# Patient Record
Sex: Female | Born: 1975 | Race: White | Hispanic: No | Marital: Married | Smoking: Former smoker
Health system: Southern US, Community
[De-identification: ages and names within clinical notes are randomized; demographics above are authoritative.]

## PROBLEM LIST (undated history)

## (undated) DIAGNOSIS — K589 Irritable bowel syndrome without diarrhea: Secondary | ICD-10-CM

## (undated) DIAGNOSIS — E039 Hypothyroidism, unspecified: Secondary | ICD-10-CM

## (undated) DIAGNOSIS — K219 Gastro-esophageal reflux disease without esophagitis: Secondary | ICD-10-CM

## (undated) DIAGNOSIS — F329 Major depressive disorder, single episode, unspecified: Secondary | ICD-10-CM

## (undated) DIAGNOSIS — G43909 Migraine, unspecified, not intractable, without status migrainosus: Secondary | ICD-10-CM

## (undated) DIAGNOSIS — F32A Depression, unspecified: Secondary | ICD-10-CM

## (undated) DIAGNOSIS — F3289 Other specified depressive episodes: Secondary | ICD-10-CM

## (undated) DIAGNOSIS — F410 Panic disorder [episodic paroxysmal anxiety] without agoraphobia: Secondary | ICD-10-CM

## (undated) DIAGNOSIS — F419 Anxiety disorder, unspecified: Secondary | ICD-10-CM

## (undated) DIAGNOSIS — O24419 Gestational diabetes mellitus in pregnancy, unspecified control: Secondary | ICD-10-CM

## (undated) DIAGNOSIS — R1110 Vomiting, unspecified: Secondary | ICD-10-CM

## (undated) DIAGNOSIS — R197 Diarrhea, unspecified: Secondary | ICD-10-CM

## (undated) HISTORY — PX: APPENDECTOMY: SHX54

## (undated) HISTORY — DX: Vomiting, unspecified: R11.10

## (undated) HISTORY — DX: Gastro-esophageal reflux disease without esophagitis: K21.9

## (undated) HISTORY — DX: Irritable bowel syndrome, unspecified: K58.9

## (undated) HISTORY — DX: Major depressive disorder, single episode, unspecified: F32.9

## (undated) HISTORY — DX: Migraine, unspecified, not intractable, without status migrainosus: G43.909

## (undated) HISTORY — PX: WISDOM TOOTH EXTRACTION: SHX21

## (undated) HISTORY — DX: Panic disorder (episodic paroxysmal anxiety): F41.0

## (undated) HISTORY — DX: Other specified depressive episodes: F32.89

## (undated) HISTORY — PX: COLON SURGERY: SHX602

## (undated) HISTORY — DX: Anxiety disorder, unspecified: F41.9

## (undated) HISTORY — DX: Diarrhea, unspecified: R19.7

---

## 2000-06-30 ENCOUNTER — Inpatient Hospital Stay (HOSPITAL_COMMUNITY): Admission: AD | Admit: 2000-06-30 | Discharge: 2000-06-30 | Payer: Self-pay | Admitting: *Deleted

## 2000-06-30 ENCOUNTER — Encounter: Payer: Self-pay | Admitting: *Deleted

## 2001-04-03 ENCOUNTER — Emergency Department (HOSPITAL_COMMUNITY): Admission: EM | Admit: 2001-04-03 | Discharge: 2001-04-03 | Payer: Self-pay | Admitting: Family Medicine

## 2001-04-03 ENCOUNTER — Encounter: Payer: Self-pay | Admitting: Emergency Medicine

## 2001-08-16 ENCOUNTER — Other Ambulatory Visit: Admission: RE | Admit: 2001-08-16 | Discharge: 2001-08-16 | Payer: Self-pay | Admitting: *Deleted

## 2002-10-13 ENCOUNTER — Other Ambulatory Visit: Admission: RE | Admit: 2002-10-13 | Discharge: 2002-10-13 | Payer: Self-pay | Admitting: *Deleted

## 2003-12-18 ENCOUNTER — Ambulatory Visit (HOSPITAL_COMMUNITY): Admission: RE | Admit: 2003-12-18 | Discharge: 2003-12-18 | Payer: Self-pay | Admitting: Internal Medicine

## 2004-02-22 ENCOUNTER — Encounter (INDEPENDENT_AMBULATORY_CARE_PROVIDER_SITE_OTHER): Payer: Self-pay | Admitting: *Deleted

## 2004-02-23 ENCOUNTER — Encounter (INDEPENDENT_AMBULATORY_CARE_PROVIDER_SITE_OTHER): Payer: Self-pay | Admitting: Specialist

## 2004-02-23 ENCOUNTER — Encounter (INDEPENDENT_AMBULATORY_CARE_PROVIDER_SITE_OTHER): Payer: Self-pay | Admitting: *Deleted

## 2004-02-23 ENCOUNTER — Observation Stay (HOSPITAL_COMMUNITY): Admission: EM | Admit: 2004-02-23 | Discharge: 2004-02-23 | Payer: Self-pay | Admitting: Emergency Medicine

## 2004-11-20 ENCOUNTER — Other Ambulatory Visit: Admission: RE | Admit: 2004-11-20 | Discharge: 2004-11-20 | Payer: Self-pay | Admitting: Obstetrics and Gynecology

## 2009-09-01 HISTORY — PX: COLONOSCOPY: SHX174

## 2010-04-03 ENCOUNTER — Encounter (INDEPENDENT_AMBULATORY_CARE_PROVIDER_SITE_OTHER): Payer: Self-pay | Admitting: *Deleted

## 2010-04-26 ENCOUNTER — Encounter (INDEPENDENT_AMBULATORY_CARE_PROVIDER_SITE_OTHER): Payer: Self-pay | Admitting: *Deleted

## 2010-04-29 ENCOUNTER — Encounter (INDEPENDENT_AMBULATORY_CARE_PROVIDER_SITE_OTHER): Payer: Self-pay | Admitting: *Deleted

## 2010-04-30 ENCOUNTER — Encounter (INDEPENDENT_AMBULATORY_CARE_PROVIDER_SITE_OTHER): Payer: Self-pay | Admitting: *Deleted

## 2010-05-08 ENCOUNTER — Encounter (INDEPENDENT_AMBULATORY_CARE_PROVIDER_SITE_OTHER): Payer: Self-pay | Admitting: *Deleted

## 2010-05-17 DIAGNOSIS — R1110 Vomiting, unspecified: Secondary | ICD-10-CM | POA: Insufficient documentation

## 2010-05-17 DIAGNOSIS — F329 Major depressive disorder, single episode, unspecified: Secondary | ICD-10-CM | POA: Insufficient documentation

## 2010-05-17 DIAGNOSIS — F410 Panic disorder [episodic paroxysmal anxiety] without agoraphobia: Secondary | ICD-10-CM | POA: Insufficient documentation

## 2010-05-17 DIAGNOSIS — K589 Irritable bowel syndrome without diarrhea: Secondary | ICD-10-CM | POA: Insufficient documentation

## 2010-05-17 DIAGNOSIS — R197 Diarrhea, unspecified: Secondary | ICD-10-CM | POA: Insufficient documentation

## 2010-05-17 DIAGNOSIS — K219 Gastro-esophageal reflux disease without esophagitis: Secondary | ICD-10-CM | POA: Insufficient documentation

## 2010-05-17 DIAGNOSIS — F3289 Other specified depressive episodes: Secondary | ICD-10-CM | POA: Insufficient documentation

## 2010-05-20 ENCOUNTER — Ambulatory Visit: Payer: Self-pay | Admitting: Internal Medicine

## 2010-05-21 ENCOUNTER — Telehealth: Payer: Self-pay | Admitting: Internal Medicine

## 2010-05-21 LAB — CONVERTED CEMR LAB
Sed Rate: 9 mm/hr (ref 0–22)
TSH: 1.71 microintl units/mL (ref 0.35–5.50)
Tissue Transglutaminase Ab, IgA: 10.3 units (ref <20)

## 2010-05-23 ENCOUNTER — Ambulatory Visit: Payer: Self-pay | Admitting: Internal Medicine

## 2010-05-28 ENCOUNTER — Encounter: Payer: Self-pay | Admitting: Internal Medicine

## 2010-06-03 ENCOUNTER — Telehealth: Payer: Self-pay | Admitting: Internal Medicine

## 2010-06-11 ENCOUNTER — Telehealth: Payer: Self-pay | Admitting: Internal Medicine

## 2010-08-07 ENCOUNTER — Ambulatory Visit: Payer: Self-pay | Admitting: Internal Medicine

## 2010-08-07 DIAGNOSIS — G43909 Migraine, unspecified, not intractable, without status migrainosus: Secondary | ICD-10-CM | POA: Insufficient documentation

## 2010-08-07 DIAGNOSIS — R740 Nonspecific elevation of levels of transaminase and lactic acid dehydrogenase [LDH]: Secondary | ICD-10-CM

## 2010-08-07 DIAGNOSIS — R7402 Elevation of levels of lactic acid dehydrogenase (LDH): Secondary | ICD-10-CM | POA: Insufficient documentation

## 2010-08-08 ENCOUNTER — Telehealth: Payer: Self-pay | Admitting: Internal Medicine

## 2010-08-08 ENCOUNTER — Telehealth (INDEPENDENT_AMBULATORY_CARE_PROVIDER_SITE_OTHER): Payer: Self-pay | Admitting: *Deleted

## 2010-08-08 LAB — CONVERTED CEMR LAB
ALT: 53 units/L — ABNORMAL HIGH (ref 0–35)
AST: 44 units/L — ABNORMAL HIGH (ref 0–37)
Albumin: 4.2 g/dL (ref 3.5–5.2)
Alkaline Phosphatase: 96 units/L (ref 39–117)
BUN: 7 mg/dL (ref 6–23)
Basophils Absolute: 0 10*3/uL (ref 0.0–0.1)
Basophils Relative: 0.5 % (ref 0.0–3.0)
CO2: 30 meq/L (ref 19–32)
Calcium: 9.8 mg/dL (ref 8.4–10.5)
Chloride: 99 meq/L (ref 96–112)
Creatinine, Ser: 0.6 mg/dL (ref 0.4–1.2)
Eosinophils Absolute: 0.2 10*3/uL (ref 0.0–0.7)
Eosinophils Relative: 2.1 % (ref 0.0–5.0)
GFR calc non Af Amer: 126.76 mL/min (ref >60.00)
Glucose, Bld: 87 mg/dL (ref 70–99)
HCT: 39.3 % (ref 36.0–46.0)
Hemoglobin: 13.4 g/dL (ref 12.0–15.0)
Lymphocytes Relative: 34.2 % (ref 12.0–46.0)
Lymphs Abs: 2.7 10*3/uL (ref 0.7–4.0)
MCHC: 34.2 g/dL (ref 30.0–36.0)
MCV: 97 fL (ref 78.0–100.0)
Monocytes Absolute: 0.6 10*3/uL (ref 0.1–1.0)
Monocytes Relative: 7.7 % (ref 3.0–12.0)
Neutro Abs: 4.4 10*3/uL (ref 1.4–7.7)
Neutrophils Relative %: 55.5 % (ref 43.0–77.0)
Platelets: 321 10*3/uL (ref 150.0–400.0)
Potassium: 4.6 meq/L (ref 3.5–5.1)
RBC: 4.05 M/uL (ref 3.87–5.11)
RDW: 12.8 % (ref 11.5–14.6)
Sed Rate: 10 mm/hr (ref 0–22)
Sodium: 137 meq/L (ref 135–145)
TSH: 6.12 microintl units/mL — ABNORMAL HIGH (ref 0.35–5.50)
Total Bilirubin: 0.3 mg/dL (ref 0.3–1.2)
Total Protein: 7.1 g/dL (ref 6.0–8.3)
WBC: 7.9 10*3/uL (ref 4.5–10.5)

## 2010-08-27 ENCOUNTER — Encounter: Payer: Self-pay | Admitting: Internal Medicine

## 2010-08-27 LAB — CONVERTED CEMR LAB
Anti Nuclear Antibody(ANA): NEGATIVE
Ceruloplasmin: 24 mg/dL (ref 21–63)
HCV Ab: NEGATIVE
Hep B Core Total Ab: NEGATIVE
Hep B S Ab: NEGATIVE
Hepatitis B Surface Ag: NEGATIVE

## 2010-08-28 ENCOUNTER — Ambulatory Visit: Payer: Self-pay | Admitting: Internal Medicine

## 2010-08-29 LAB — CONVERTED CEMR LAB
ALT: 37 units/L — ABNORMAL HIGH (ref 0–35)
AST: 28 units/L (ref 0–37)
Albumin: 3.8 g/dL (ref 3.5–5.2)
Alkaline Phosphatase: 64 units/L (ref 39–117)
Bilirubin, Direct: 0.1 mg/dL (ref 0.0–0.3)
Ferritin: 164.1 ng/mL (ref 10.0–291.0)
TSH: 1.82 microintl units/mL (ref 0.35–5.50)
Total Bilirubin: 0.4 mg/dL (ref 0.3–1.2)
Total Protein: 6.5 g/dL (ref 6.0–8.3)

## 2010-10-03 NOTE — Letter (Signed)
Summary: [facility]   Imported By: Lamona Curl CMA (AAMA) 05/17/2010 09:50:38  _____________________________________________________________________  External Attachment:    Type:   Image     Comment:   External Document

## 2010-10-03 NOTE — Letter (Signed)
Summary: [facility] Instructions  [facility]  [address] [place], [place] [address]   Phone: [phone]  Fax: [phone]       Lori Marshall    [birth year]    MRN: [id number]       Procedure Day /Date: 05/23/10 Thursday     Arrival Time: 2:30 pm     Procedure Time: 3:30 pm     Location of Procedure:                    _ x_  [facility] (4th Floor)  PREPARATION FOR COLONOSCOPY WITH MIRALAX  Starting 5 days prior to your procedure (05/19/10) do not eat nuts, seeds, popcorn, corn, beans, peas,  salads, or any raw vegetables.  Do not take any fiber supplements (e.g. Metamucil, Citrucel, and Benefiber). ____________________________________________________________________________________________________   THE DAY BEFORE YOUR PROCEDURE         DATE: 05/22/10 DAY: Wednesday  1   Drink clear liquids the entire day-NO SOLID FOOD  2   Do not drink anything colored red or purple.  Avoid juices with pulp.  No orange juice.  3   Drink at least 64 oz. (8 glasses) of fluid/clear liquids during the day to prevent dehydration and help the prep work efficiently.  CLEAR LIQUIDS INCLUDE: Water Jello Ice Popsicles Tea (sugar ok, no milk/cream) Powdered fruit flavored drinks Coffee (sugar ok, no milk/cream) Gatorade Juice: apple, white grape, white cranberry  Lemonade Clear bullion, consomm, broth Carbonated beverages (any kind) Strained chicken noodle soup Hard Candy  4   Mix the entire bottle of Miralax with 64 oz. of Gatorade/Powerade in the morning and put in the refrigerator to chill.  5   At 3:00 pm take 2 Dulcolax/Bisacodyl tablets.  6   At 4:30 pm take one Reglan/Metoclopramide tablet.  7  Starting at 5:00 pm drink one 8 oz glass of the Miralax mixture every 15-20 minutes until you have finished drinking the entire 64 oz.  You should finish drinking prep around 7:30 or 8:00 pm.  8   If you are nauseated, you may take the 2nd Reglan/Metoclopramide  tablet at 6:30 pm.        9    At 8:00 pm take 2 more DULCOLAX/Bisacodyl tablets.        THE DAY OF YOUR PROCEDURE      DATE:  05/23/10 DAY: Thursday  You may drink clear liquids until 1:30 pm  (2 HOURS BEFORE PROCEDURE).   MEDICATION INSTRUCTIONS  Unless otherwise instructed, you should take regular prescription medications with a small sip of water as early as possible the morning of your procedure.       OTHER INSTRUCTIONS  You will need a responsible adult at least 34 years of age to accompany you and drive you home.   This person must remain in the waiting room during your procedure.  Wear loose fitting clothing that is easily removed.  Leave jewelry and other valuables at home.  However, you may wish to bring a book to read or an iPod/MP3 player to listen to music as you wait for your procedure to start.  Remove all body piercing jewelry and leave at home.  Total time from sign-in until discharge is approximately 2-3 hours.  You should go home directly after your procedure and rest.  You can resume normal activities the day after your procedure.  The day of your procedure you should not:   Drive   Make legal  decisions   Operate machinery   Drink alcohol   Return to work  You will receive specific instructions about eating, activities and medications before you leave.   The above instructions have been reviewed and explained to me by   _______________________    I fully understand and can verbalize these instructions _____________________________ Date 05/20/10

## 2010-10-03 NOTE — Miscellaneous (Signed)
Summary: LIBRAX ORDER  Clinical Lists Changes  Medications: Added new medication of LIBRAX 2.5-5 MG  CAPS (CLIDINIUM-CHLORDIAZEPOXIDE) ONE P.O.TID AC IN PLACE OF LEVBID - Signed Rx of LIBRAX 2.5-5 MG  CAPS (CLIDINIUM-CHLORDIAZEPOXIDE) ONE P.O.TID AC IN PLACE OF LEVBID;  #90 x 2;  Signed;  Entered by: Alease Frame RN;  Authorized by: Hart Carwin MD;  Method used: Electronically to [facility]. #[id number]*, [address] [place], [place], [place]  [address], Ph: [phone], Fax: [phone] Observations: Added new observation of ALLERGY REV: Done (05/23/2010 14:50) Added new observation of NKA: T (05/23/2010 14:50)    Prescriptions: LIBRAX 2.5-5 MG  CAPS (CLIDINIUM-CHLORDIAZEPOXIDE) ONE P.O.TID AC IN PLACE OF LEVBID  #90 x 2   Entered by:   Alease Frame RN   Authorized by:   Hart Carwin MD   Signed by:   Alease Frame RN on 05/23/2010   Method used:   Electronically to        [facility]. [phone]* (retail)       [address]       [place], [place]  [address]       Ph: [phone]       Fax: [phone]   RxID:   [id number]

## 2010-10-03 NOTE — Progress Notes (Signed)
Summary: Symptom update.  Phone Note Outgoing Call Call back at [facility] Phone [phone]   Call placed by: Darcey Nora RN, CGRN,  June 11, 2010 8:36 AM Call placed to: Patient Summary of Call: Patient  called Dr Arlyce Dice at 5:00 am for abdominal pain.  He advised her to take Librax .  He asked me to call her back and see how she is doing.  Patient states that she has also had vomiting this am.  Patient  states her great grandmothers funeral ws last night and another family Laurence Slate has been diagnosed with a terminal disease and she was very upset.  I have asked the patient to continue on a clear liquid diet slowly advance as tolerated.  She is also advised to continue librax as ordered and recommended by Dr Arlyce Dice this am.  She is asked to kep her appointment with Dr Juanda Chance on 07/02/10. Initial call taken by: Darcey Nora RN, CGRN,  June 11, 2010 9:16 AM  Follow-up for Phone Call        reviewed and agree. Follow-up by: Hart Carwin MD,  June 11, 2010 10:11 PM

## 2010-10-03 NOTE — Letter (Signed)
Summary: Patient Notice- Colon Biospy Results  [facility]  [address] [place], [place] [address]   Phone: [phone]  Fax: [phone]        May 28, 2010 MRN: [id number]    [facility] [address] [place], [place]  [address]    Dear Ms. PARKER,  I am pleased to inform you that the biopsies taken during your recent colonoscopy did not show any evidence of cancer upon pathologic examination.The biopsies did not show any evidence of colitis. Your diarrhea is likely  a result of an Irritable bowl syndrome.  Additional information/recommendations:  __No further action is needed at this time.  Please follow-up with      your primary care physician for your other healthcare needs.  _x_Please call [phone] to schedule a return visit to review      your condition.  _x_Continue with the treatment plan as outlined on the day of your      exam.  _   Please call us if you are having persistent problems or have questions about your condition that have not been fully answered at this time.  Sincerely,  Hart Carwin MD   This letter has been electronically signed by your physician.  Appended Document: Patient Notice- Colon Biospy Results letter mailed

## 2010-10-03 NOTE — Assessment & Plan Note (Signed)
Summary: f/u after colonoscopy/dn   History of Present Illness Visit Type: Follow-up Visit Primary GI MD: Lina Sar MD Primary Provider: Lucky Cowboy, MD Requesting Provider: na Chief Complaint: F/u from colon. Pt states that she is having lower abd pain. Pt has nausea but due to severe headaches  History of Present Illness:   This is a 35 year old white female with severe diarrhea and suspected inflammatory bowel disease with a normal colonoscopy and random biopsies of the colon on 05/23/10, showing normal colonic mucosa without evidence of crypt abscess or microscopic colitis. She has been somewhat improved on antispasmodics; Levbid  and Librax. She does not take it regularly. She has had only 3 accidents since her exam 2 months ago. She also has been able to gain about 10 pounds. Her stool studies as well as her tissue transglutaminase levels, sedimentation rate and TSH were normal. She was empirically tried on Flagyl without improvement. She has a history of anxiety.   GI Review of Systems    Reports abdominal pain and  nausea.     Location of  Abdominal pain: lower abdomen.    Denies acid reflux, belching, bloating, chest pain, dysphagia with liquids, dysphagia with solids, heartburn, loss of appetite, vomiting, vomiting blood, weight loss, and  weight gain.        Denies anal fissure, black tarry stools, change in bowel habit, constipation, diarrhea, diverticulosis, fecal incontinence, heme positive stool, hemorrhoids, irritable bowel syndrome, jaundice, light color stool, liver problems, rectal bleeding, and  rectal pain.    Current Medications (verified): 1)  Omeprazole 40 Mg Cpdr (Omeprazole) .... Take 1 Tablet By Mouth Once A Day 2)  Klonopin 2 Mg Tabs (Clonazepam) .... Take As Directed 3)  Zoloft 100 Mg Tabs (Sertraline Hcl) .... Take 1 Tablet By Mouth Once Daily 4)  Hyomax-Dt 0.375 Mg Cr-Tabs (Hyoscyamine Sulfate) .... Take One By Mouth Two Times A Day 5)  Zovirax 800 Mg  Tabs (Acyclovir) .... Take As Needed 6)  Librax 2.5-5 Mg  Caps (Clidinium-Chlordiazepoxide) .... One P.o.tid Ac in Place of Levbid 7)  Fioricet 50-325-40 Mg Tabs (Butalbital-Apap-Caffeine) .... One Tablet By Mouth Two Times A Day 8)  Prochlorperazine Maleate 10 Mg Tabs (Prochlorperazine Maleate) .... One or Two Tablets By Mouth Once Daily  Allergies (verified): No Known Drug Allergies  Past History:  Past Medical History: MIGRAINE HEADACHE (ICD-346.90) VOMITING (ICD-787.03) IRRITABLE BOWEL SYNDROME (ICD-564.1) GERD (ICD-530.81) DIARRHEA (ICD-787.91) DEPRESSION (ICD-311) PANIC DISORDER (ICD-300.01)  Past Surgical History: Reviewed history from 05/20/2010 and no changes required. Wisdom Teeth Extraction Appendectomy  Family History: Reviewed history from 05/20/2010 and no changes required. Family History of Diabetes: Father Father has melanomia No FH of Colon Cancer: Family History of Colon Polyps: father  Social History: Reviewed history from 05/20/2010 and no changes required. Patient currently smokes. socially Alcohol Use - yes 2 glasses of wine with dinner and beers on the weekend Illicit Drug Use - no Occupation: insurance agent  Review of Systems       The patient complains of headaches-new.  The patient denies allergy/sinus, anemia, anxiety-new, arthritis/joint pain, back pain, blood in urine, breast changes/lumps, change in vision, confusion, cough, coughing up blood, depression-new, fainting, fatigue, fever, hearing problems, heart murmur, heart rhythm changes, itching, menstrual pain, muscle pains/cramps, night sweats, nosebleeds, pregnancy symptoms, shortness of breath, skin rash, sleeping problems, sore throat, swelling of feet/legs, swollen lymph glands, thirst - excessive , urination - excessive , urination changes/pain, urine leakage, vision changes, and voice change.  Pertinent positive and negative review of systems were noted in the above HPI. All  other ROS was otherwise negative.   Vital Signs:  Patient profile:   35 year old female Height:      66 inches Weight:      142 pounds BMI:     23.00 BSA:     1.73 Pulse rate:   60 / minute Pulse rhythm:   regular BP sitting:   98 / 60  (left arm) Cuff size:   regular  Vitals Entered By: Ok Anis CMA (August 07, 2010 10:42 AM)  Physical Exam  General:  Well developed, well nourished, no acute distress. Eyes:  PERRLA, no icterus. Mouth:  No deformity or lesions, dentition normal. Neck:  Supple; no masses or thyromegaly. Lungs:  Clear throughout to auscultation. Heart:  Regular rate and rhythm; no murmurs, rubs,  or bruits. Abdomen:  soft diffusely tender abdomen more so in the right lower quadrant and epigastrium. Normoactive bowel sounds. No distention. No palpable mass. Rectal:  soft Hemoccult negative stool. Extremities:  No clubbing, cyanosis, edema or deformities noted. Skin:  she had a heating pad marks on her abdomen   Impression & Recommendations:  Problem # 1:  IRRITABLE BOWEL SYNDROME (ICD-564.1) Patient has severe irritable bowel syndrome only partially responsive to antispasmodics which she does not take on a regular basis. I asked her to take her Librax 3 times a day every day. We will repeat  lab work today. Overall, she is improved but I am concerned that her underlying problem is severe anxiety and that she may need  stronger psychotropic agents to control it. Orders: TLB-CBC Platelet - w/Differential (85025-CBCD) TLB-CMP (Comprehensive Metabolic Pnl) (80053-COMP) TLB-TSH (Thyroid Stimulating Hormone) (84443-TSH) TLB-Sedimentation Rate (ESR) (85652-ESR)  Problem # 2:  VOMITING (ICD-787.03) This is not an active problem.  Patient Instructions: 1)  Your physician requests that you go to the basement floor of our office to have the following labwork completed before leaving today: CBC, Sed rate, TSH, CMET. 2)  Take Librax one capsule 3 times a day before  each meal on a regular basis. 3)  Office visit 3 months. 4)  Copy sent to : Lucky Cowboy, MD 5)  The medication list was reviewed and reconciled.  All changed / newly prescribed medications were explained.  A complete medication list was provided to the patient / caregiver.

## 2010-10-03 NOTE — Progress Notes (Signed)
----   Converted from flag ---- ---- 08/07/2010 6:00 PM, Hart Carwin MD wrote: Elberta Fortis, ( not by PCR). Hep B surface antigen and antibody,  anti Hep B core antibody,, . You may also add ceruloplasmin, ferriti n, ANA, antiSM antib., Thanx  ---- 08/07/2010 3:17 PM, Jesse Fall RN wrote: When you ask me to order Hepatitis serology are these the tests you want me to order?Alpha l Antitrypsin, AMA, ANA, anti SMA, Ceruloplasmin, Hepatitis B Surface Antibody, Hepatitis B Suraface Anitgen, Hepatitis C Anti HCV and Hepatitis A Antibody Total. Please, advise Thanks,  Shanequa Whitenight ------------------------------

## 2010-10-03 NOTE — Letter (Signed)
Summary: [facility]   Imported By: Lamona Curl CMA (AAMA) 05/17/2010 09:48:10  _____________________________________________________________________  External Attachment:    Type:   Image     Comment:   External Document

## 2010-10-03 NOTE — Progress Notes (Signed)
Summary: Medication  Phone Note Call from Patient Call back at Home Phone [phone]   Caller: Patient Call For: Dr. Juanda Chance Reason for Call: Talk to Nurse Summary of Call: Flagyl is causing her nausea and vomiting...wants to know if something else can be prescribed Initial call taken by: Karna Christmas,  June 03, 2010 2:53 PM  Follow-up for Phone Call        Dr Juanda Chance- I asked patient why she is still taking Flagyl (she was only supposed to be taking it x 10 days). She states that it made her "too sick." She still has 10 pills left. I have advised her that at this point, she may not even need to take it since her biopsies came back okay. I have told her that I would consult you for your advice on what she needs to do. Follow-up by: Lamona Curl CMA Duncan Dull),  June 03, 2010 4:27 PM  Additional Follow-up for Phone Call Additional follow up Details #1::        Advised patient that per Dr Regino Schultze verbal orders, she can now discontinue the flagyl as her biopsies were negative for any inflammation. Patient verbalizes understanding and has also scheduled a follow up appointment to discuss her condition. She states that the librax is really helping her. Additional Follow-up by: Lamona Curl CMA Duncan Dull),  June 04, 2010 8:24 AM

## 2010-10-03 NOTE — Op Note (Signed)
Summary: Appendicitis   NAME:  Lori Marshall, Lori Marshall                       ACCOUNT NO.:  [id number]   MEDICAL RECORD NO.:  [id number]                   PATIENT TYPE:  INP   LOCATION:  0450                                 FACILITY:  [facility]   PHYSICIAN:  Angelia Mould. Derrell Lolling, M.D.             DATE OF BIRTH:  [birth year]   DATE OF PROCEDURE:  02/23/2004  DATE OF DISCHARGE:                                 OPERATIVE REPORT   PREOPERATIVE DIAGNOSIS:  Acute appendicitis.   POSTOPERATIVE DIAGNOSIS:  Acute appendicitis.   OPERATION PERFORMED:  Laparoscopic appendectomy.   SURGEON:  Dr. Claud Kelp   OPERATIVE INDICATIONS:  This is a 35 year old white female, who presents  with a 24-36 hour history of abdominal pain.  This has become worse and is  more localized to the right side of the abdomen.  She was evaluated in the  emergency room.  Exam reveals somewhat diffuse abdominal tenderness but  clearly more so on the right side.  White blood cell count is 13,000.  Urine  pregnancy test is negative.  Urinalysis is normal.  CT scan suggests a  thickened appendix consistent with early appendicitis and no other  abnormalities seen.  She was brought to the operating room urgently.   OPERATIVE FINDINGS:  The appendix was inflamed and somewhat thickened,  especially in its distal part.  There was no gangrene.  There was no  perforation.  The terminal ileum looked normal.  The cecum looked normal.  There was a little bit of cloudy fluid in the pelvis, but both fallopian  tubes and both ovaries looked fine.  There was no exudate.  Gallbladder and  liver looked normal.   OPERATIVE TECHNIQUE:  Following the induction of general endotracheal  anesthesia, the patient's abdomen was prepped and draped in a sterile  fashion.  A Foley catheter had been previously inserted.  Marcaine 0.5% with  epinephrine was used as a local infiltration anesthetic.  Transverse  incision was made above the umbilicus.  Fascia  was incised transversely and  the abdominal cavity entered under direct vision.  A 10 mm Hasson trocar was  inserted and secured with a pursestring suture of 0 Vicryl.  Pneumoperitoneum was created.  Video camera was inserted with visualization  and findings as described above.  A 5 mm trocar was placed in the right  upper quadrant and a 12 mm trocar in the left suprapubic area.  The patient  was positioned in Trendelenburg position and tilted to the left.  After a  careful survey of the abdomen and pelvis, we had lifted the appendix up.  We  divided the appendiceal artery and the mesoappendix with the harmonic  scalpel.  We took this dissection all the way back to the point where we  could clearly identify the insertion of the appendix onto the cecum.  The  appendix was transected at the level of the cecum using an EndoGIA  stapling  device.  The appendix was placed in a specimen bag and removed.  We  irrigated out the pelvis and the operative field.  Inspection reveals once  again there was no bleeding.  The staple line in the cecum looked very good.  No bleeding and no problems there.  Trocars were removed under direct  vision.  There was no bleeding from the trocar sites.  The pneumoperitoneum  was released.  The fascia at the umbilicus and the fascia in the left  suprapubic trocar site were closed with 0 Vicryl sutures.  The incisions  were irrigated with saline and the  skin closed with subcuticular sutures of 4-0 Vicryl and Steri-Strips.  Clean  bandages were placed and the patient taken to the recovery room in stable  condition.  Estimated blood loss was about 10 mL.  Complications none.  Sponge, needle, and instrument counts were correct.                                               Angelia Mould. Derrell Lolling, M.D.    HMI/MEDQ  D:  02/23/2004  T:  02/23/2004  Job:  [id number]   cc:   Lucky Cowboy, M.D.  [address]  [place], [place] [address]  Fax: [phone]

## 2010-10-03 NOTE — Progress Notes (Signed)
----   Converted from flag ---- ---- 08/07/2010 6:00 PM, Hart Carwin MD wrote: Elberta Fortis, ( not by PCR). Hep B surface antigen and antibody,  anti Hep B core antibody,, . You may also add ceruloplasmin, ferriti n, ANA, antiSM antib., Thanx  ---- 08/07/2010 3:17 PM, Jesse Fall RN wrote: When you ask me to order Hepatitis serology are these the tests you want me to order?Alpha l Antitrypsin, AMA, ANA, anti SMA, Ceruloplasmin, Hepatitis B Surface Antibody, Hepatitis B Suraface Anitgen, Hepatitis C Anti HCV and Hepatitis A Antibody Total. Please, advise Thanks,  Regina ------------------------------  Labs ordered in [facility] for 08/28/10.

## 2010-10-03 NOTE — Assessment & Plan Note (Signed)
Summary: vomiting/diarrhea...as.   History of Present Illness Visit Type: Initial Consult Primary GI MD: Lina Sar MD Primary Provider: Lucky Cowboy, MD Requesting Provider: Lucky Cowboy, MD Chief Complaint: Patient complains of diarrhea  she states that she has 4-20 BMs per day. She also complains of generalized abdominal pain. Her symptoms started about a month ago and denies any antibiotics recently. The diarrhea has been since she started Klonopin and Zoloft.  History of Present Illness:   This is a 35 year old white female with 6 weeks of diarrhea and loose stools 8-10 times a day and throughout the night. There was one episode of bleeding. She has crampy lower abdominal pain and occasional nausea and vomiting. She has lost 12 pounds from 148-136 pounds. There has been no fever. Stool cultures, O&P and C. difficile being negative. All blood tests have been normal. There is no family history of inflammatory bowel disease. She  traveled to [place] four weeks ago. She had fever blisters treated with Zovirax at the end of June 2011. There have been no antibiotics used. She started on Zoloft 100 mg a day approximately 2 months ago for depression. Dr Oneta Rack asked her to  stop taking Zoloft since it may be causing diarrhea, and start Prozac 40 mg  instead.   GI Review of Systems    Reports abdominal pain, acid reflux, bloating, and  loss of appetite.     Location of  Abdominal pain: generalized.    Denies belching, chest pain, dysphagia with liquids, dysphagia with solids, heartburn, nausea, vomiting, vomiting blood, weight loss, and  weight gain.      Reports change in bowel habits and  diarrhea.     Denies anal fissure, black tarry stools, constipation, diverticulosis, fecal incontinence, heme positive stool, hemorrhoids, irritable bowel syndrome, jaundice, light color stool, liver problems, rectal bleeding, and  rectal pain.    Current Medications (verified): 1)  Omeprazole 40 Mg Cpdr  (Omeprazole) .... Take 1 Tablet By Mouth Once A Day 2)  Klonopin 2 Mg Tabs (Clonazepam) .... Take As Directed 3)  Zoloft 100 Mg Tabs (Sertraline Hcl) .... Take 1 Tablet By Mouth Once Daily 4)  Hyomax-Dt 0.375 Mg Cr-Tabs (Hyoscyamine Sulfate) .... Take One By Mouth Two Times A Day 5)  Zovirax 800 Mg Tabs (Acyclovir) .... Take As Needed  Allergies (verified): No Known Drug Allergies  Past History:  Past Medical History: Reviewed history from 05/17/2010 and no changes required. Current Problems:  VOMITING (ICD-787.03) IRRITABLE BOWEL SYNDROME (ICD-564.1) GERD (ICD-530.81) DIARRHEA (ICD-787.91) DEPRESSION (ICD-311) PANIC DISORDER (ICD-300.01)    Past Surgical History: Wisdom Teeth Extraction Appendectomy  Family History: Family History of Diabetes: Father Father has melanomia No FH of Colon Cancer: Family History of Colon Polyps: father  Social History: Patient currently smokes. socially Alcohol Use - yes 2 glasses of wine with dinner and beers on the weekend Illicit Drug Use - no Occupation: insurance agent  Review of Systems       The patient complains of allergy/sinus, anxiety-new, arthritis/joint pain, back pain, depression-new, fatigue, menstrual pain, muscle pains/cramps, night sweats, and sleeping problems.  The patient denies anemia, blood in urine, breast changes/lumps, change in vision, confusion, cough, coughing up blood, fainting, fever, headaches-new, hearing problems, heart murmur, heart rhythm changes, itching, nosebleeds, pregnancy symptoms, shortness of breath, skin rash, sore throat, swelling of feet/legs, swollen lymph glands, thirst - excessive , urination - excessive , urination changes/pain, urine leakage, vision changes, and voice change.         Pertinent  positive and negative review of systems were noted in the above HPI. All other ROS was otherwise negative.   Vital Signs:  Patient profile:   35 year old female Height:      66 inches Weight:       136.8 pounds BMI:     22.16 Pulse rate:   62 / minute Pulse rhythm:   regular BP sitting:   98 / 60  (left arm) Cuff size:   regular  Vitals Entered By: Harlow Mares CMA Duncan Dull) (May 20, 2010 1:56 PM)  Physical Exam  General:  Well developed, well nourished, no acute distress. Eyes:  PERRLA, no icterus. Mouth:  no aphthous ulcers. Tongue normal. Neck:  no adenopathy. Lungs:  Clear throughout to auscultation. Heart:  Regular rate and rhythm; no murmurs, rubs,  or bruits. Abdomen:  soft, diffusely tender abdomen more so in the left lower quadrant and periumbilical area. Decreased bowel sounds. Liver edge at costal margin. No rebound. Rectal:  normal rectal sphincter tone, soft Hemoccult negative stool. Extremities:  No clubbing, cyanosis, edema or deformities noted. Neurologic:  Alert and oriented x4; grossly normal neurologically. Skin:  Intact without significant lesions or rashes. Psych:  Alert and cooperative. Normal mood and affect.   Impression & Recommendations:  Problem # 1:  DIARRHEA (ICD-787.91) Frequent heme negative stools even nocturnally suggest organic ethiology rather than an IBS/diarrhea. Also her weight loss is significant but her blood tests have been normal. We need to r/o drug related diarrhea ( Zoloft), IBD, micrscopic colitis or infectious colitis ( despite negative stool studies)). Schedule colonoscopy. Start Flagyl empirically  Orders: Colonoscopy (Colon) T-Tissue Transglutamase Ab IgA ([id number]) TLB-Sedimentation Rate (ESR) (85652-ESR) TLB-TSH (Thyroid Stimulating Hormone) (84443-TSH)  Problem # 2:  VOMITING (ICD-787.03)  This may be secondary to abdominal pain and acute illness.  Orders: T-Tissue Transglutamase Ab IgA [phone]) TLB-Sedimentation Rate (ESR) (85652-ESR) TLB-TSH (Thyroid Stimulating Hormone) (84443-TSH)  Problem # 3:  DEPRESSION (ICD-311) Patient's Zoloft has been discontinued. She has started on Prozac 40 mg daily. I  asked her to hold Prozac until the completion of the colonoscopy.  Problem # 4:  DEPRESSION (ICD-311) Hold Zoloft. Start  Prozac after colonoscopy.  Patient Instructions: 1)  Colonoscopy with random biopsies. Will hold Prozac until we complete the colonoscopy. 2)  Flagyl 250 mg p.o. t.i.d. 3)  TSH, sedimentation rate and tissue transglutaminase. 4)  Continue antispasmodic Levbid 5)  Copy sent to : Dr Oneta Rack 6)  The medication list was reviewed and reconciled.  All changed / newly prescribed medications were explained.  A complete medication list was provided to the patient / caregiver. Prescriptions: FLAGYL 250 MG TABS (METRONIDAZOLE) Take 1 tablet by mouth three times a day x 10 days  #30 x 0   Entered by:   Lamona Curl CMA (AAMA)   Authorized by:   Hart Carwin MD   Signed by:   Lamona Curl CMA (AAMA) on 05/20/2010   Method used:   Electronically to        [facility]. [phone]* (retail)       [address]       [place], [place]  [address]       Ph: [phone]       Fax: [phone]   RxID:   [id number] DULCOLAX 5 MG  TBEC (BISACODYL) Day before procedure take 2 at 3pm and 2 at 8pm.  #4 x 0   Entered by:  Dottie Nelson-Smith CMA (AAMA)   Authorized by:   Hart Carwin MD   Signed by:   Lamona Curl CMA (AAMA) on 05/20/2010   Method used:   Electronically to        [facility]. [phone]* (retail)       [address]       [place], [place]  [address]       Ph: [phone]       Fax: [phone]   RxID:   [id number] REGLAN 10 MG  TABS (METOCLOPRAMIDE HCL) As per prep instructions.  #2 x 0   Entered by:   Lamona Curl CMA (AAMA)   Authorized by:   Hart Carwin MD   Signed by:   Lamona Curl CMA (AAMA) on 05/20/2010   Method used:   Electronically to        [facility]. [phone]* (retail)       [address]       [place], [place]  [address]       Ph: [phone]       Fax: [phone]   RxID:   [id number] MIRALAX   POWD (POLYETHYLENE GLYCOL 3350) As per prep  instructions.  #255gm x 0   Entered by:   Lamona Curl CMA (AAMA)   Authorized by:   Hart Carwin MD   Signed by:   Lamona Curl CMA (AAMA) on 05/20/2010   Method used:   Electronically to        [facility]. [phone]* (retail)       [address]       [place], [place]  [address]       Ph: [phone]       Fax: [phone]   RxID:   [id number]

## 2010-10-03 NOTE — Letter (Signed)
Summary: [facility]   Imported By: Lamona Curl CMA (AAMA) 05/17/2010 09:48:34  _____________________________________________________________________  External Attachment:    Type:   Image     Comment:   External Document

## 2010-10-03 NOTE — Progress Notes (Signed)
Summary: prep ?'s  Phone Note Call from Patient Call back at [facility] Phone [phone]   Caller: Patient Call For: Dr. Juanda Chance Reason for Call: Talk to Nurse Summary of Call: prep ?'s Initial call taken by: Vallarie Mare,  May 21, 2010 4:54 PM  Follow-up for Phone Call        All questions answered.  Pt wanted to change her time to earlier, so appt changed to 2:00 p.m. instead of 3:30 p.m.  No further questions at this time Follow-up by: Karl Bales RN,  May 21, 2010 5:07 PM

## 2010-10-03 NOTE — Letter (Signed)
Summary: [facility]   Imported By: Lamona Curl CMA (AAMA) 05/17/2010 09:49:04  _____________________________________________________________________  External Attachment:    Type:   Image     Comment:   External Document

## 2010-10-03 NOTE — Letter (Signed)
Summary: [facility]   Imported By: Lamona Curl CMA (AAMA) 05/17/2010 09:50:12  _____________________________________________________________________  External Attachment:    Type:   Image     Comment:   External Document

## 2010-10-03 NOTE — Procedures (Signed)
Summary: Colonoscopy  Patient: Lori Marshall Note: All result statuses are Final unless otherwise noted.  Tests: (1) Colonoscopy (COL)   COL Colonoscopy           DONE     [facility]     [address]     [place], [place]  [address]           COLONOSCOPY PROCEDURE REPORT           PATIENT:  Lori, Marshall  MR#:  [id number]     BIRTHDATE:  11/08/1975, 34 yrs. old  GENDER:  female     ENDOSCOPIST:  Hedwig Morton. Juanda Chance, MD     REF. BY:  Lucky Cowboy, M.D.     PROCEDURE DATE:  05/23/2010     PROCEDURE:  Colonoscopy 57[address]     ASA CLASS:  Class I     INDICATIONS:  unexplained diarrhea negative stool studies     also N     all labs normal, nocturnal stools, sed rate nl, tTG neg, TSH nl           MEDICATIONS:   Versed 12 mg, Fentanyl 100 mcg, Benadryl 50 mg           DESCRIPTION OF PROCEDURE:   After the risks benefits and     alternatives of the procedure were thoroughly explained, informed     consent was obtained.  Digital rectal exam was performed and     revealed no rectal masses.   The LB [id number] endoscope     was introduced through the anus and advanced to the cecum, which     was identified by both the appendix and ileocecal valve, without     limitations.  The quality of the prep was good, using MiraLax.     The instrument was then slowly withdrawn as the colon was fully     examined.     <<PROCEDUREIMAGES>>           FINDINGS:  No polyps or cancers were seen. unable to intubate TI,     normal appearing rectum and colon, Random biopsies were obtained     and sent to pathology (see image5, image4, image3, image2, and     image1). right and left colon biopsies  A sessile polyp was found.     2 mm diminutive polyp at 40 cm removed The polyp was removed using     cold biopsy forceps.   Retroflexed views in the rectum revealed no     abnormalities.    The scope was then withdrawn from the patient     and the procedure completed.           COMPLICATIONS:   None     ENDOSCOPIC IMPRESSION:     1) No polyps or cancers     2) Sessile polyp     s/p random biopsies of the right and the left colon, no evidence     of acute colitis     RECOMMENDATIONS:     1) Await biopsy results     finish Flagyl 250 mg po tid     Librax 1 po tid ac in place of Levbid     REPEAT EXAM:  In 0 year(s) for.           ______________________________     Hedwig Morton. Juanda Chance, MD           CC:  n.     eSIGNED:   Hedwig Morton. Areonna Bran at 05/23/2010 02:46 PM           Riley Kill, [id number]  Note: An exclamation mark (!) indicates a result that was not dispersed into the flowsheet. Document Creation Date: 05/23/2010 2:47 PM _______________________________________________________________________  (1) Order result status: Final Collection or observation date-time: 05/23/2010 14:30 Requested date-time:  Receipt date-time:  Reported date-time:  Referring Physician:   Ordering Physician: Lina Sar [phone]) Specimen Source:  Source: Launa Grill Order Number: [id number] Lab site:

## 2010-10-03 NOTE — Progress Notes (Signed)
Summary: Questions  Phone Note Call from Patient Call back at [phone]   Caller: Patient Call For: Dr. Juanda Chance Reason for Call: Talk to Nurse Summary of Call: Pt is expecting a call from you today about her lab work and what she needs to do about it, there are two good numbers to reach her at [phone] and [phone] Initial call taken by: [place] Johnson,  August 08, 2010 10:27 AM  Follow-up for Phone Call        Message left for patient to call back at both numbers. Jesse Fall RN  August 08, 2010 10:38 AM Spoke with patient re: f/u with Dr. Oneta Rack for [facility] and repeat labs on 08/28/10. Patient's number is [phone] Follow-up by: Jesse Fall RN,  August 08, 2010 2:18 PM

## 2010-11-25 ENCOUNTER — Other Ambulatory Visit (INDEPENDENT_AMBULATORY_CARE_PROVIDER_SITE_OTHER): Payer: Medicaid Other

## 2010-11-25 ENCOUNTER — Telehealth: Payer: Self-pay | Admitting: *Deleted

## 2010-11-25 ENCOUNTER — Other Ambulatory Visit: Payer: Self-pay

## 2010-11-25 ENCOUNTER — Other Ambulatory Visit: Payer: Self-pay | Admitting: *Deleted

## 2010-11-25 DIAGNOSIS — R7402 Elevation of levels of lactic acid dehydrogenase (LDH): Secondary | ICD-10-CM

## 2010-11-25 DIAGNOSIS — R740 Nonspecific elevation of levels of transaminase and lactic acid dehydrogenase [LDH]: Secondary | ICD-10-CM

## 2010-11-25 LAB — HEPATIC FUNCTION PANEL
ALT: 43 U/L — ABNORMAL HIGH (ref 0–35)
AST: 34 U/L (ref 0–37)
Albumin: 3.7 g/dL (ref 3.5–5.2)
Alkaline Phosphatase: 66 U/L (ref 39–117)
Bilirubin, Direct: 0.1 mg/dL (ref 0.0–0.3)
Total Bilirubin: 0.5 mg/dL (ref 0.3–1.2)
Total Protein: 6.5 g/dL (ref 6.0–8.3)

## 2010-11-25 NOTE — Telephone Encounter (Signed)
Left  Patient a message reminding patient to come for labs this week. Lori Marshall'

## 2010-11-26 ENCOUNTER — Telehealth: Payer: Self-pay | Admitting: *Deleted

## 2010-11-26 NOTE — Telephone Encounter (Signed)
Patient given results as per Dr. Brodie 

## 2010-11-26 NOTE — Telephone Encounter (Signed)
Message copied by Jesse Fall on Tue Nov 26, 2010  8:35 AM ------      Message from: Lina Sar      Created: Mon Nov 25, 2010  9:08 PM       Please call pt with improved in LFT's

## 2010-12-30 ENCOUNTER — Emergency Department (HOSPITAL_COMMUNITY)
Admission: EM | Admit: 2010-12-30 | Discharge: 2010-12-30 | Disposition: A | Discharge status: Home / Self Care | Payer: Medicaid Other | Attending: Emergency Medicine | Admitting: Emergency Medicine

## 2010-12-30 ENCOUNTER — Emergency Department (HOSPITAL_COMMUNITY): Payer: Medicaid Other

## 2010-12-30 DIAGNOSIS — S0003XA Contusion of scalp, initial encounter: Secondary | ICD-10-CM | POA: Insufficient documentation

## 2010-12-30 DIAGNOSIS — M542 Cervicalgia: Secondary | ICD-10-CM | POA: Insufficient documentation

## 2010-12-30 DIAGNOSIS — R404 Transient alteration of awareness: Secondary | ICD-10-CM | POA: Insufficient documentation

## 2010-12-30 DIAGNOSIS — K589 Irritable bowel syndrome without diarrhea: Secondary | ICD-10-CM | POA: Insufficient documentation

## 2010-12-30 DIAGNOSIS — R109 Unspecified abdominal pain: Secondary | ICD-10-CM | POA: Insufficient documentation

## 2010-12-30 DIAGNOSIS — S139XXA Sprain of joints and ligaments of unspecified parts of neck, initial encounter: Secondary | ICD-10-CM | POA: Insufficient documentation

## 2010-12-30 DIAGNOSIS — W108XXA Fall (on) (from) other stairs and steps, initial encounter: Secondary | ICD-10-CM | POA: Insufficient documentation

## 2010-12-30 DIAGNOSIS — F1010 Alcohol abuse, uncomplicated: Secondary | ICD-10-CM | POA: Insufficient documentation

## 2010-12-30 DIAGNOSIS — S0990XA Unspecified injury of head, initial encounter: Secondary | ICD-10-CM | POA: Insufficient documentation

## 2010-12-30 DIAGNOSIS — S1093XA Contusion of unspecified part of neck, initial encounter: Secondary | ICD-10-CM | POA: Insufficient documentation

## 2010-12-30 DIAGNOSIS — Z79899 Other long term (current) drug therapy: Secondary | ICD-10-CM | POA: Insufficient documentation

## 2010-12-30 DIAGNOSIS — F341 Dysthymic disorder: Secondary | ICD-10-CM | POA: Insufficient documentation

## 2010-12-30 LAB — DIFFERENTIAL
Basophils Absolute: 0 10*3/uL (ref 0.0–0.1)
Basophils Relative: 1 % (ref 0–1)
Eosinophils Absolute: 0.1 10*3/uL (ref 0.0–0.7)
Eosinophils Relative: 1 % (ref 0–5)
Lymphocytes Relative: 51 % — ABNORMAL HIGH (ref 12–46)
Lymphs Abs: 3.2 10*3/uL (ref 0.7–4.0)
Monocytes Absolute: 0.4 10*3/uL (ref 0.1–1.0)
Monocytes Relative: 7 % (ref 3–12)
Neutro Abs: 2.6 10*3/uL (ref 1.7–7.7)
Neutrophils Relative %: 41 % — ABNORMAL LOW (ref 43–77)

## 2010-12-30 LAB — RAPID URINE DRUG SCREEN, HOSP PERFORMED
Amphetamines: NOT DETECTED
Barbiturates: NOT DETECTED
Benzodiazepines: POSITIVE — AB
Cocaine: NOT DETECTED
Opiates: NOT DETECTED
Tetrahydrocannabinol: NOT DETECTED

## 2010-12-30 LAB — POCT I-STAT, CHEM 8
BUN: 10 mg/dL (ref 6–23)
Calcium, Ion: 1.02 mmol/L — ABNORMAL LOW (ref 1.12–1.32)
Chloride: 103 mEq/L (ref 96–112)
Creatinine, Ser: 1 mg/dL (ref 0.4–1.2)
Glucose, Bld: 111 mg/dL — ABNORMAL HIGH (ref 70–99)
HCT: 37 % (ref 36.0–46.0)
Hemoglobin: 12.6 g/dL (ref 12.0–15.0)
Potassium: 3.9 mEq/L (ref 3.5–5.1)
Sodium: 139 mEq/L (ref 135–145)
TCO2: 26 mmol/L (ref 0–100)

## 2010-12-30 LAB — CBC
HCT: 36.7 % (ref 36.0–46.0)
Hemoglobin: 12.7 g/dL (ref 12.0–15.0)
MCH: 32.1 pg (ref 26.0–34.0)
MCHC: 34.6 g/dL (ref 30.0–36.0)
MCV: 92.7 fL (ref 78.0–100.0)
Platelets: 276 10*3/uL (ref 150–400)
RBC: 3.96 MIL/uL (ref 3.87–5.11)
RDW: 12.3 % (ref 11.5–15.5)
WBC: 6.3 10*3/uL (ref 4.0–10.5)

## 2010-12-30 LAB — POCT PREGNANCY, URINE: Preg Test, Ur: NEGATIVE

## 2010-12-30 LAB — ETHANOL: Alcohol, Ethyl (B): 203 mg/dL — ABNORMAL HIGH (ref 0–10)

## 2010-12-30 MED ORDER — IOHEXOL 300 MG/ML  SOLN: 100.0000 mL | Freq: Once | INTRAMUSCULAR | Status: DC | PRN

## 2011-01-17 NOTE — Op Note (Signed)
NAME:  Lori Marshall, Lori Marshall                       ACCOUNT NO.:  [id number]   MEDICAL RECORD NO.:  [id number]                   PATIENT TYPE:  INP   LOCATION:  0450                                 FACILITY:  [facility]   PHYSICIAN:  Angelia Mould. Derrell Lolling, M.D.             DATE OF BIRTH:  [birth year]   DATE OF PROCEDURE:  02/23/2004  DATE OF DISCHARGE:                                 OPERATIVE REPORT   PREOPERATIVE DIAGNOSIS:  Acute appendicitis.   POSTOPERATIVE DIAGNOSIS:  Acute appendicitis.   OPERATION PERFORMED:  Laparoscopic appendectomy.   SURGEON:  Dr. Claud Kelp   OPERATIVE INDICATIONS:  This is a 35 year old white female, who presents  with a 24-36 hour history of abdominal pain.  This has become worse and is  more localized to the right side of the abdomen.  She was evaluated in the  emergency room.  Exam reveals somewhat diffuse abdominal tenderness but  clearly more so on the right side.  White blood cell count is 13,000.  Urine  pregnancy test is negative.  Urinalysis is normal.  CT scan suggests a  thickened appendix consistent with early appendicitis and no other  abnormalities seen.  She was brought to the operating room urgently.   OPERATIVE FINDINGS:  The appendix was inflamed and somewhat thickened,  especially in its distal part.  There was no gangrene.  There was no  perforation.  The terminal ileum looked normal.  The cecum looked normal.  There was a little bit of cloudy fluid in the pelvis, but both fallopian  tubes and both ovaries looked fine.  There was no exudate.  Gallbladder and  liver looked normal.   OPERATIVE TECHNIQUE:  Following the induction of general endotracheal  anesthesia, the patient's abdomen was prepped and draped in a sterile  fashion.  A Foley catheter had been previously inserted.  Marcaine 0.5% with  epinephrine was used as a local infiltration anesthetic.  Transverse  incision was made above the umbilicus.  Fascia was incised transversely  and  the abdominal cavity entered under direct vision.  A 10 mm Hasson trocar was  inserted and secured with a pursestring suture of 0 Vicryl.  Pneumoperitoneum was created.  Video camera was inserted with visualization  and findings as described above.  A 5 mm trocar was placed in the right  upper quadrant and a 12 mm trocar in the left suprapubic area.  The patient  was positioned in Trendelenburg position and tilted to the left.  After a  careful survey of the abdomen and pelvis, we had lifted the appendix up.  We  divided the appendiceal artery and the mesoappendix with the harmonic  scalpel.  We took this dissection all the way back to the point where we  could clearly identify the insertion of the appendix onto the cecum.  The  appendix was transected at the level of the cecum using an EndoGIA stapling  device.  The appendix was placed in a specimen bag and removed.  We  irrigated out the pelvis and the operative field.  Inspection reveals once  again there was no bleeding.  The staple line in the cecum looked very good.  No bleeding and no problems there.  Trocars were removed under direct  vision.  There was no bleeding from the trocar sites.  The pneumoperitoneum  was released.  The fascia at the umbilicus and the fascia in the left  suprapubic trocar site were closed with 0 Vicryl sutures.  The incisions  were irrigated with saline and the  skin closed with subcuticular sutures of 4-0 Vicryl and Steri-Strips.  Clean  bandages were placed and the patient taken to the recovery room in stable  condition.  Estimated blood loss was about 10 mL.  Complications none.  Sponge, needle, and instrument counts were correct.                                               Angelia Mould. Derrell Lolling, M.D.    HMI/MEDQ  D:  02/23/2004  T:  02/23/2004  Job:  [id number]   cc:   Lucky Cowboy, M.D.  [address]  [place], [place] [address]  Fax: [phone]

## 2011-01-17 NOTE — H&P (Signed)
NAME:  Lori Marshall, PANCHAL                       ACCOUNT NO.:  [id number]   MEDICAL RECORD NO.:  [id number]                   PATIENT TYPE:  INP   LOCATION:  0450                                 FACILITY:  [facility]   PHYSICIAN:  Angelia Mould. Derrell Lolling, M.D.             DATE OF BIRTH:  June 30, 1976   DATE OF ADMISSION:  02/22/2004  DATE OF DISCHARGE:                                HISTORY & PHYSICAL   CHIEF COMPLAINT:  Abdominal pain.   HISTORY OF PRESENT ILLNESS:  This is a 35 year old white female who was in  excellent health until Wednesday, February 21, 2004.  She awoke on that date  with right ear pain and a sore throat.  That got worse, and she had to leave  work early.  That evening, she developed diffuse abdominal pain but no  nausea, vomiting, no fever, and had a normal bowel movement.  On Thursday,  June 23, she said the abdominal pain was worse and seemed to be more of a  problem than her neck pain and sore throat, and the pain became more on the  right side.  She saw Dr. Oneta Rack, who thought she might have some type of  pharyngitis and gave her a prescription for that.  He also told her that he  was concerned that she might have appendicitis or an ovarian problem and  sent her to the emergency room for evaluation.  She got here at 3:30 p.m.  today.  I was called at 11:30 p.m. after she had a CAT scan that suggested  that she had appendicitis.   She has been anorexic today but really is not nauseated.  Her last menstrual  period was two weeks ago and was on schedule.  I think that it is likely  that she has appendicitis, although not certain.  She is going to be taken  to the operating room for laparoscopy.   PAST MEDICAL HISTORY:  She has had her wisdom teeth removed.  She has a  panic disorder.  Otherwise, no medical or surgical problems.   CURRENT MEDICATIONS:  1. Zoloft 100 mg a day.  2. Xanax 0.5 mg a day.   DRUG ALLERGIES:  None known.   SOCIAL HISTORY:  She lives in  [place].  She has been separated from her  husband for three months and that has been stressful for her.  She is  seeking counseling at the [facility] because of this.  She has no children.  She has been pregnant three times.  She has had two  therapeutic abortions and one miscarriage.  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  She smokes about one-half pack of cigarettes  a day.  She drinks alcohol about three times a week.   FAMILY HISTORY:  Mother living.  Has elevated lipids.  Father living.  Has  hypertension, borderline diabetes, and elevated cholesterol.  The  patient is  an only child.   REVIEW OF SYSTEMS:  All systems reviewed.  They are noncontributory except  as described above.   PHYSICAL EXAMINATION:  VITAL SIGNS:  Temperature 98.5, pulse 75,  respirations 18, blood pressure 119/79.  GENERAL:  A pleasant young woman who is alert and cooperative, but she is  anxious and tearful at times.  Her mother is here with her throughout the  encounter.  HEENT:  Eyes:  Sclerae are clear.  Extraocular movements are intact.  Ears,  nose, mouth, and throat:  Nose, lips, tongue, and oropharynx are without  gross lesions.  NECK:  She does have some bilateral submandibular tenderness but really no  significant adenopathy.  No mass.  No jugular venous distention.  Neck is  supple.  LUNGS:  Clear to auscultation.  No chest wall tenderness.  No CVA  tenderness.  HEART:  Regular rate and rhythm.  No murmur.  BREASTS:  Not examined.  ABDOMEN:  Bowel sounds are active.  She has diffuse tenderness, but really,  this is more noticeable in the right lower quadrant than anywhere else,  although she is tender in the left lower quadrant.  There is no inguinal  adenopathy or hernia.  No palpable mass.  Liver and spleen not enlarged.  EXTREMITIES:  She moves all four extremities well without pain or deformity.  NEUROLOGIC:  No gross motor or sensory  deficits.   ADMISSION DATA:  CT scan shows dilated appendix up to 8-9 mm, perhaps a  little bit of inflammatory change around it.  Really no other abnormalities  are noted on the CT scan by the radiologist.  The radiologist read this as  early appendicitis.   White blood cell count is 13,100.  Urine pregnancy test is negative.  Urinalysis is negative.   ASSESSMENT:  Probable acute appendicitis:  I cannot rule out mesenteric  adenitis or an ovarian process related to her pharyngitis.  Nevertheless,  given the abdominal tenderness on the right side and a CT scan finding of an  elevated white count, I think we are obligated to perform a laparoscopy to  rule out appendicitis.   PLAN:  I have discussed my tentative diagnosis with the patient and her  mother.  I have also discussed the differential diagnosis, including Crohn's  disease, diverticulitis, mesenteric adenitis, gynecologic problems, and  other unforeseen problems.  They understand this well.   I have discussed the indication in detail of appendectomy.  Risks and  complications have been outlined, including but not limited to bleeding,  infection, conversion to open laparotomy, more extensive surgery if another  diagnosis is apparent, wound problems such as infection or hernia, cardiac,  pulmonary, and thrombo-embolic problems.  She seems to understand these  issues well.  At this time, all of her questions are answered.  She is in  agreement and is willing to undergo the procedure.                                               Angelia Mould. Derrell Lolling, M.D.    HMI/MEDQ  D:  02/23/2004  T:  02/23/2004  Job:  [id number]   cc:   Lucky Cowboy, M.D.  [address]  [place], [place] [address]  Fax: [phone]   Joya Gaskins, MD

## 2011-01-17 NOTE — Assessment & Plan Note (Signed)
[name]                                 ON-CALL NOTE   [name], [name]                    MRN:          [id number]  DATE:06/11/2010                            DOB:          [birth year]    Lori Marshall called this morning stating that she is having mild lower  abdominal pain.  She has had pain for approximately 2 days.  It was  poorly described, although could be sharp at times.  She had a  colonoscopy where polyps removed approximately 1 week ago.  She has had  a limited rectal bleeding which she has had prior to the colonoscopy as  well.  She was given Librax to take for pain.  I encouraged her to take  the pain and carefully instructed her to call back in the next 2 hours  if she is not improved.  The office was notified of this exchange and  was also instructed to contact the patient.     Barbette Hair. Arlyce Dice, MD,FACG     RDK/MedQ  DD: 06/11/2010  DT: 06/11/2010  Job #: [id number]   cc:   Hedwig Morton. Juanda Chance, MD

## 2011-02-22 ENCOUNTER — Emergency Department (HOSPITAL_COMMUNITY)
Admission: EM | Admit: 2011-02-22 | Discharge: 2011-02-22 | Disposition: A | Discharge status: Home / Self Care | Payer: Medicaid Other | Attending: Emergency Medicine | Admitting: Emergency Medicine

## 2011-02-22 ENCOUNTER — Emergency Department (HOSPITAL_COMMUNITY): Payer: Medicaid Other

## 2011-02-22 DIAGNOSIS — S8010XA Contusion of unspecified lower leg, initial encounter: Secondary | ICD-10-CM | POA: Insufficient documentation

## 2011-02-22 DIAGNOSIS — F341 Dysthymic disorder: Secondary | ICD-10-CM | POA: Insufficient documentation

## 2011-02-22 DIAGNOSIS — Y92009 Unspecified place in unspecified non-institutional (private) residence as the place of occurrence of the external cause: Secondary | ICD-10-CM | POA: Insufficient documentation

## 2011-02-22 DIAGNOSIS — R51 Headache: Secondary | ICD-10-CM | POA: Insufficient documentation

## 2011-02-22 DIAGNOSIS — W108XXA Fall (on) (from) other stairs and steps, initial encounter: Secondary | ICD-10-CM | POA: Insufficient documentation

## 2011-02-22 DIAGNOSIS — F1010 Alcohol abuse, uncomplicated: Secondary | ICD-10-CM | POA: Insufficient documentation

## 2011-02-22 DIAGNOSIS — R269 Unspecified abnormalities of gait and mobility: Secondary | ICD-10-CM | POA: Insufficient documentation

## 2011-02-22 DIAGNOSIS — S0083XA Contusion of other part of head, initial encounter: Secondary | ICD-10-CM | POA: Insufficient documentation

## 2011-02-22 DIAGNOSIS — R748 Abnormal levels of other serum enzymes: Secondary | ICD-10-CM | POA: Insufficient documentation

## 2011-02-22 DIAGNOSIS — S0003XA Contusion of scalp, initial encounter: Secondary | ICD-10-CM | POA: Insufficient documentation

## 2011-02-22 LAB — CBC
HCT: 40.1 % (ref 36.0–46.0)
Hemoglobin: 13.6 g/dL (ref 12.0–15.0)
MCH: 32 pg (ref 26.0–34.0)
MCHC: 33.9 g/dL (ref 30.0–36.0)
MCV: 94.4 fL (ref 78.0–100.0)
Platelets: 285 10*3/uL (ref 150–400)
RBC: 4.25 MIL/uL (ref 3.87–5.11)
RDW: 12.5 % (ref 11.5–15.5)
WBC: 9 10*3/uL (ref 4.0–10.5)

## 2011-02-22 LAB — COMPREHENSIVE METABOLIC PANEL
ALT: 86 U/L — ABNORMAL HIGH (ref 0–35)
AST: 72 U/L — ABNORMAL HIGH (ref 0–37)
Albumin: 3.8 g/dL (ref 3.5–5.2)
Alkaline Phosphatase: 116 U/L (ref 39–117)
BUN: 11 mg/dL (ref 6–23)
CO2: 28 mEq/L (ref 19–32)
Calcium: 9 mg/dL (ref 8.4–10.5)
Chloride: 99 mEq/L (ref 96–112)
Creatinine, Ser: 0.56 mg/dL (ref 0.50–1.10)
GFR calc Af Amer: >60 mL/min (ref >60)
GFR calc non Af Amer: >60 mL/min (ref >60)
Glucose, Bld: 89 mg/dL (ref 70–99)
Potassium: 3.5 mEq/L (ref 3.5–5.1)
Sodium: 138 mEq/L (ref 135–145)
Total Bilirubin: 0.2 mg/dL — ABNORMAL LOW (ref 0.3–1.2)
Total Protein: 6.8 g/dL (ref 6.0–8.3)

## 2011-02-22 LAB — URINALYSIS, ROUTINE W REFLEX MICROSCOPIC
Bilirubin Urine: NEGATIVE
Glucose, UA: NEGATIVE mg/dL
Hgb urine dipstick: NEGATIVE
Ketones, ur: NEGATIVE mg/dL
Leukocytes, UA: NEGATIVE
Nitrite: NEGATIVE
Protein, ur: NEGATIVE mg/dL
Specific Gravity, Urine: 1.011 (ref 1.005–1.030)
Urobilinogen, UA: 0.2 mg/dL (ref 0.0–1.0)
pH: 7 (ref 5.0–8.0)

## 2011-02-22 LAB — RAPID URINE DRUG SCREEN, HOSP PERFORMED
Amphetamines: NOT DETECTED
Barbiturates: NOT DETECTED
Benzodiazepines: NOT DETECTED
Cocaine: NOT DETECTED
Opiates: NOT DETECTED
Tetrahydrocannabinol: NOT DETECTED

## 2011-02-22 LAB — ETHANOL: Alcohol, Ethyl (B): 231 mg/dL — ABNORMAL HIGH (ref 0–11)

## 2011-02-24 ENCOUNTER — Emergency Department (HOSPITAL_COMMUNITY)
Admission: EM | Admit: 2011-02-24 | Discharge: 2011-02-24 | Disposition: A | Discharge status: Home / Self Care | Payer: Medicaid Other | Attending: Emergency Medicine | Admitting: Emergency Medicine

## 2011-02-24 ENCOUNTER — Emergency Department (HOSPITAL_COMMUNITY): Payer: Medicaid Other

## 2011-02-24 DIAGNOSIS — R112 Nausea with vomiting, unspecified: Secondary | ICD-10-CM | POA: Insufficient documentation

## 2011-02-24 DIAGNOSIS — F1010 Alcohol abuse, uncomplicated: Secondary | ICD-10-CM | POA: Insufficient documentation

## 2011-02-24 DIAGNOSIS — R197 Diarrhea, unspecified: Secondary | ICD-10-CM | POA: Insufficient documentation

## 2011-02-24 LAB — ETHANOL: Alcohol, Ethyl (B): 100 mg/dL — ABNORMAL HIGH (ref 0–11)

## 2011-02-24 LAB — COMPREHENSIVE METABOLIC PANEL
ALT: 121 U/L — ABNORMAL HIGH (ref 0–35)
AST: 138 U/L — ABNORMAL HIGH (ref 0–37)
Albumin: 3.3 g/dL — ABNORMAL LOW (ref 3.5–5.2)
Alkaline Phosphatase: 93 U/L (ref 39–117)
BUN: 10 mg/dL (ref 6–23)
CO2: 29 mEq/L (ref 19–32)
Calcium: 8.8 mg/dL (ref 8.4–10.5)
Chloride: 99 mEq/L (ref 96–112)
Creatinine, Ser: <0.47 mg/dL — ABNORMAL LOW (ref 0.50–1.10)
Glucose, Bld: 133 mg/dL — ABNORMAL HIGH (ref 70–99)
Potassium: 3.8 mEq/L (ref 3.5–5.1)
Sodium: 137 mEq/L (ref 135–145)
Total Bilirubin: 0.4 mg/dL (ref 0.3–1.2)
Total Protein: 6.6 g/dL (ref 6.0–8.3)

## 2011-02-24 LAB — CBC
HCT: 39.5 % (ref 36.0–46.0)
Hemoglobin: 13.7 g/dL (ref 12.0–15.0)
MCH: 32.8 pg (ref 26.0–34.0)
MCHC: 34.7 g/dL (ref 30.0–36.0)
MCV: 94.5 fL (ref 78.0–100.0)
Platelets: 252 10*3/uL (ref 150–400)
RBC: 4.18 MIL/uL (ref 3.87–5.11)
RDW: 12.6 % (ref 11.5–15.5)
WBC: 7.3 10*3/uL (ref 4.0–10.5)

## 2011-02-24 LAB — DIFFERENTIAL
Basophils Absolute: 0 10*3/uL (ref 0.0–0.1)
Basophils Relative: 0 % (ref 0–1)
Eosinophils Absolute: 0.1 10*3/uL (ref 0.0–0.7)
Eosinophils Relative: 1 % (ref 0–5)
Lymphocytes Relative: 25 % (ref 12–46)
Lymphs Abs: 1.8 10*3/uL (ref 0.7–4.0)
Monocytes Absolute: 0.4 10*3/uL (ref 0.1–1.0)
Monocytes Relative: 6 % (ref 3–12)
Neutro Abs: 5 10*3/uL (ref 1.7–7.7)
Neutrophils Relative %: 69 % (ref 43–77)

## 2011-02-24 LAB — URINALYSIS, ROUTINE W REFLEX MICROSCOPIC
Bilirubin Urine: NEGATIVE
Glucose, UA: NEGATIVE mg/dL
Hgb urine dipstick: NEGATIVE
Ketones, ur: NEGATIVE mg/dL
Leukocytes, UA: NEGATIVE
Nitrite: NEGATIVE
Protein, ur: NEGATIVE mg/dL
Specific Gravity, Urine: 1.022 (ref 1.005–1.030)
Urobilinogen, UA: 0.2 mg/dL (ref 0.0–1.0)
pH: 7 (ref 5.0–8.0)

## 2011-02-24 LAB — POCT PREGNANCY, URINE: Preg Test, Ur: NEGATIVE

## 2011-02-24 LAB — LIPASE, BLOOD: Lipase: 15 U/L (ref 11–59)

## 2011-02-25 ENCOUNTER — Telehealth: Payer: Self-pay | Admitting: Internal Medicine

## 2011-02-26 NOTE — Telephone Encounter (Signed)
Pt scheduled to see Mike Gip PA 02/27/11@2 :30pm. Pts mother aware of appt date and time.

## 2011-02-26 NOTE — Telephone Encounter (Signed)
Spoke with Ms. Lori Marshall yesterday evening. After reviewing the records with Mike Gip PA we will see the pt either Thurs or Friday. Called back at 5:03pm and got the answering machine.   Called this am at 8:31am and left message for her to call back.

## 2011-02-27 ENCOUNTER — Encounter: Payer: Self-pay | Admitting: Physician Assistant

## 2011-02-27 ENCOUNTER — Ambulatory Visit (INDEPENDENT_AMBULATORY_CARE_PROVIDER_SITE_OTHER): Payer: Medicaid Other | Admitting: Physician Assistant

## 2011-02-27 ENCOUNTER — Other Ambulatory Visit (INDEPENDENT_AMBULATORY_CARE_PROVIDER_SITE_OTHER): Payer: Medicaid Other

## 2011-02-27 ENCOUNTER — Other Ambulatory Visit: Payer: Self-pay | Admitting: *Deleted

## 2011-02-27 ENCOUNTER — Telehealth: Payer: Self-pay | Admitting: Physician Assistant

## 2011-02-27 DIAGNOSIS — K920 Hematemesis: Secondary | ICD-10-CM

## 2011-02-27 DIAGNOSIS — R945 Abnormal results of liver function studies: Secondary | ICD-10-CM

## 2011-02-27 DIAGNOSIS — R112 Nausea with vomiting, unspecified: Secondary | ICD-10-CM

## 2011-02-27 LAB — CBC WITH DIFFERENTIAL/PLATELET
Basophils Absolute: 0 10*3/uL (ref 0.0–0.1)
Basophils Relative: 0.2 % (ref 0.0–3.0)
Eosinophils Absolute: 0.1 10*3/uL (ref 0.0–0.7)
Eosinophils Relative: 1.3 % (ref 0.0–5.0)
HCT: 35.9 % — ABNORMAL LOW (ref 36.0–46.0)
Hemoglobin: 12.4 g/dL (ref 12.0–15.0)
Lymphocytes Relative: 30.4 % (ref 12.0–46.0)
Lymphs Abs: 2.5 10*3/uL (ref 0.7–4.0)
MCHC: 34.6 g/dL (ref 30.0–36.0)
MCV: 97.7 fl (ref 78.0–100.0)
Monocytes Absolute: 0.5 10*3/uL (ref 0.1–1.0)
Monocytes Relative: 6.4 % (ref 3.0–12.0)
Neutro Abs: 5 10*3/uL (ref 1.4–7.7)
Neutrophils Relative %: 61.7 % (ref 43.0–77.0)
Platelets: 189 10*3/uL (ref 150.0–400.0)
RBC: 3.68 Mil/uL — ABNORMAL LOW (ref 3.87–5.11)
RDW: 13.2 % (ref 11.5–14.6)
WBC: 8.1 10*3/uL (ref 4.5–10.5)

## 2011-02-27 LAB — HEPATIC FUNCTION PANEL
ALT: 80 U/L — ABNORMAL HIGH (ref 0–35)
AST: 68 U/L — ABNORMAL HIGH (ref 0–37)
Albumin: 3.8 g/dL (ref 3.5–5.2)
Alkaline Phosphatase: 87 U/L (ref 39–117)
Bilirubin, Direct: 0.1 mg/dL (ref 0.0–0.3)
Total Bilirubin: 0.7 mg/dL (ref 0.3–1.2)
Total Protein: 6.6 g/dL (ref 6.0–8.3)

## 2011-02-27 MED ORDER — ESOMEPRAZOLE MAGNESIUM 40 MG PO CPDR: DELAYED_RELEASE_CAPSULE | ORAL | Status: DC

## 2011-02-27 MED ORDER — PROMETHAZINE HCL 25 MG PO TABS: 25.0000 mg | ORAL_TABLET | Freq: Four times a day (QID) | ORAL | Status: DC | PRN

## 2011-02-27 NOTE — Progress Notes (Signed)
Subjective:    Patient ID: [name], female    DOB: February 27, 1976, 35 y.o.   MRN: [id number]  HPI Corianna is a 35 year old white female known to Dr. Lina Sar  who has been seen in the past for abdominal pain and diarrhea as well as GERD. She did undergo a colonoscopy on Mr. 2011 had random biopsies to rule out microscopic colitis which were negative. She is felt to have IBS. Patient comes in today with complaint of intermittent vomiting over the past 3-4 months and a two-week history of almost daily vomiting. She complains of epigastric pain and chest and throat discomfort secondary to vomiting. She relates that she's waking up frequently early in the morning between 3 and 5 AM nauseated and then vomits. Apparently she has been having hard retching. Over the past 5-6 days she has seen intermittent dark blood mixed in with her emesis and some streaky red blood. She does complain of some dye and aphasia but has been drinking liquids and eating soup. She denies any regular use of aspirin or NSAIDs though takes occasional Aleve. She went to the emergency room on 6/23 because she apparently fell down the stairs at home. She was accompanied by her mother and it was felt that she tripped due to wearing flip-flop. She did not lose consciousness and did mention that she had been having nausea and vomiting intermittently throughout the week. Patient did mention that she had been drinking alcohol at night despite having problems with nausea and vomiting and in fact her alcohol level was 231.  WBC of 9 hemoglobin 13.6 hematocrit of 40.1 MCV of 94 platelets 285 electrolytes were within normal limits SGOT was elevated at 72 SGPT 86. CT of the head was done and was negative and she had CT of the cervical spine also negative.  She went back to the emergency room on 625 after vomiting up a small amount of blood. She had plain abdominal films done which were negative, and a repeat CBC was normal. She was given  Phenergan and asked to followup with GI. Her alcohol level at that point I believe was above 100. I also have labs which were done in April 2012 showing an alcohol level of 203 CT of the abdomen and pelvis was done that same evaluation and was unremarkable gallbladder liver spleen and pancreas appeared normal.  On further questioning today, in the presence of the patient's mother she denies having alcohol problems and says she may drink a glass of wine with dinner but is not drinking on a daily basis. She says she has not had any alcohol this week    Review of Systems  Constitutional: Positive for appetite change.  HENT: Negative.   Eyes: Negative.   Respiratory: Negative.   Cardiovascular: Negative.   Gastrointestinal: Positive for nausea, vomiting and abdominal pain.  Genitourinary: Negative.   Musculoskeletal: Negative.   Skin: Negative.   Neurological: Positive for weakness.  Hematological: Negative.   Psychiatric/Behavioral: The patient is nervous/anxious.     Outpatient Encounter Prescriptions as of 02/27/2011  Medication Sig Dispense Refill  . acyclovir (ZOVIRAX) 800 MG tablet Take 800 mg by mouth as needed.        . clonazePAM (KLONOPIN) 2 MG tablet Take 2 mg by mouth as directed.        [name] omeprazole (PRILOSEC) 40 MG capsule Take 20 mg by mouth daily.       . sertraline (ZOLOFT) 100 MG tablet Take 50 mg by mouth  daily.       . esomeprazole (NEXIUM) 40 MG capsule Take 1 cap twice daily. Take 1 30 min before breakfast and dinner.  Lot # [id number] Exp date: 11/2013  30 capsule  0  . DISCONTD: butalbital-acetaminophen-caffeine (ESGIC) 50-325-40 MG per tablet Take 1 tablet by mouth 2 (two) times daily as needed.        [name] DISCONTD: clidinium-chlordiazePOXIDE (LIBRAX) 2.5-5 MG per capsule Take 1 capsule by mouth 4 (four) times daily -  before meals and at bedtime.        [name] DISCONTD: Hyoscyamine Sulfate (HYOMAX-DT) 0.375 MG TBCR Take 1 tablet by mouth 2 (two) times daily.        [name]  DISCONTD: prochlorperazine (COMPAZINE) 10 MG tablet Take 10 mg by mouth every 8 (eight) hours as needed.              Objective:   Physical Exam Well-developed young white female, in no acute distress, lying on the exam table HEENT; nontraumatic normocephalic EOMI PERRLA sclera anicteric  Neck ;supple no JVD  Cardiovascular; regular rate and rhythm with S1-S2 no murmur rub or gallop  Pulmonary; clear bilaterally  Abdomen; soft tender in the epigastrium and right upper quadrant no guarding no rebound no palpable mass or hepatosplenomegaly no appreciable fluid wave  Rectal; scant stool trace heme positive  Extremities; benign no edema  Neuro; alert and oriented x3 nonfoca l Psych; mood and affect appropriate.      Assessment & Plan:  #60 35 year old female with one-week history of persistent nausea and vomiting with small-volume hematemesis, and to date no evidence for active GI bleeding. Suspect acute alcohol-induced gastropathy and probable secondary esophagitis. Cannot rule out peptic ulcer disease.  #2 Abnormal liver function studies, suspect mild alcohol-induced Patient denies ongoing alcohol abuse, but has had positive alcohol levels on 3 occasions.  Plan; repeat hepatic panel, CBC, EtOH, and CDT level Today Start Nexium 40 mg by mouth twice daily, samples were given Advised Abstinence from all alcohol Schedule for upper endoscopy with Dr. Leone Payor tomorrow. Procedure was discussed in detail with the patient and her mother. Phenergan 25 mg by mouth every 6 hours when necessary nausea and vomiting.

## 2011-02-27 NOTE — Patient Instructions (Signed)
Please go to the basement level to have your labs drawn.  The Endoscopy is scheduled with Dr Leone Payor at [facility]. Directions and brochure provided. We have given you samples of Nexium 40, take twice daily, 30 min before breakfast and dinner.

## 2011-02-28 ENCOUNTER — Ambulatory Visit (HOSPITAL_COMMUNITY)
Admission: RE | Admit: 2011-02-28 | Discharge: 2011-02-28 | Disposition: A | Discharge status: Home / Self Care | Payer: Medicaid Other | Source: Ambulatory Visit | Attending: Internal Medicine | Admitting: Internal Medicine

## 2011-02-28 ENCOUNTER — Encounter: Payer: Medicaid Other | Admitting: Internal Medicine

## 2011-02-28 DIAGNOSIS — K920 Hematemesis: Secondary | ICD-10-CM

## 2011-02-28 DIAGNOSIS — K921 Melena: Secondary | ICD-10-CM | POA: Insufficient documentation

## 2011-02-28 DIAGNOSIS — K2970 Gastritis, unspecified, without bleeding: Secondary | ICD-10-CM

## 2011-02-28 DIAGNOSIS — K2990 Gastroduodenitis, unspecified, without bleeding: Secondary | ICD-10-CM | POA: Insufficient documentation

## 2011-02-28 LAB — ETHANOL: Alcohol, Ethyl (B): <10 mg/dL (ref 0–10)

## 2011-02-28 MED ORDER — GI COCKTAIL ~~LOC~~: 30.0000 mL | Freq: Four times a day (QID) | ORAL | Status: DC | PRN

## 2011-02-28 NOTE — Progress Notes (Signed)
Agree with Ms. Esterwood's assessment and plan.  

## 2011-03-03 ENCOUNTER — Telehealth: Payer: Self-pay | Admitting: Internal Medicine

## 2011-03-03 NOTE — Telephone Encounter (Signed)
[facility] and Dr. Leone Payor spoke with him concerning this. GI Cocktail.

## 2011-03-06 LAB — CARBOHYDRATE DEFICIENT TRANSFERRIN: CDT: 1.8 % — ABNORMAL HIGH (ref <=1.6)

## 2011-05-06 ENCOUNTER — Emergency Department (HOSPITAL_COMMUNITY): Payer: Medicaid Other

## 2011-05-06 ENCOUNTER — Emergency Department (HOSPITAL_COMMUNITY)
Admission: EM | Admit: 2011-05-06 | Discharge: 2011-05-06 | Disposition: A | Discharge status: Home / Self Care | Payer: Medicaid Other | Attending: Emergency Medicine | Admitting: Emergency Medicine

## 2011-05-06 DIAGNOSIS — E039 Hypothyroidism, unspecified: Secondary | ICD-10-CM | POA: Insufficient documentation

## 2011-05-06 DIAGNOSIS — W108XXA Fall (on) (from) other stairs and steps, initial encounter: Secondary | ICD-10-CM | POA: Insufficient documentation

## 2011-05-06 DIAGNOSIS — F411 Generalized anxiety disorder: Secondary | ICD-10-CM | POA: Insufficient documentation

## 2011-05-06 DIAGNOSIS — M549 Dorsalgia, unspecified: Secondary | ICD-10-CM | POA: Insufficient documentation

## 2011-05-06 DIAGNOSIS — Y92009 Unspecified place in unspecified non-institutional (private) residence as the place of occurrence of the external cause: Secondary | ICD-10-CM | POA: Insufficient documentation

## 2011-05-06 DIAGNOSIS — S20229A Contusion of unspecified back wall of thorax, initial encounter: Secondary | ICD-10-CM | POA: Insufficient documentation

## 2011-05-06 DIAGNOSIS — M542 Cervicalgia: Secondary | ICD-10-CM | POA: Insufficient documentation

## 2011-05-06 LAB — POCT PREGNANCY, URINE: Preg Test, Ur: NEGATIVE

## 2011-05-11 ENCOUNTER — Emergency Department (HOSPITAL_COMMUNITY)
Admission: EM | Admit: 2011-05-11 | Discharge: 2011-05-11 | Disposition: A | Discharge status: Home / Self Care | Payer: Medicaid Other | Attending: Emergency Medicine | Admitting: Emergency Medicine

## 2011-05-11 DIAGNOSIS — E039 Hypothyroidism, unspecified: Secondary | ICD-10-CM | POA: Insufficient documentation

## 2011-05-11 DIAGNOSIS — F1010 Alcohol abuse, uncomplicated: Secondary | ICD-10-CM | POA: Insufficient documentation

## 2011-05-11 DIAGNOSIS — R109 Unspecified abdominal pain: Secondary | ICD-10-CM | POA: Insufficient documentation

## 2011-05-11 LAB — URINALYSIS, ROUTINE W REFLEX MICROSCOPIC
Bilirubin Urine: NEGATIVE
Glucose, UA: NEGATIVE mg/dL
Hgb urine dipstick: NEGATIVE
Ketones, ur: NEGATIVE mg/dL
Leukocytes, UA: NEGATIVE
Nitrite: NEGATIVE
Protein, ur: NEGATIVE mg/dL
Specific Gravity, Urine: 1.017 (ref 1.005–1.030)
Urobilinogen, UA: 0.2 mg/dL (ref 0.0–1.0)
pH: 7 (ref 5.0–8.0)

## 2011-05-11 LAB — DIFFERENTIAL
Basophils Absolute: 0 10*3/uL (ref 0.0–0.1)
Basophils Relative: 0 % (ref 0–1)
Eosinophils Absolute: 0.2 10*3/uL (ref 0.0–0.7)
Eosinophils Relative: 2 % (ref 0–5)
Lymphocytes Relative: 41 % (ref 12–46)
Lymphs Abs: 3.2 10*3/uL (ref 0.7–4.0)
Monocytes Absolute: 0.6 10*3/uL (ref 0.1–1.0)
Monocytes Relative: 7 % (ref 3–12)
Neutro Abs: 3.8 10*3/uL (ref 1.7–7.7)
Neutrophils Relative %: 49 % (ref 43–77)

## 2011-05-11 LAB — CBC
HCT: 38.4 % (ref 36.0–46.0)
Hemoglobin: 13 g/dL (ref 12.0–15.0)
MCH: 31.8 pg (ref 26.0–34.0)
MCHC: 33.9 g/dL (ref 30.0–36.0)
MCV: 93.9 fL (ref 78.0–100.0)
Platelets: 300 10*3/uL (ref 150–400)
RBC: 4.09 MIL/uL (ref 3.87–5.11)
RDW: 12.3 % (ref 11.5–15.5)
WBC: 7.7 10*3/uL (ref 4.0–10.5)

## 2011-05-11 LAB — COMPREHENSIVE METABOLIC PANEL
ALT: 40 U/L — ABNORMAL HIGH (ref 0–35)
AST: 53 U/L — ABNORMAL HIGH (ref 0–37)
Albumin: 3.9 g/dL (ref 3.5–5.2)
Alkaline Phosphatase: 100 U/L (ref 39–117)
BUN: 9 mg/dL (ref 6–23)
CO2: 26 mEq/L (ref 19–32)
Calcium: 9 mg/dL (ref 8.4–10.5)
Chloride: 100 mEq/L (ref 96–112)
Creatinine, Ser: <0.47 mg/dL — ABNORMAL LOW (ref 0.50–1.10)
Glucose, Bld: 107 mg/dL — ABNORMAL HIGH (ref 70–99)
Potassium: 4.8 mEq/L (ref 3.5–5.1)
Sodium: 138 mEq/L (ref 135–145)
Total Bilirubin: 0.1 mg/dL — ABNORMAL LOW (ref 0.3–1.2)
Total Protein: 7.2 g/dL (ref 6.0–8.3)

## 2011-05-11 LAB — ETHANOL: Alcohol, Ethyl (B): 192 mg/dL — ABNORMAL HIGH (ref 0–11)

## 2011-05-11 LAB — POCT PREGNANCY, URINE: Preg Test, Ur: NEGATIVE

## 2011-05-11 LAB — GLUCOSE, CAPILLARY: Glucose-Capillary: 114 mg/dL — ABNORMAL HIGH (ref 70–99)

## 2011-05-11 LAB — LIPASE, BLOOD: Lipase: 33 U/L (ref 11–59)

## 2011-05-15 ENCOUNTER — Emergency Department (HOSPITAL_COMMUNITY)
Admission: EM | Admit: 2011-05-15 | Discharge: 2011-05-15 | Disposition: A | Discharge status: Other Acute Inpatient Hospital | Payer: Medicaid Other | Attending: Emergency Medicine | Admitting: Emergency Medicine

## 2011-05-15 ENCOUNTER — Inpatient Hospital Stay (HOSPITAL_COMMUNITY)
Admission: AD | Admit: 2011-05-15 | Discharge: 2011-05-21 | DRG: 885 | Disposition: A | Discharge status: Home / Self Care | Payer: PRIVATE HEALTH INSURANCE | Attending: Psychiatry | Admitting: Psychiatry

## 2011-05-15 DIAGNOSIS — Z23 Encounter for immunization: Secondary | ICD-10-CM | POA: Insufficient documentation

## 2011-05-15 DIAGNOSIS — Z6379 Other stressful life events affecting family and household: Secondary | ICD-10-CM

## 2011-05-15 DIAGNOSIS — F329 Major depressive disorder, single episode, unspecified: Secondary | ICD-10-CM | POA: Insufficient documentation

## 2011-05-15 DIAGNOSIS — F411 Generalized anxiety disorder: Secondary | ICD-10-CM

## 2011-05-15 DIAGNOSIS — F3289 Other specified depressive episodes: Secondary | ICD-10-CM | POA: Insufficient documentation

## 2011-05-15 DIAGNOSIS — F1320 Sedative, hypnotic or anxiolytic dependence, uncomplicated: Secondary | ICD-10-CM

## 2011-05-15 DIAGNOSIS — Z79899 Other long term (current) drug therapy: Secondary | ICD-10-CM

## 2011-05-15 DIAGNOSIS — R109 Unspecified abdominal pain: Secondary | ICD-10-CM

## 2011-05-15 DIAGNOSIS — IMO0002 Reserved for concepts with insufficient information to code with codable children: Secondary | ICD-10-CM | POA: Insufficient documentation

## 2011-05-15 DIAGNOSIS — F1010 Alcohol abuse, uncomplicated: Secondary | ICD-10-CM

## 2011-05-15 DIAGNOSIS — X838XXA Intentional self-harm by other specified means, initial encounter: Secondary | ICD-10-CM

## 2011-05-15 DIAGNOSIS — M1710 Unilateral primary osteoarthritis, unspecified knee: Secondary | ICD-10-CM

## 2011-05-15 DIAGNOSIS — K2990 Gastroduodenitis, unspecified, without bleeding: Secondary | ICD-10-CM

## 2011-05-15 DIAGNOSIS — E039 Hypothyroidism, unspecified: Secondary | ICD-10-CM

## 2011-05-15 DIAGNOSIS — K2970 Gastritis, unspecified, without bleeding: Secondary | ICD-10-CM

## 2011-05-15 DIAGNOSIS — S61509A Unspecified open wound of unspecified wrist, initial encounter: Secondary | ICD-10-CM

## 2011-05-15 DIAGNOSIS — Z818 Family history of other mental and behavioral disorders: Secondary | ICD-10-CM

## 2011-05-15 DIAGNOSIS — X789XXA Intentional self-harm by unspecified sharp object, initial encounter: Secondary | ICD-10-CM | POA: Insufficient documentation

## 2011-05-15 LAB — RAPID URINE DRUG SCREEN, HOSP PERFORMED
Amphetamines: NOT DETECTED
Barbiturates: NOT DETECTED
Benzodiazepines: POSITIVE — AB
Cocaine: NOT DETECTED
Opiates: NOT DETECTED
Tetrahydrocannabinol: NOT DETECTED

## 2011-05-15 LAB — CBC
HCT: 38.7 % (ref 36.0–46.0)
Hemoglobin: 12.8 g/dL (ref 12.0–15.0)
MCH: 31.3 pg (ref 26.0–34.0)
MCHC: 33.1 g/dL (ref 30.0–36.0)
MCV: 94.6 fL (ref 78.0–100.0)
Platelets: 290 10*3/uL (ref 150–400)
RBC: 4.09 MIL/uL (ref 3.87–5.11)
RDW: 12.2 % (ref 11.5–15.5)
WBC: 8 10*3/uL (ref 4.0–10.5)

## 2011-05-15 LAB — BASIC METABOLIC PANEL
BUN: 10 mg/dL (ref 6–23)
CO2: 28 mEq/L (ref 19–32)
Calcium: 9 mg/dL (ref 8.4–10.5)
Chloride: 100 mEq/L (ref 96–112)
Creatinine, Ser: 0.49 mg/dL — ABNORMAL LOW (ref 0.50–1.10)
GFR calc Af Amer: >60 mL/min (ref >60)
GFR calc non Af Amer: >60 mL/min (ref >60)
Glucose, Bld: 100 mg/dL — ABNORMAL HIGH (ref 70–99)
Potassium: 3.5 mEq/L (ref 3.5–5.1)
Sodium: 137 mEq/L (ref 135–145)

## 2011-05-15 LAB — DIFFERENTIAL
Basophils Absolute: 0 10*3/uL (ref 0.0–0.1)
Basophils Relative: 0 % (ref 0–1)
Eosinophils Absolute: 0.2 10*3/uL (ref 0.0–0.7)
Eosinophils Relative: 3 % (ref 0–5)
Lymphocytes Relative: 46 % (ref 12–46)
Lymphs Abs: 3.7 10*3/uL (ref 0.7–4.0)
Monocytes Absolute: 0.8 10*3/uL (ref 0.1–1.0)
Monocytes Relative: 10 % (ref 3–12)
Neutro Abs: 3.4 10*3/uL (ref 1.7–7.7)
Neutrophils Relative %: 42 % — ABNORMAL LOW (ref 43–77)

## 2011-05-15 LAB — ETHANOL: Alcohol, Ethyl (B): 52 mg/dL — ABNORMAL HIGH (ref 0–11)

## 2011-05-15 LAB — POCT PREGNANCY, URINE: Preg Test, Ur: NEGATIVE

## 2011-05-16 ENCOUNTER — Ambulatory Visit (HOSPITAL_COMMUNITY)
Admission: EM | Admit: 2011-05-16 | Discharge: 2011-05-16 | Disposition: A | Discharge status: Other Acute Inpatient Hospital | Payer: Medicaid Other | Source: Other Acute Inpatient Hospital | Attending: Emergency Medicine | Admitting: Emergency Medicine

## 2011-05-16 DIAGNOSIS — F39 Unspecified mood [affective] disorder: Secondary | ICD-10-CM

## 2011-05-16 DIAGNOSIS — E039 Hypothyroidism, unspecified: Secondary | ICD-10-CM | POA: Insufficient documentation

## 2011-05-16 DIAGNOSIS — R109 Unspecified abdominal pain: Secondary | ICD-10-CM | POA: Insufficient documentation

## 2011-05-16 DIAGNOSIS — R110 Nausea: Secondary | ICD-10-CM | POA: Insufficient documentation

## 2011-05-16 DIAGNOSIS — F17200 Nicotine dependence, unspecified, uncomplicated: Secondary | ICD-10-CM | POA: Insufficient documentation

## 2011-05-16 DIAGNOSIS — K589 Irritable bowel syndrome without diarrhea: Secondary | ICD-10-CM | POA: Insufficient documentation

## 2011-05-16 LAB — COMPREHENSIVE METABOLIC PANEL
ALT: 25 U/L (ref 0–35)
AST: 28 U/L (ref 0–37)
Albumin: 3.5 g/dL (ref 3.5–5.2)
Alkaline Phosphatase: 76 U/L (ref 39–117)
BUN: 10 mg/dL (ref 6–23)
CO2: 26 mEq/L (ref 19–32)
Calcium: 9.4 mg/dL (ref 8.4–10.5)
Chloride: 101 mEq/L (ref 96–112)
Creatinine, Ser: 0.67 mg/dL (ref 0.50–1.10)
GFR calc Af Amer: >60 mL/min (ref >60)
GFR calc non Af Amer: >60 mL/min (ref >60)
Glucose, Bld: 114 mg/dL — ABNORMAL HIGH (ref 70–99)
Potassium: 3.2 mEq/L — ABNORMAL LOW (ref 3.5–5.1)
Sodium: 138 mEq/L (ref 135–145)
Total Bilirubin: 0.3 mg/dL (ref 0.3–1.2)
Total Protein: 6.4 g/dL (ref 6.0–8.3)

## 2011-05-16 LAB — CBC
HCT: 36.5 % (ref 36.0–46.0)
Hemoglobin: 12.1 g/dL (ref 12.0–15.0)
MCH: 31.1 pg (ref 26.0–34.0)
MCHC: 33.2 g/dL (ref 30.0–36.0)
MCV: 93.8 fL (ref 78.0–100.0)
Platelets: 254 10*3/uL (ref 150–400)
RBC: 3.89 MIL/uL (ref 3.87–5.11)
RDW: 12.4 % (ref 11.5–15.5)
WBC: 8.6 10*3/uL (ref 4.0–10.5)

## 2011-05-21 NOTE — Assessment & Plan Note (Signed)
NAMEERIANA, SULIMAN             ACCOUNT NO.:  [id number]  MEDICAL RECORD NO.:  [id number]  LOCATION:  0305                          FACILITY:  [facility]  PHYSICIAN:  Orson Aloe, MD       DATE OF BIRTH:  1976-04-23  DATE OF ADMISSION:  05/15/2011 DATE OF DISCHARGE:                      PSYCHIATRIC ADMISSION ASSESSMENT   TIME OF ASSESSMENT:  1330 hours.  IDENTIFYING INFORMATION:  A 35 year old Caucasian female.  This is an involuntary admission.  HISTORY OF PRESENT ILLNESS:  This is the first inpatient psychiatric admission for Emberlynn, a 35 year old who made some superficial scratches on both wrists while she was under the influence of alcohol. She then called one of her sorority sisters and told her she felt that life was not worth living anymore and that she had nothing to live for. The sorority sister called the ambulance and the sheriff came to the home.  She was placed under involuntary petition in the emergency room. She says that she feels like a failure.  At the age of 60, she wishes that she were married and had children and has had poor luck in relationships.  Recently has had multiple losses.  Her father is abusing alcohol in the home daily after surviving cancer that was diagnosed 6 years ago.  A friend of hers died 6 weeks ago, possibly due to complications from alcoholism.  She continues to be financially dependent on her parents.  This past year, she has been diagnosed with colitis.  She reports that she drinks alcohol socially but is vague about her use pattern.  Alcohol level was 58 in the emergency room.  She denies other substance abuse.  Today she endorses passive suicidality.  PAST PSYCHIATRIC HISTORY:  She is currently receiving counseling services from [place], the counselor at [facility].  She is treated for depression by her primary care physician, who prescribed Zoloft more than a year ago, and she takes 100 mg daily.  He instructed her to  increase that to 200 mg several months ago, but she has continued at the current dose.  She has a history of panic disorder, anxiety and depression.  No history of previous psychiatric hospitalizations.  She has a basic education.  No history of brain injury, blackout or trauma.  SOCIAL HISTORY:  A 35 year old female, married and divorced 10 years ago in 2002.  Recently had a failed relationship a year ago when her boyfriend left her during the time she was being diagnosed with colitis. 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  Makes 10 dollars an hour, which is not enough to live independently.  Recently had a friend with alcoholism who died, and a memorial service was held 2 weeks ago, which compounded her depression.  She has a history of 1 DWI 1 month ago.  FAMILY HISTORY:  Father with depression and alcohol abuse after being diagnosed with cancer 6 years ago.  MEDICAL HISTORY:  Primary care physician is Dr. Lucky Cowboy.  Dr. Lina Sar, gastroenterologist.  Medical problems are colitis, osteoarthritis of the right knee.  PAST MEDICAL HISTORY:  Positive for appendectomy, previous colostomy, which was reversed several months ago.  CURRENT MEDICATIONS: 1. Omeprazole 40  mg daily. 2. Zoloft 100 mg q.a.m. 3. Levothyroxine 50 mcg daily. 4. Reglan 10 mg q.a.m. and q.h.s. 5. Hyoscyamine 0.125 mg 1 to 2 tablets q.4 h p.r.n. intestinal spasm. 6. Clonazepam 2 mg.  She typically takes 1 tablet between 2:00 and     3:00 in the afternoon and 2 mg at h.s.  It is prescribed 1/2 to 1     tablet t.i.d. p.r.n. for anxiety.  DRUG ALLERGIES:  None.  Physical examination was done in the emergency room and is noted in the record.  This is a normally-developed Caucasian female who is ambulating on crutches due to a recent right knee injury.  Pleasant, cooperative, complaining of some abdominal pain.  She vomited.  REVIEW OF SYSTEMS:  Positive for vomiting 3 times  yesterday, none today. Is currently keeping food and fluids down.  Made superficial scratches on both wrists, which did not require any suturing.  She is asking for help with her abdominal pain.  Diagnostic studies were performed in the emergency room.  Urine drug screen positive for benzodiazepines.  Metabolic panel is normal.  BUN 10, creatinine 0.49.  Alcohol level 52.  CBC normal with a hemoglobin of 12.8, and urine pregnancy test negative.  Her vital signs today are temperature 98.2, pulse 77, respirations 18, blood pressure 118/84.  She weighs 66 kg and is 5 feet 4 inches tall.  MENTAL STATUS EXAM:  Fully alert female.  Cooperative, quite anxious initially.  Relaxes quite a bit, and abdominal pain eases up with discussion.  She declines my offer to send her to the emergency room. Has been keeping food and fluids down.  Feels if she can just get a dose of hyoscyamine that that may alleviate her abdominal pain.  Thinking is logical.  Gives a coherent history of several losses and is feeling like there is nothing positive in her life since her friend with alcoholism died about 6 weeks ago.  Became more depressed this week when she went to the [employer].  No evidence of psychosis.  No delusional statements.  Mood is depressed.  Speech is normal.  Denying alcohol abuse.  AXIS I:.  Major depressive disorder.  Rule out alcohol abuse. Benzodiazepine dependence. AXIS II:  Deferred. AXIS III:  Superficial cuts, bilateral wrists, healing.  Abdominal pain. Colitis.  Osteoarthritis, right knee. AXIS IV:  Stage of life issues, economic pressures and relationship issues. AXIS V:  Current is 40, past year not known.  The plan is to voluntarily admit her.  She has agreed to sign involuntarily, so we will allow her to do that.  We are going to check a STAT CBC and CMET.  We will also check a UA and a TSH.  We will continue her routine medications with the exception of the Klonopin  and for now we will start her on a Librium protocol and get some more information before we make a decision about restarting her Klonopin.  We will give her a dose of hyoscyamine now to alleviate her intestinal discomfort.     Margaret A. Lorin Picket, N.P.   ______________________________ Orson Aloe, MD    MAS/MEDQ  D:  05/16/2011  T:  05/16/2011  Job:  [id number]  Electronically Signed by Kari Baars N.P. on 05/20/2011 11:49:43 AM Electronically Signed by Orson Aloe  on 05/21/2011 01:12:36 PM

## 2011-05-26 NOTE — Discharge Summary (Signed)
NAMELILLIA, Lori Marshall NO.:  [id number]  MEDICAL RECORD NO.:  [id number]  LOCATION:  0305                          FACILITY:  [facility]  PHYSICIAN:  Orson Aloe, MD       DATE OF BIRTH:  01-31-1976  DATE OF ADMISSION:  05/15/2011 DATE OF DISCHARGE:  05/21/2011                              DISCHARGE SUMMARY   HISTORY OF PRESENT ILLNESS:  First [facility] admission for [facility], who made superficial scratches on both wrists while she was under the influence of alcohol.  She then called one of her sorority sisters and told her what she had done and revealed that she felt that life was not worth living anymore, that she had nothing to live for.  Her sorority sister called an ambulance and sheriff, who arrived at her home and took her to the emergency room.  She was placed under involuntary petition in the emergency room.  She says she feels like a failure in her life because at the age of 46 she is still financially dependent on her parents and wishes that she had a family of her own.  She revealed recent multiple losses, most significantly a breakup with her boyfriend of 3 years, in December 2011.  She reports that she drinks alcohol socially but was noted to have recent regular visits to the emergency room, at least twice monthly, intoxicated, with alcohol levels up in the high 200s.  Alcohol level in the emergency room was 58.  She denied any other substance abuse.  MEDICAL EVALUATION:  Medically evaluated in the emergency room.  Her primary care physician is Dr. Lucky Cowboy.  Also followed by Dr. Lina Sar, her gastroenterologist.  Medical problems are colitis and osteoarthritis of the right knee.  Past medical history negative for seizures.  She denies any blackouts on alcohol.  This is a well- nourished, well-developed female, whose full physical exam was noted in the emergency room.  CBC noted to be normal with a hemoglobin of 12.8. Chemistry normal.  BUN 10,  creatinine 0.49.  Urine drug screen positive for benzodiazepines and metabolic panel normal.  Her urine pregnancy test was negative.  She is 5 feet 4 inches tall, temperature 98.2, pulse 77, respirations 18, blood pressure 118/84.  COURSE OF HOSPITALIZATION:  She was admitted to our dual diagnosis unit, started on a Librium protocol with a goal of a safe detox in 4 days. She presented fully alert and cooperative, complaining of abdominal pain.  Was keeping food and fluids down and was given her routine medications of hyoscyamine without relief.  She was evaluated in the emergency room with no acute findings.  We added Carafate 1 g q.i.d. to her medications, and her abdominal pain gradually resolved without further incident.  We continued her levothyroxine for hypothyroidism. By her second day, she was up and ambulatory, taking food and fluids, and participating well in group.  She was able to reveal that she had begun using alcohol heavily after the breakup with her boyfriend of 3 years in December 2011.  The heavy use of alcohol, which was usually followed by some abdominal pain and a trip to the emergency room, gave her some attention that she felt  she was not getting since the breakup.  She recognized that this was not productive behavior and began to formulate plans for how to structure her time and avoid alcohol.  She voiced her intention to remain abstinent from alcohol.  From her first day on, she clearly and convincingly denied any intention to actually kill herself.  Felt that her thoughts were somehow made worse by her intoxication.  She complained of a history of panic symptoms and headaches on and off and was started on Tegretol for mood stability.  She tolerated the medication well and was ready for discharge by the 19th.  DISCHARGE DIAGNOSES:  Axis I:  Major depressive disorder.  Alcohol abuse.  Rule out benzodiazepine dependence. Axis II:  Deferred. Axis III:   Superficial cuts, both wrists, self-inflicted.  Abdominal pain, rule out gastritis, resolved colitis by history.  Osteoarthritis, right knee. Axis IV:  Severe, stage-of-life issues and economic pressures. Axis V:  Current 55, past year not known.  DISCHARGE CONDITION:  Stable and improved.  FOLLOWUP PLANS:  Follow up with [facility] walk-in clinic at 8:00 a.m. on September 20.  She will also resume counseling with Baxter Hire at [facility] in [place] on September 24 at 5:00 p.m.  DISCHARGE MEDICATIONS: 1. Carbamazepine 200 mg 1 tablet q.a.m. and q.h.s. 2. Hyoscyamine 0.125 mg q.4 h as needed for intestinal spasms. 3. Metoclopramide 10 mg by mouth daily. 4. Zoloft 100 mg 2 tablets daily. 5. Levothyroxine 50 mcg daily. 6. Omeprazole 40 mg daily. 7. Vistaril 50 mg h.s. p.r.n. insomnia and may use up to twice daily     for anxiety.  She was instructed to discontinue her h.s. dose of Reglan due to possible nightmares and to discontinue the Klonopin 2 mg tablet that she was previously taking.     Margaret A. Lorin Picket, N.P.   ______________________________ Orson Aloe, MD    MAS/MEDQ  D:  05/21/2011  T:  05/21/2011  Job:  [id number]  Electronically Signed by Kari Baars N.P. on 05/22/2011 08:54:12 AM Electronically Signed by Orson Aloe  on 05/26/2011 08:29:59 AM

## 2011-07-08 NOTE — Telephone Encounter (Signed)
done

## 2011-07-12 ENCOUNTER — Emergency Department (HOSPITAL_COMMUNITY)
Admission: EM | Admit: 2011-07-12 | Discharge: 2011-07-12 | Disposition: A | Discharge status: Home / Self Care | Payer: Medicaid Other | Attending: Emergency Medicine | Admitting: Emergency Medicine

## 2011-07-12 ENCOUNTER — Inpatient Hospital Stay (HOSPITAL_COMMUNITY): Admission: RE | Admit: 2011-07-12 | Payer: Self-pay | Source: Ambulatory Visit | Admitting: Psychiatry

## 2011-07-12 ENCOUNTER — Encounter: Payer: Self-pay | Admitting: Emergency Medicine

## 2011-07-12 DIAGNOSIS — F1010 Alcohol abuse, uncomplicated: Secondary | ICD-10-CM | POA: Insufficient documentation

## 2011-07-12 DIAGNOSIS — R45851 Suicidal ideations: Secondary | ICD-10-CM | POA: Insufficient documentation

## 2011-07-12 DIAGNOSIS — R064 Hyperventilation: Secondary | ICD-10-CM | POA: Insufficient documentation

## 2011-07-12 DIAGNOSIS — X789XXA Intentional self-harm by unspecified sharp object, initial encounter: Secondary | ICD-10-CM | POA: Insufficient documentation

## 2011-07-12 DIAGNOSIS — F3289 Other specified depressive episodes: Secondary | ICD-10-CM | POA: Insufficient documentation

## 2011-07-12 DIAGNOSIS — F32A Depression, unspecified: Secondary | ICD-10-CM

## 2011-07-12 DIAGNOSIS — F10929 Alcohol use, unspecified with intoxication, unspecified: Secondary | ICD-10-CM

## 2011-07-12 DIAGNOSIS — F329 Major depressive disorder, single episode, unspecified: Secondary | ICD-10-CM | POA: Insufficient documentation

## 2011-07-12 DIAGNOSIS — E039 Hypothyroidism, unspecified: Secondary | ICD-10-CM | POA: Insufficient documentation

## 2011-07-12 DIAGNOSIS — Z79899 Other long term (current) drug therapy: Secondary | ICD-10-CM | POA: Insufficient documentation

## 2011-07-12 DIAGNOSIS — S51809A Unspecified open wound of unspecified forearm, initial encounter: Secondary | ICD-10-CM | POA: Insufficient documentation

## 2011-07-12 HISTORY — DX: Depression, unspecified: F32.A

## 2011-07-12 HISTORY — DX: Major depressive disorder, single episode, unspecified: F32.9

## 2011-07-12 HISTORY — DX: Hypothyroidism, unspecified: E03.9

## 2011-07-12 LAB — DIFFERENTIAL
Basophils Absolute: 0 10*3/uL (ref 0.0–0.1)
Basophils Relative: 0 % (ref 0–1)
Eosinophils Absolute: 0.1 10*3/uL (ref 0.0–0.7)
Eosinophils Relative: 1 % (ref 0–5)
Lymphocytes Relative: 32 % (ref 12–46)
Lymphs Abs: 2.7 10*3/uL (ref 0.7–4.0)
Monocytes Absolute: 0.7 10*3/uL (ref 0.1–1.0)
Monocytes Relative: 8 % (ref 3–12)
Neutro Abs: 4.8 10*3/uL (ref 1.7–7.7)
Neutrophils Relative %: 57 % (ref 43–77)

## 2011-07-12 LAB — BASIC METABOLIC PANEL
BUN: 11 mg/dL (ref 6–23)
CO2: 28 mEq/L (ref 19–32)
Calcium: 8.8 mg/dL (ref 8.4–10.5)
Chloride: 103 mEq/L (ref 96–112)
Creatinine, Ser: 0.6 mg/dL (ref 0.50–1.10)
GFR calc Af Amer: >90 mL/min (ref >90)
GFR calc non Af Amer: >90 mL/min (ref >90)
Glucose, Bld: 121 mg/dL — ABNORMAL HIGH (ref 70–99)
Potassium: 3.7 mEq/L (ref 3.5–5.1)
Sodium: 140 mEq/L (ref 135–145)

## 2011-07-12 LAB — CBC
HCT: 38.8 % (ref 36.0–46.0)
Hemoglobin: 12.8 g/dL (ref 12.0–15.0)
MCH: 29.9 pg (ref 26.0–34.0)
MCHC: 33 g/dL (ref 30.0–36.0)
MCV: 90.7 fL (ref 78.0–100.0)
Platelets: 306 10*3/uL (ref 150–400)
RBC: 4.28 MIL/uL (ref 3.87–5.11)
RDW: 12.3 % (ref 11.5–15.5)
WBC: 8.4 10*3/uL (ref 4.0–10.5)

## 2011-07-12 LAB — RAPID URINE DRUG SCREEN, HOSP PERFORMED
Amphetamines: NOT DETECTED
Barbiturates: NOT DETECTED
Benzodiazepines: POSITIVE — AB
Cocaine: POSITIVE — AB
Opiates: NOT DETECTED
Tetrahydrocannabinol: NOT DETECTED

## 2011-07-12 LAB — TSH: TSH: 5.116 u[IU]/mL — ABNORMAL HIGH (ref 0.350–4.500)

## 2011-07-12 LAB — URINALYSIS, ROUTINE W REFLEX MICROSCOPIC
Bilirubin Urine: NEGATIVE
Glucose, UA: NEGATIVE mg/dL
Hgb urine dipstick: NEGATIVE
Ketones, ur: NEGATIVE mg/dL
Leukocytes, UA: NEGATIVE
Nitrite: NEGATIVE
Protein, ur: NEGATIVE mg/dL
Specific Gravity, Urine: 1.025 (ref 1.005–1.030)
Urobilinogen, UA: 0.2 mg/dL (ref 0.0–1.0)
pH: 6 (ref 5.0–8.0)

## 2011-07-12 LAB — PREGNANCY, URINE: Preg Test, Ur: NEGATIVE

## 2011-07-12 LAB — CARBAMAZEPINE LEVEL, TOTAL: Carbamazepine Lvl: <0.5 ug/mL — ABNORMAL LOW (ref 4.0–12.0)

## 2011-07-12 LAB — ETHANOL: Alcohol, Ethyl (B): 188 mg/dL — ABNORMAL HIGH (ref 0–11)

## 2011-07-12 MED ORDER — SERTRALINE HCL 50 MG PO TABS
100.0000 mg | ORAL_TABLET | Freq: Every day | ORAL | Status: DC
  Administered 2011-07-12: 100 mg via ORAL
  Filled 2011-07-12: qty 2

## 2011-07-12 MED ORDER — HYOSCYAMINE SULFATE ER 0.375 MG PO TB12
0.3750 mg | ORAL_TABLET | Freq: Three times a day (TID) | ORAL | Status: DC | PRN
  Filled 2011-07-12: qty 1

## 2011-07-12 MED ORDER — ACETAMINOPHEN 325 MG PO TABS
650.0000 mg | ORAL_TABLET | ORAL | Status: DC | PRN
  Administered 2011-07-12: 650 mg via ORAL
  Filled 2011-07-12: qty 2

## 2011-07-12 MED ORDER — ONDANSETRON HCL 4 MG PO TABS
4.0000 mg | ORAL_TABLET | Freq: Three times a day (TID) | ORAL | Status: DC | PRN
  Administered 2011-07-12: 4 mg via ORAL
  Filled 2011-07-12: qty 1

## 2011-07-12 MED ORDER — ACYCLOVIR 800 MG PO TABS: 800.0000 mg | ORAL_TABLET | Freq: Three times a day (TID) | ORAL | Status: DC

## 2011-07-12 MED ORDER — CHLORDIAZEPOXIDE HCL 10 MG PO CAPS: 10.0000 mg | ORAL_CAPSULE | Freq: Three times a day (TID) | ORAL | Status: DC

## 2011-07-12 MED ORDER — LORAZEPAM 1 MG PO TABS
ORAL_TABLET | ORAL | Status: AC
  Filled 2011-07-12: qty 2

## 2011-07-12 MED ORDER — LORAZEPAM 1 MG PO TABS
ORAL_TABLET | ORAL | Status: AC
  Administered 2011-07-12: 1 mg via ORAL
  Filled 2011-07-12: qty 1

## 2011-07-12 MED ORDER — CARBAMAZEPINE ER 200 MG PO CP12: 200.0000 mg | ORAL_CAPSULE | Freq: Two times a day (BID) | ORAL | Status: DC

## 2011-07-12 MED ORDER — LORAZEPAM 1 MG PO TABS
1.0000 mg | ORAL_TABLET | Freq: Once | ORAL | Status: AC
  Administered 2011-07-12: 1 mg via ORAL

## 2011-07-12 MED ORDER — LORAZEPAM 1 MG PO TABS
2.0000 mg | ORAL_TABLET | Freq: Once | ORAL | Status: AC
  Administered 2011-07-12: 2 mg via ORAL

## 2011-07-12 MED ORDER — HYOSCYAMINE SULFATE CR 0.375 MG PO CP12: 0.3750 mg | ORAL_CAPSULE | Freq: Three times a day (TID) | ORAL | Status: DC | PRN

## 2011-07-12 MED ORDER — HYOSCYAMINE SULFATE ER 0.375 MG PO TB12
0.3750 mg | ORAL_TABLET | Freq: Two times a day (BID) | ORAL | Status: DC | PRN
  Filled 2011-07-12: qty 1

## 2011-07-12 MED ORDER — IBUPROFEN 200 MG PO TABS: 600.0000 mg | ORAL_TABLET | Freq: Three times a day (TID) | ORAL | Status: DC | PRN

## 2011-07-12 NOTE — ED Notes (Signed)
Dc instructions reviewed w/ pt and mom-both verbalized understanding.  Pt encouraged to return/seek treatment for any suicidal thoughts or urges-pt verbalized understanding.  S/S of infection reviewed w/ pt.

## 2011-07-12 NOTE — ED Notes (Signed)
Pt alert/oriented x3, denies HI/AVH.  Pt reports that she cut herself w/ a knife last night in an attempt to harm herself.  Numerous superficial lacerations noted on both forearm's.  Pt also reports previous hx of SA by cutting wrists. Pt reports she has a hx of depression and has been taking her medications.   Pt also reports continued nausea and that she vomited the ativan given in triage-observed by her mother per pt not staff.  NAD, resting quietly, procedures explained, oriented to room and unit.

## 2011-07-12 NOTE — ED Notes (Signed)
Family at bedside. 

## 2011-07-12 NOTE — ED Notes (Signed)
Pt resting  Mother at  bedside

## 2011-07-12 NOTE — ED Notes (Signed)
1 bag locked in activity room 

## 2011-07-12 NOTE — ED Notes (Signed)
Diet tray delivered

## 2011-07-12 NOTE — ED Notes (Signed)
telepsych complete 

## 2011-07-12 NOTE — ED Notes (Signed)
Telepsych consult recommends discharge with follow up with pt's community psychiatrist. Pt desires to be discharged and MD is agreeable to disposition. Pt verbalized no other concerns at this time.

## 2011-07-12 NOTE — ED Notes (Signed)
Pt's mother into see 

## 2011-07-12 NOTE — ED Notes (Signed)
telepsych in progress.  NOte:  Pt's mother reports she did not take any of the pt's belongings w/ her when she left including clothes and medications.  Triage is aware and has not been able to locate them, charge nurse is aware and will continue to look for them

## 2011-07-12 NOTE — BH Assessment (Signed)
Assessment Note   Lori Marshall is an 35 y.o. female who presented to [facility] with the chief complaint of depression and recent SI attempt through laceration of inner arms. Pt stated to writer she is overwhelmed by the stress of being "perfect" for her parents. Pt reported she is suffering from depression and that her parents remarks "What are you doing with your life? You're too smart. You can do better." has driven her "over the edge". Pt reported previous SI attempts/gestures in the past, stating "I must not be very good at it (commiting suicide)." Pt denies HI/AVH and desires inpatient treatment at this time.  Axis I: Major Depression, Rec Axis II: Deferred Axis III:  Past Medical History  Diagnosis Date  . Hypothyroid   . Depression    Axis IV: problems with primary support group Axis V: 41-50 serious symptoms  Past Medical History:  Past Medical History  Diagnosis Date  . Hypothyroid   . Depression     History reviewed. No pertinent past surgical history.  Family History: History reviewed. No pertinent family history.  Social History:  reports that she has been smoking.  She does not have any smokeless tobacco history on file. She reports that she drinks alcohol. She reports that she does not use illicit drugs.  Allergies:  Allergies  Allergen Reactions  . Novocain Palpitations    Home Medications:  Medications Prior to Admission  Medication Dose Route Frequency Provider Last Rate Last Dose  . acetaminophen (TYLENOL) tablet 650 mg  650 mg Oral Q4H PRN Juliet Rude. Pickering, MD   650 mg at 07/12/11 1058  . acyclovir (ZOVIRAX) tablet 800 mg  800 mg Oral TID PC & HS Nathan R. Pickering, MD      . carbamazepine (CARBATROL) 12 hr capsule 200 mg  200 mg Oral BID Juliet Rude. Pickering, MD      . chlordiazePOXIDE (LIBRIUM) capsule 10 mg  10 mg Oral TID Juliet Rude. Pickering, MD      . hyoscyamine (LEVSINEX) 12 hr capsule 0.375 mg  0.375 mg Oral Q8H PRN Juliet Rude. Pickering, MD      .  ibuprofen (ADVIL,MOTRIN) tablet 600 mg  600 mg Oral Q8H PRN Juliet Rude. Pickering, MD      . LORazepam (ATIVAN) tablet 2 mg  2 mg Oral Once Harrold Donath R. Pickering, MD   2 mg at 07/12/11 0840  . ondansetron (ZOFRAN) tablet 4 mg  4 mg Oral Q8H PRN Juliet Rude. Pickering, MD   4 mg at 07/12/11 1059  . sertraline (ZOLOFT) tablet 100 mg  100 mg Oral Daily Juliet Rude. Pickering, MD   100 mg at 07/12/11 1133   No current outpatient prescriptions on file as of 07/12/2011.    OB/GYN Status:  No LMP recorded.  General Assessment Data Living Arrangements: Parent Can pt return to current living arrangement?: Yes Admission Status: Voluntary Is patient capable of signing voluntary admission?: Yes Transfer from: Acute Hospital Referral Source: Other ([facility])  Risk to self Suicidal Ideation: Yes-Currently Present Suicidal Intent: Yes-Currently Present Is patient at risk for suicide?: Yes Suicidal Plan?: Yes-Currently Present Specify Current Suicidal Plan: Pt attempted SI through laceration of inner arms Access to Means: Yes Specify Access to Suicidal Means: Access to objects What has been your use of drugs/alcohol within the last 12 months?: ETOH, daily, last drink was 07/13/11 (7 drinks of liquor) Triggers for Past Attempts: Other (Comment) (Depression, Relational Stressors ) Intentional Self Injurious Behavior: Cutting Comment - Self Injurious Behavior: Cutting  inner arms  Factors that decrease suicide risk: Positive social support Family Suicide History: No Persecutory voices/beliefs?: No Depression: Yes Depression Symptoms: Feeling worthless/self pity;Loss of interest in usual pleasures Substance abuse history and/or treatment for substance abuse?: Yes  Risk to Others Homicidal Ideation: No Thoughts of Harm to Others: No Current Homicidal Intent: No Current Homicidal Plan: No Access to Homicidal Means: No Identified Victim: N/A History of harm to others?: No Assessment of Violence: None  Noted Violent Behavior Description: None Noted Does patient have access to weapons?: No Criminal Charges Pending?: No Does patient have a court date: No  Mental Status Report Appear/Hygiene: Other (Comment) (casual) Eye Contact: Good Motor Activity: Other (Comment) (Normal) Speech:  (Normal) Level of Consciousness: Alert Mood: Depressed;Helpless Affect:  (Appropriate) Anxiety Level: Minimal Thought Processes: Coherent;Relevant Judgement: Impaired Orientation: Person;Place;Time;Situation Obsessive Compulsive Thoughts/Behaviors: None  Cognitive Functioning Concentration: Decreased Memory: Recent Intact IQ: Average Insight: Fair Impulse Control: Poor Appetite: Fair Sleep: No Change Total Hours of Sleep: 5  Vegetative Symptoms: None  Prior Inpatient/Outpatient Therapy Prior Therapy: Inpatient Prior Therapy Dates: 08/12 Prior Therapy Facilty/Provider(s): [facility] Reason for Treatment: SI/Depression            Values / Beliefs Cultural Requests During Hospitalization: None Spiritual Requests During Hospitalization: None        Additional Information 1:1 In Past 12 Months?: No CIRT Risk: No Elopement Risk: No Does patient have medical clearance?: Yes     Disposition: To be reviewed by [facility] for inpatient admission Disposition Disposition of Patient: Inpatient treatment program  On Site Evaluation by:   Reviewed with Physician:     Paulino Door, [name] 07/12/2011 11:49 AM

## 2011-07-12 NOTE — ED Notes (Signed)
Bobby ACT in to talk w/ pt and mother about follow up

## 2011-07-12 NOTE — ED Notes (Signed)
Up to the bathroom on arrival to unit 

## 2011-07-12 NOTE — ED Notes (Signed)
Dinner tray delivered.

## 2011-07-12 NOTE — ED Provider Notes (Signed)
History     CSN: [id number] Arrival date & time: 07/12/2011  4:38 AM   First MD Initiated Contact with Patient 07/12/11 [phone]      Chief Complaint  Patient presents with  . Psychiatric Evaluation  . Suicidal    (Consider location/radiation/quality/duration/timing/severity/associated sxs/prior treatment) The history is provided by the patient. The history is limited by the condition of the patient (Patient is somewhat uncooperative with the history and physical.).   patient was brought in after she called EMS after an apparent suicide attempt. She superficially cut herself on both arms a kitchen knife. Her tetanus is up-to-date. She's history depression and she was originally told she has some hypothyroidism. She's been supplemented for that. She states that she wanted to die but didn't cut herself hard enough. She's been depressed for a while. She states she is on some pressor family can't do that anymore. She states that she drank 4 beers tonight. She's previously been inpatient behavioral health for similar symptoms.  Past Medical History  Diagnosis Date  . Hypothyroid   . Depression     History reviewed. No pertinent past surgical history.  History reviewed. No pertinent family history.  History  Substance Use Topics  . Smoking status: Current Everyday Smoker  . Smokeless tobacco: Not on file  . Alcohol Use: Yes    OB History    Grav Para Term Preterm Abortions TAB SAB Ect Mult Living                  Review of Systems  Unable to perform ROS: Psychiatric disorder     Allergies  Novocain  Home Medications   Current Outpatient Rx  Name Route Sig Dispense Refill  . ACYCLOVIR 800 MG PO TABS Oral Take 800 mg by mouth 4 (four) times daily - after meals and at bedtime.     [name] CARBAMAZEPINE 200 MG PO CP12 Oral Take 200 mg by mouth 2 (two) times daily.      [name] CLONAZEPAM 2 MG PO TABS Oral Take 2 mg by mouth 4 (four) times daily.      [name] HYOSCYAMINE SULFATE ER 0.375 MG PO  CP12 Oral Take 0.375 mg by mouth 2 (two) times daily as needed.     [name] METOCLOPRAMIDE HCL 10 MG PO TABS Oral Take 10 mg by mouth 2 (two) times daily.      [name] AQUAPHOR EX OINT Topical Apply 1 application topically 2 (two) times daily as needed. For dry skin.     Carma Leaven M PLUS PO TABS Oral Take 1 tablet by mouth daily.      [name] OMEPRAZOLE 20 MG PO CPDR Oral Take 20 mg by mouth daily.      . SERTRALINE HCL 100 MG PO TABS Oral Take 100 mg by mouth daily.       BP 95/60  Pulse 89  Temp(Src) 97.3 F (36.3 C) (Oral)  Resp 20  SpO2 94%  Physical Exam  Constitutional: She is oriented to person, place, and time. She appears well-developed and well-nourished.  HENT:  Head: Normocephalic and atraumatic.  Eyes: Pupils are equal, round, and reactive to light.  Cardiovascular: Normal rate and regular rhythm.   Pulmonary/Chest: Effort normal and breath sounds normal.  Abdominal: Soft. Bowel sounds are normal.  Musculoskeletal:       Superficial lacerations on forearms neurovascularly intact  Neurological: She is alert and oriented to person, place, and time.       Patient appears intoxicated.  Skin:  Skin is warm.       Multiple superficial vertical lacerations/abrasions to bilateral volar forearms. No active bleeding.    ED Course  Procedures (including critical care time)  Labs Reviewed  URINE RAPID DRUG SCREEN (HOSP PERFORMED) - Abnormal; Notable for the following:    Cocaine POSITIVE (*)    Benzodiazepines POSITIVE (*)    All other components within normal limits  BASIC METABOLIC PANEL - Abnormal; Notable for the following:    Glucose, Bld 121 (*)    All other components within normal limits  ETHANOL - Abnormal; Notable for the following:    Alcohol, Ethyl (B) 188 (*)    All other components within normal limits  CARBAMAZEPINE LEVEL, TOTAL - Abnormal; Notable for the following:    Carbamazepine Lvl <0.5 (*)    All other components within normal limits  TSH - Abnormal; Notable for the  following:    TSH 5.116 (*)    All other components within normal limits  CBC  DIFFERENTIAL  URINALYSIS, ROUTINE W REFLEX MICROSCOPIC  PREGNANCY, URINE   No results found.   1. Depression   2. Alcohol intoxication     I was called to patient's bedside. She was hyperventilating after having a blood draw. Her hands were contracted she frequent shallow breaths. She was somewhat uncooperative but responsive. She Fixing her hair over her here. She is given some oral Ativan. Reassurance. She'll be monitored.  MDM  Depression. With the potential suicide attempt. She has superficial cuts on her forearms. More of abrasions. She initially was very depressed, as she became more sober she became more responsive and more appropriate. She wants to go home. She had a total psych consult as stated she is not a risk for self. She has family support and followup. She'll be discharged home.        Juliet Rude. Rubin Payor, MD 07/12/11 [phone]

## 2011-07-12 NOTE — ED Notes (Signed)
Resting quietly

## 2011-07-12 NOTE — ED Notes (Signed)
Family at bedside. Pt reports increased anxiety and is requesting additional meds.  Mom was able to confirm that the pt vomited in triage shortly after taking the medications.  Dr Rubin Payor updated-repeat ativan x1 mg TORP

## 2011-07-12 NOTE — ED Notes (Signed)
Resting quielty 

## 2011-07-12 NOTE — ED Notes (Signed)
Greg CSW in w/ pt 

## 2011-07-12 NOTE — ED Notes (Signed)
Family at bedside   resting quietly

## 2011-07-12 NOTE — ED Notes (Signed)
Report called to Wille Celeste, RN in [place] [facility]
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
## 2011-07-14 ENCOUNTER — Encounter: Payer: Self-pay | Admitting: Physician Assistant

## 2011-07-14 ENCOUNTER — Emergency Department (HOSPITAL_COMMUNITY)
Admission: EM | Admit: 2011-07-14 | Discharge: 2011-07-14 | Disposition: A | Discharge status: Home / Self Care | Payer: Medicaid Other | Attending: Emergency Medicine | Admitting: Emergency Medicine

## 2011-07-14 ENCOUNTER — Encounter (HOSPITAL_COMMUNITY): Payer: Self-pay | Admitting: Emergency Medicine

## 2011-07-14 DIAGNOSIS — E039 Hypothyroidism, unspecified: Secondary | ICD-10-CM | POA: Insufficient documentation

## 2011-07-14 DIAGNOSIS — IMO0002 Reserved for concepts with insufficient information to code with codable children: Secondary | ICD-10-CM | POA: Insufficient documentation

## 2011-07-14 DIAGNOSIS — S51809A Unspecified open wound of unspecified forearm, initial encounter: Secondary | ICD-10-CM | POA: Insufficient documentation

## 2011-07-14 DIAGNOSIS — F17200 Nicotine dependence, unspecified, uncomplicated: Secondary | ICD-10-CM | POA: Insufficient documentation

## 2011-07-14 DIAGNOSIS — T148XXA Other injury of unspecified body region, initial encounter: Secondary | ICD-10-CM

## 2011-07-14 DIAGNOSIS — S40812A Abrasion of left upper arm, initial encounter: Secondary | ICD-10-CM

## 2011-07-14 LAB — COMPREHENSIVE METABOLIC PANEL
ALT: 27 U/L (ref 0–35)
AST: 22 U/L (ref 0–37)
Albumin: 3.6 g/dL (ref 3.5–5.2)
Alkaline Phosphatase: 81 U/L (ref 39–117)
BUN: 9 mg/dL (ref 6–23)
CO2: 23 mEq/L (ref 19–32)
Calcium: 8.6 mg/dL (ref 8.4–10.5)
Chloride: 104 mEq/L (ref 96–112)
Creatinine, Ser: 0.62 mg/dL (ref 0.50–1.10)
GFR calc Af Amer: >90 mL/min (ref >90)
GFR calc non Af Amer: >90 mL/min (ref >90)
Glucose, Bld: 109 mg/dL — ABNORMAL HIGH (ref 70–99)
Potassium: 4.2 mEq/L (ref 3.5–5.1)
Sodium: 136 mEq/L (ref 135–145)
Total Bilirubin: 0.2 mg/dL — ABNORMAL LOW (ref 0.3–1.2)
Total Protein: 6.6 g/dL (ref 6.0–8.3)

## 2011-07-14 LAB — CBC
HCT: 36.4 % (ref 36.0–46.0)
Hemoglobin: 12.1 g/dL (ref 12.0–15.0)
MCH: 30.2 pg (ref 26.0–34.0)
MCHC: 33.2 g/dL (ref 30.0–36.0)
MCV: 90.8 fL (ref 78.0–100.0)
Platelets: 309 10*3/uL (ref 150–400)
RBC: 4.01 MIL/uL (ref 3.87–5.11)
RDW: 12 % (ref 11.5–15.5)
WBC: 5.8 10*3/uL (ref 4.0–10.5)

## 2011-07-14 LAB — ETHANOL: Alcohol, Ethyl (B): <11 mg/dL (ref 0–11)

## 2011-07-14 NOTE — ED Provider Notes (Signed)
History    Pt presents complaining of laceration and abrasion to the left forearm.  Dates she was doing arts and crafts with her nephew yesterday when left forearm was brushed against a sand paper machine.  She has vertical lacerations that continues to bleed.  She denies purposely trying to harm herself. She denies homicidal or suicidal ideation.  She denies weakness or numbness.  CSN: [id number] Arrival date & time: 07/14/2011  5:09 PM   First MD Initiated Contact with Patient 07/14/11 1722      Chief Complaint  Patient presents with  . Laceration     self injury    (Consider location/radiation/quality/duration/timing/severity/associated sxs/prior treatment) HPI  Past Medical History  Diagnosis Date  . Migraine, unspecified, without mention of intractable migraine without mention of status migrainosus   . Vomiting alone   . Irritable bowel syndrome   . Esophageal reflux   . Diarrhea   . Depressive disorder, not elsewhere classified   . Panic disorder without agoraphobia   . Hypothyroid   . Depression     Past Surgical History  Procedure Date  . Wisdom tooth extraction   . Appendectomy     Family History  Problem Relation Age of Onset  . Diabetes Father   . Melanoma Father   . Colon polyps Father   . Colon cancer Neg Hx     History  Substance Use Topics  . Smoking status: Current Everyday Smoker    Last Attempt to Quit: 10/31/2010  . Smokeless tobacco: Not on file  . Alcohol Use: Yes    OB History    Grav Para Term Preterm Abortions TAB SAB Ect Mult Living                  Review of Systems  All other systems reviewed and are negative.    Allergies  Dust mite extract and Novocain  Home Medications   Current Outpatient Rx  Name Route Sig Dispense Refill  . ACYCLOVIR 800 MG PO TABS Oral Take 800 mg by mouth daily as needed. For fever blisters.    [name] CLONAZEPAM 2 MG PO TABS Oral Take 2 mg by mouth 3 (three) times daily as needed. For anxiety.    .  ESOMEPRAZOLE MAGNESIUM 40 MG PO CPDR  Take 1 cap twice daily. Take 1 30 min before breakfast and dinner.  Lot # [id number] Exp date: 11/2013 30 capsule 0  . HYOSCYAMINE SULFATE ER 0.375 MG PO CP12 Oral Take 0.375 mg by mouth 2 (two) times daily as needed.     [name] LEVOTHYROXINE SODIUM 200 MCG PO TABS Oral Take 200 mcg by mouth daily.      [name] METOCLOPRAMIDE HCL 10 MG PO TABS Oral Take 10 mg by mouth 2 (two) times daily.     Carma Leaven M PLUS PO TABS Oral Take 1 tablet by mouth daily.      . SERTRALINE HCL 100 MG PO TABS Oral Take 200 mg by mouth daily.       BP 119/86  Pulse 78  Temp(Src) 97.8 F (36.6 C) (Oral)  Resp 16  Wt 159 lb 4 oz (72.235 kg)  SpO2 98%  LMP 07/12/2011  Physical Exam  Constitutional: She is oriented to person, place, and time. She appears well-developed and well-nourished.  HENT:  Head: Normocephalic and atraumatic.  Eyes: Conjunctivae are normal.  Neck: Normal range of motion. Neck supple.  Musculoskeletal: Normal range of motion.       L Forearm: A  patch of vertical skin abrasion (4 inches) noted to L forearm with 1 superficial laceration (5cm).  No fb, or obvious deformity noted.  Mildly tender on palpation.  Radial pulse 2 + and brisk cap refills distally.  Sensation intact.  Neurological: She is alert and oriented to person, place, and time.  Skin: Skin is warm and dry.    ED Course  LACERATION REPAIR Date/Time: 07/14/2011 5:50 PM Performed by: Fayrene Helper Authorized by: Laray Anger Consent: Verbal consent obtained. Written consent not obtained. Risks and benefits: risks, benefits and alternatives were discussed Consent given by: patient Patient identity confirmed: verbally with patient Location: left forearm. Laceration length: 5 cm Foreign bodies: unknown Tendon involvement: none Nerve involvement: none Vascular damage: no Anesthesia method: none. Patient sedated: no Irrigation solution: saline Irrigation method: tap Amount of cleaning:  standard Debridement: none Degree of undermining: none Skin closure: Steri-Strips Patient tolerance: Patient tolerated the procedure well with no immediate complications.   (including critical care time)  Labs Reviewed  COMPREHENSIVE METABOLIC PANEL - Abnormal; Notable for the following:    Glucose, Bld 109 (*)    Total Bilirubin 0.2 (*)    All other components within normal limits  CBC  ETHANOL  URINE RAPID DRUG SCREEN (HOSP PERFORMED)  POCT PREGNANCY, URINE   No results found.   No diagnosis found.    MDM  Left forearm with an abrasion and superficial laceration which were repaired with Steri-Strips. To date with her tetanus shot. Instruction given.        Fayrene Helper, PA 07/14/11 1753

## 2011-07-14 NOTE — ED Notes (Signed)
Pt noted to have old lacerations to L arm and new cut areas. Pt states not suicidal, then states she is, pt states cut arm with knife because it hurt.

## 2011-07-14 NOTE — ED Notes (Signed)
Pt states "I was not trying to hurt myself, she just gave me those scrubs and told me to put them on".

## 2011-07-15 NOTE — ED Provider Notes (Signed)
Medical screening examination/treatment/procedure(s) were performed by non-physician practitioner and as supervising physician I was immediately available for consultation/collaboration.   Laray Anger, DO 07/15/11 [phone]

## 2011-07-17 ENCOUNTER — Emergency Department (HOSPITAL_COMMUNITY)
Admission: EM | Admit: 2011-07-17 | Discharge: 2011-07-17 | Disposition: A | Discharge status: Home / Self Care | Payer: Medicaid Other | Attending: Emergency Medicine | Admitting: Emergency Medicine

## 2011-07-17 ENCOUNTER — Encounter (HOSPITAL_COMMUNITY): Payer: Self-pay

## 2011-07-17 ENCOUNTER — Emergency Department (HOSPITAL_COMMUNITY): Payer: Medicaid Other

## 2011-07-17 DIAGNOSIS — K589 Irritable bowel syndrome without diarrhea: Secondary | ICD-10-CM | POA: Insufficient documentation

## 2011-07-17 DIAGNOSIS — M62838 Other muscle spasm: Secondary | ICD-10-CM | POA: Insufficient documentation

## 2011-07-17 DIAGNOSIS — F17200 Nicotine dependence, unspecified, uncomplicated: Secondary | ICD-10-CM | POA: Insufficient documentation

## 2011-07-17 DIAGNOSIS — Y92410 Unspecified street and highway as the place of occurrence of the external cause: Secondary | ICD-10-CM | POA: Insufficient documentation

## 2011-07-17 DIAGNOSIS — G44209 Tension-type headache, unspecified, not intractable: Secondary | ICD-10-CM

## 2011-07-17 DIAGNOSIS — E039 Hypothyroidism, unspecified: Secondary | ICD-10-CM | POA: Insufficient documentation

## 2011-07-17 MED ORDER — HYDROCODONE-ACETAMINOPHEN 5-500 MG PO TABS: 1.0000 | ORAL_TABLET | Freq: Every day | ORAL | Status: DC

## 2011-07-17 MED ORDER — PROCHLORPERAZINE EDISYLATE 5 MG/ML IJ SOLN: 10.0000 mg | Freq: Once | INTRAMUSCULAR | Status: DC

## 2011-07-17 MED ORDER — DIAZEPAM 5 MG PO TABS
5.0000 mg | ORAL_TABLET | Freq: Once | ORAL | Status: AC
  Administered 2011-07-17: 5 mg via ORAL
  Filled 2011-07-17: qty 1

## 2011-07-17 MED ORDER — HYDROMORPHONE HCL PF 1 MG/ML IJ SOLN
0.5000 mg | Freq: Once | INTRAMUSCULAR | Status: DC
  Filled 2011-07-17: qty 1

## 2011-07-17 MED ORDER — DIPHENHYDRAMINE HCL 50 MG/ML IJ SOLN
25.0000 mg | Freq: Once | INTRAMUSCULAR | Status: AC
  Administered 2011-07-17: 25 mg via INTRAVENOUS
  Filled 2011-07-17: qty 1

## 2011-07-17 MED ORDER — DEXAMETHASONE SODIUM PHOSPHATE 10 MG/ML IJ SOLN
10.0000 mg | Freq: Once | INTRAMUSCULAR | Status: AC
  Administered 2011-07-17: 10 mg via INTRAVENOUS
  Filled 2011-07-17: qty 1

## 2011-07-17 MED ORDER — ONDANSETRON 4 MG PO TBDP
4.0000 mg | ORAL_TABLET | Freq: Once | ORAL | Status: AC
  Administered 2011-07-17: 4 mg via ORAL
  Filled 2011-07-17: qty 1

## 2011-07-17 MED ORDER — SODIUM CHLORIDE 0.9 % IV BOLUS (SEPSIS)
1000.0000 mL | Freq: Once | INTRAVENOUS | Status: AC
  Administered 2011-07-17: 1000 mL via INTRAVENOUS

## 2011-07-17 MED ORDER — METOCLOPRAMIDE HCL 5 MG/ML IJ SOLN
10.0000 mg | Freq: Once | INTRAMUSCULAR | Status: AC
  Administered 2011-07-17: 10 mg via INTRAVENOUS
  Filled 2011-07-17: qty 2

## 2011-07-17 MED ORDER — CYCLOBENZAPRINE HCL 10 MG PO TABS: 10.0000 mg | ORAL_TABLET | Freq: Three times a day (TID) | ORAL | Status: DC | PRN

## 2011-07-17 MED ORDER — HYDROMORPHONE HCL PF 1 MG/ML IJ SOLN
1.0000 mg | Freq: Once | INTRAMUSCULAR | Status: AC
  Administered 2011-07-17: 1 mg via INTRAVENOUS
  Filled 2011-07-17: qty 1

## 2011-07-17 NOTE — ED Notes (Signed)
Watching TV with no display of sensitivity or pain
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
## 2011-07-19 NOTE — ED Provider Notes (Signed)
  I performed a history and physical examination of Lori Marshall and discussed her management with Dr. Dorise Hiss.  I agree with the history, physical, assessment, and plan of care, with the following exceptions: None  I was present for the following procedures: None Time Spent in Critical Care of the patient: None Time spent in discussions with the patient and family: 5 minutes  Vasili Fok D    Felisa Bonier, MD 07/19/11 [id number]

## 2011-07-25 DIAGNOSIS — R0602 Shortness of breath: Secondary | ICD-10-CM | POA: Insufficient documentation

## 2011-07-25 DIAGNOSIS — F17200 Nicotine dependence, unspecified, uncomplicated: Secondary | ICD-10-CM | POA: Insufficient documentation

## 2011-07-25 DIAGNOSIS — K8020 Calculus of gallbladder without cholecystitis without obstruction: Secondary | ICD-10-CM | POA: Insufficient documentation

## 2011-07-25 DIAGNOSIS — R109 Unspecified abdominal pain: Secondary | ICD-10-CM | POA: Insufficient documentation

## 2011-07-25 DIAGNOSIS — E039 Hypothyroidism, unspecified: Secondary | ICD-10-CM | POA: Insufficient documentation

## 2011-07-25 DIAGNOSIS — R10819 Abdominal tenderness, unspecified site: Secondary | ICD-10-CM | POA: Insufficient documentation

## 2011-07-26 ENCOUNTER — Encounter (HOSPITAL_COMMUNITY): Payer: Self-pay | Admitting: Emergency Medicine

## 2011-07-26 ENCOUNTER — Emergency Department (HOSPITAL_COMMUNITY): Payer: PRIVATE HEALTH INSURANCE

## 2011-07-26 ENCOUNTER — Emergency Department (HOSPITAL_COMMUNITY)
Admission: EM | Admit: 2011-07-26 | Discharge: 2011-07-26 | Disposition: A | Discharge status: Home / Self Care | Payer: PRIVATE HEALTH INSURANCE | Attending: Emergency Medicine | Admitting: Emergency Medicine

## 2011-07-26 ENCOUNTER — Other Ambulatory Visit: Payer: Self-pay

## 2011-07-26 DIAGNOSIS — K8050 Calculus of bile duct without cholangitis or cholecystitis without obstruction: Secondary | ICD-10-CM

## 2011-07-26 LAB — URINALYSIS, ROUTINE W REFLEX MICROSCOPIC
Bilirubin Urine: NEGATIVE
Glucose, UA: NEGATIVE mg/dL
Hgb urine dipstick: NEGATIVE
Ketones, ur: NEGATIVE mg/dL
Leukocytes, UA: NEGATIVE
Nitrite: NEGATIVE
Protein, ur: NEGATIVE mg/dL
Specific Gravity, Urine: 1.018 (ref 1.005–1.030)
Urobilinogen, UA: 0.2 mg/dL (ref 0.0–1.0)
pH: 5.5 (ref 5.0–8.0)

## 2011-07-26 LAB — DIFFERENTIAL
Basophils Absolute: 0.1 10*3/uL (ref 0.0–0.1)
Basophils Relative: 1 % (ref 0–1)
Eosinophils Absolute: 0.2 10*3/uL (ref 0.0–0.7)
Eosinophils Relative: 3 % (ref 0–5)
Lymphocytes Relative: 37 % (ref 12–46)
Lymphs Abs: 2.7 10*3/uL (ref 0.7–4.0)
Monocytes Absolute: 0.7 10*3/uL (ref 0.1–1.0)
Monocytes Relative: 10 % (ref 3–12)
Neutro Abs: 3.6 10*3/uL (ref 1.7–7.7)
Neutrophils Relative %: 49 % (ref 43–77)

## 2011-07-26 LAB — COMPREHENSIVE METABOLIC PANEL
ALT: 52 U/L — ABNORMAL HIGH (ref 0–35)
AST: 32 U/L (ref 0–37)
Albumin: 3.6 g/dL (ref 3.5–5.2)
Alkaline Phosphatase: 93 U/L (ref 39–117)
BUN: 9 mg/dL (ref 6–23)
CO2: 27 mEq/L (ref 19–32)
Calcium: 9.7 mg/dL (ref 8.4–10.5)
Chloride: 98 mEq/L (ref 96–112)
Creatinine, Ser: 0.62 mg/dL (ref 0.50–1.10)
GFR calc Af Amer: >90 mL/min (ref >90)
GFR calc non Af Amer: >90 mL/min (ref >90)
Glucose, Bld: 98 mg/dL (ref 70–99)
Potassium: 3.9 mEq/L (ref 3.5–5.1)
Sodium: 134 mEq/L — ABNORMAL LOW (ref 135–145)
Total Bilirubin: 0.1 mg/dL — ABNORMAL LOW (ref 0.3–1.2)
Total Protein: 6.7 g/dL (ref 6.0–8.3)

## 2011-07-26 LAB — CBC
HCT: 35.4 % — ABNORMAL LOW (ref 36.0–46.0)
Hemoglobin: 12.1 g/dL (ref 12.0–15.0)
MCH: 30.7 pg (ref 26.0–34.0)
MCHC: 34.2 g/dL (ref 30.0–36.0)
MCV: 89.8 fL (ref 78.0–100.0)
Platelets: 315 10*3/uL (ref 150–400)
RBC: 3.94 MIL/uL (ref 3.87–5.11)
RDW: 12.4 % (ref 11.5–15.5)
WBC: 7.3 10*3/uL (ref 4.0–10.5)

## 2011-07-26 LAB — POCT PREGNANCY, URINE: Preg Test, Ur: NEGATIVE

## 2011-07-26 LAB — LIPASE, BLOOD: Lipase: 23 U/L (ref 11–59)

## 2011-07-26 MED ORDER — LORAZEPAM 2 MG/ML IJ SOLN
1.0000 mg | Freq: Once | INTRAMUSCULAR | Status: AC
  Administered 2011-07-26: 1 mg via INTRAVENOUS
  Filled 2011-07-26: qty 1

## 2011-07-26 MED ORDER — MORPHINE SULFATE 4 MG/ML IJ SOLN
4.0000 mg | Freq: Once | INTRAMUSCULAR | Status: AC
  Administered 2011-07-26: 4 mg via INTRAVENOUS
  Filled 2011-07-26: qty 1

## 2011-07-26 MED ORDER — ONDANSETRON HCL 4 MG/2ML IJ SOLN
4.0000 mg | Freq: Once | INTRAMUSCULAR | Status: AC
  Administered 2011-07-26: 4 mg via INTRAVENOUS
  Filled 2011-07-26: qty 2

## 2011-07-26 MED ORDER — SODIUM CHLORIDE 0.9 % IV BOLUS (SEPSIS)
1000.0000 mL | Freq: Once | INTRAVENOUS | Status: AC
  Administered 2011-07-26: 1000 mL via INTRAVENOUS

## 2011-07-26 MED ORDER — HYDROCODONE-ACETAMINOPHEN 5-325 MG PO TABS: 2.0000 | ORAL_TABLET | ORAL | Status: AC | PRN

## 2011-07-26 NOTE — ED Notes (Addendum)
Pt presents with RUQ abdominal pain that radiates to lower back. Pt states pain began a couple days ago but became worse tonight. Pt has been seen by MD for similar problem but was told to come here if symptoms worsened. Pt states pain became so bad she had difficulty breathing.

## 2011-07-26 NOTE — ED Provider Notes (Signed)
Medical screening examination/treatment/procedure(s) were performed by non-physician practitioner and as supervising physician I was immediately available for consultation/collaboration.  Zamira Hickam M Sefora Tietje, MD 07/26/11 0802 

## 2011-07-26 NOTE — ED Provider Notes (Signed)
History     CSN: [id number] Arrival date & time: 07/26/2011 12:44 AM   First MD Initiated Contact with Patient 07/26/11 0207      Chief Complaint  Patient presents with  . Shortness of Breath    sent by dr for poss PE (Sp02 96 on RA)   . Abdominal Pain    epigastric   Patient is a 35 y.o. female presenting with shortness of breath and abdominal pain.  Shortness of Breath  Associated symptoms include shortness of breath. Pertinent negatives include no chest pain, no fever and no cough.  Abdominal Pain The primary symptoms of the illness include abdominal pain, shortness of breath and nausea. The primary symptoms of the illness do not include fever, fatigue, vomiting or dysuria.  Additional symptoms associated with the illness include frequency. Symptoms associated with the illness do not include chills or hematuria.   History provider the patient. Patient presents with complaints of right upper quadrant pain radiates around to the back. Patient states pain has been present for the past several days. She was seen by PCP 3 days ago and states that she did mention the symptoms. She was also evaluated at that time for worsening migraine headaches. She states that she was started on a new medication for her headaches. Patient also reports a history of daily episodes of vomiting when she wakes up in the morning.   Past Medical History  Diagnosis Date  . Migraine, unspecified, without mention of intractable migraine without mention of status migrainosus   . Vomiting alone   . Irritable bowel syndrome   . Esophageal reflux   . Diarrhea   . Depressive disorder, not elsewhere classified   . Panic disorder without agoraphobia   . Hypothyroid   . Depression     Past Surgical History  Procedure Date  . Wisdom tooth extraction   . Appendectomy     Family History  Problem Relation Age of Onset  . Diabetes Father   . Melanoma Father   . Colon polyps Father   . Colon cancer Neg Hx      History  Substance Use Topics  . Smoking status: Current Some Day Smoker -- 0.5 packs/day for 5 years    Types: Cigarettes  . Smokeless tobacco: Not on file  . Alcohol Use: Yes    OB History    Grav Para Term Preterm Abortions TAB SAB Ect Mult Living                  Review of Systems  Constitutional: Negative for fever, chills and fatigue.  Respiratory: Positive for shortness of breath. Negative for cough.   Cardiovascular: Negative for chest pain and palpitations.  Gastrointestinal: Positive for nausea and abdominal pain. Negative for vomiting.  Genitourinary: Positive for frequency. Negative for dysuria and hematuria.  All other systems reviewed and are negative.    Allergies  Dust mite extract and Novocain  Home Medications   Current Outpatient Rx  Name Route Sig Dispense Refill  . BUTALBITAL-APAP-CAFF-COD 50-325-40-30 MG PO CAPS Oral Take 1-2 capsules by mouth every 4 (four) hours as needed. As needed for headache     . CLONAZEPAM 2 MG PO TABS Oral Take 2 mg by mouth 3 (three) times daily as needed. For anxiety. Takes a half to one tablet my by mouth as needed    . CYCLOBENZAPRINE HCL 10 MG PO TABS Oral Take 10 mg by mouth 3 (three) times daily as needed. Takes a half to  1 tablet as needed for muscle spasm     . ESOMEPRAZOLE MAGNESIUM 40 MG PO CPDR Oral Take 40 mg by mouth daily as needed. For stomach upset.    [name] HYDROCODONE-ACETAMINOPHEN 5-500 MG PO TABS Oral Take 1 tablet by mouth at bedtime. 20 tablet 0  . HYDROXYZINE HCL 50 MG PO TABS Oral Take 50 mg by mouth 3 (three) times daily as needed.      [name] HYOSCYAMINE SULFATE ER 0.375 MG PO CP12 Oral Take 0.375 mg by mouth 2 (two) times daily as needed. For stomach pain. Takes one to two tablet by mouth .    [name] LEVOTHYROXINE SODIUM 50 MCG PO TABS Oral Take 50 mcg by mouth daily.     [name] METOCLOPRAMIDE HCL 10 MG PO TABS Oral Take 10 mg by mouth at bedtime and may repeat dose one time if needed. For cramping.    . ADULT  GUMMY PO CHEW Oral Chew 1 tablet by mouth.      [name] PROMETHAZINE HCL 25 MG PO TABS Oral Take 25 mg by mouth every 6 (six) hours as needed. For nausea.    . SERTRALINE HCL 100 MG PO TABS Oral Take 200 mg by mouth daily.     . TRAMADOL HCL 50 MG PO TABS Oral Take 50 mg by mouth every 6 (six) hours as needed. Maximum dose= 8 tablets per day-FOR PAIN    . ACYCLOVIR 800 MG PO TABS Oral Take 800 mg by mouth daily as needed. For fever blisters.    Carma Leaven M PLUS PO TABS Oral Take 1 tablet by mouth daily.        BP 112/75  Pulse 87  Temp(Src) 97.4 F (36.3 C) (Oral)  Resp 18  SpO2 96%  LMP 07/12/2011  Physical Exam  Nursing note and vitals reviewed. Constitutional: She is oriented to person, place, and time. She appears well-developed and well-nourished. No distress.  HENT:  Head: Normocephalic.  Cardiovascular: Normal rate and regular rhythm.  Exam reveals no friction rub.   No murmur heard. Pulmonary/Chest: Effort normal. She has no wheezes. She has no rales.  Abdominal: Soft. There is no hepatosplenomegaly. There is tenderness in the right upper quadrant and epigastric area. There is no rebound, no guarding, no CVA tenderness, no tenderness at McBurney's point and negative Murphy's sign.  Neurological: She is alert and oriented to person, place, and time.  Skin: Skin is warm.  Psychiatric: She has a normal mood and affect.    ED Course  Procedures (including critical care time)   Labs Reviewed  CBC  DIFFERENTIAL  COMPREHENSIVE METABOLIC PANEL  LIPASE, BLOOD   Results for orders placed during the hospital encounter of 07/26/11  CBC      Component Value Range   WBC 7.3  4.0 - 10.5 (K/uL)   RBC 3.94  3.87 - 5.11 (MIL/uL)   Hemoglobin 12.1  12.0 - 15.0 (g/dL)   HCT 16.1 (*) 09.6 - 46.0 (%)   MCV 89.8  78.0 - 100.0 (fL)   MCH 30.7  26.0 - 34.0 (pg)   MCHC 34.2  30.0 - 36.0 (g/dL)   RDW 04.5  40.9 - 81.1 (%)   Platelets 315  150 - 400 (K/uL)  DIFFERENTIAL      Component Value  Range   Neutrophils Relative 49  43 - 77 (%)   Lymphocytes Relative 37  12 - 46 (%)   Monocytes Relative 10  3 - 12 (%)   Eosinophils Relative  3  0 - 5 (%)   Basophils Relative 1  0 - 1 (%)   Neutro Abs 3.6  1.7 - 7.7 (K/uL)   Lymphs Abs 2.7  0.7 - 4.0 (K/uL)   Monocytes Absolute 0.7  0.1 - 1.0 (K/uL)   Eosinophils Absolute 0.2  0.0 - 0.7 (K/uL)   Basophils Absolute 0.1  0.0 - 0.1 (K/uL)   WBC Morphology TOXIC GRANULATION    COMPREHENSIVE METABOLIC PANEL      Component Value Range   Sodium 134 (*) 135 - 145 (mEq/L)   Potassium 3.9  3.5 - 5.1 (mEq/L)   Chloride 98  96 - 112 (mEq/L)   CO2 27  19 - 32 (mEq/L)   Glucose, Bld 98  70 - 99 (mg/dL)   BUN 9  6 - 23 (mg/dL)   Creatinine, Ser 1.61  0.50 - 1.10 (mg/dL)   Calcium 9.7  8.4 - 09.6 (mg/dL)   Total Protein 6.7  6.0 - 8.3 (g/dL)   Albumin 3.6  3.5 - 5.2 (g/dL)   AST 32  0 - 37 (U/L)   ALT 52 (*) 0 - 35 (U/L)   Alkaline Phosphatase 93  39 - 117 (U/L)   Total Bilirubin 0.1 (*) 0.3 - 1.2 (mg/dL)   GFR calc non Af Amer >90  >90 (mL/min)   GFR calc Af Amer >90  >90 (mL/min)  LIPASE, BLOOD      Component Value Range   Lipase 23  11 - 59 (U/L)  URINALYSIS, ROUTINE W REFLEX MICROSCOPIC      Component Value Range   Color, Urine YELLOW  YELLOW    Appearance CLOUDY (*) CLEAR    Specific Gravity, Urine 1.018  1.005 - 1.030    pH 5.5  5.0 - 8.0    Glucose, UA NEGATIVE  NEGATIVE (mg/dL)   Hgb urine dipstick NEGATIVE  NEGATIVE    Bilirubin Urine NEGATIVE  NEGATIVE    Ketones, ur NEGATIVE  NEGATIVE (mg/dL)   Protein, ur NEGATIVE  NEGATIVE (mg/dL)   Urobilinogen, UA 0.2  0.0 - 1.0 (mg/dL)   Nitrite NEGATIVE  NEGATIVE    Leukocytes, UA NEGATIVE  NEGATIVE   POCT PREGNANCY, URINE      Component Value Range   Preg Test, Ur NEGATIVE       US Abdomen Complete  07/26/2011  *RADIOLOGY REPORT*  Clinical Data:  Right upper quadrant abdominal pain.  Assess for gallstones.  ABDOMINAL ULTRASOUND COMPLETE  Comparison:  CT of the abdomen and  pelvis performed 12/30/2010  Findings:  Gallbladder:  Minimal nonspecific foci of increased echogenicity along the wall of the gallbladder may reflect minimal adjacent fat; no suspicious mass is characterized.  There is no evidence of abnormal focal gallbladder wall thickening or pericholecystic fluid.  No stones are identified; there is no evidence for obstruction.  However, a positive ultrasonographic Murphy's sign is elicited.  Common Bile Duct:  0.4 cm in diameter; within normal limits in caliber.  Liver:  Diffusely increased hepatic echogenicity and coarsened echotexture, compatible with fatty infiltration; no focal lesions identified.  Limited Doppler evaluation demonstrates normal blood flow within the liver.  IVC:  Unremarkable in appearance.  Pancreas:  Although the pancreas is difficult to visualize in its entirety due to overlying bowel gas, no focal pancreatic abnormality is identified.  Spleen:  8.1 cm in length; within normal limits in size and echotexture.  Right kidney:  11.4 cm in length; normal in size, configuration and parenchymal echogenicity.  No evidence  of mass or hydronephrosis.  Left kidney:  9.9 cm in length; normal in size, configuration and parenchymal echogenicity.  No evidence of mass or hydronephrosis.  Abdominal Aorta:  Normal in caliber; no aneurysm identified.  IMPRESSION:  1.  Nonspecific positive ultrasonographic Murphy's sign noted; no evidence of cholelithiasis, cholecystitis or obstruction. 2.  Minimal nonspecific foci of increased echogenicity along the wall of the gallbladder may reflect minimal adjacent fat.  No suspicious mass or gallbladder wall thickening seen. 3.  Diffuse fatty infiltration within the liver.  Original Report Authenticated By: Tonia Ghent, M.D.     1. Biliary colic   2. Abdominal pain       MDM  Patient seen and evaluated. Patient in no acute distress.  Patient having improvement of pain after pain medication.      Angus Seller,  [place] 07/26/11 [phone]

## 2011-08-04 ENCOUNTER — Emergency Department (HOSPITAL_COMMUNITY): Payer: Self-pay

## 2011-08-04 ENCOUNTER — Emergency Department (HOSPITAL_COMMUNITY)
Admission: EM | Admit: 2011-08-04 | Discharge: 2011-08-04 | Disposition: A | Discharge status: Home / Self Care | Payer: Self-pay | Attending: Emergency Medicine | Admitting: Emergency Medicine

## 2011-08-04 ENCOUNTER — Encounter (HOSPITAL_COMMUNITY): Payer: Self-pay

## 2011-08-04 DIAGNOSIS — Z79899 Other long term (current) drug therapy: Secondary | ICD-10-CM | POA: Insufficient documentation

## 2011-08-04 DIAGNOSIS — R10811 Right upper quadrant abdominal tenderness: Secondary | ICD-10-CM | POA: Insufficient documentation

## 2011-08-04 DIAGNOSIS — R112 Nausea with vomiting, unspecified: Secondary | ICD-10-CM | POA: Insufficient documentation

## 2011-08-04 DIAGNOSIS — R109 Unspecified abdominal pain: Secondary | ICD-10-CM | POA: Insufficient documentation

## 2011-08-04 DIAGNOSIS — E039 Hypothyroidism, unspecified: Secondary | ICD-10-CM | POA: Insufficient documentation

## 2011-08-04 DIAGNOSIS — M549 Dorsalgia, unspecified: Secondary | ICD-10-CM | POA: Insufficient documentation

## 2011-08-04 LAB — URINALYSIS, ROUTINE W REFLEX MICROSCOPIC
Bilirubin Urine: NEGATIVE
Glucose, UA: NEGATIVE mg/dL
Hgb urine dipstick: NEGATIVE
Ketones, ur: NEGATIVE mg/dL
Leukocytes, UA: NEGATIVE
Nitrite: NEGATIVE
Protein, ur: NEGATIVE mg/dL
Specific Gravity, Urine: 1.02 (ref 1.005–1.030)
Urobilinogen, UA: 0.2 mg/dL (ref 0.0–1.0)
pH: 6 (ref 5.0–8.0)

## 2011-08-04 LAB — DIFFERENTIAL
Basophils Absolute: 0 10*3/uL (ref 0.0–0.1)
Basophils Relative: 0 % (ref 0–1)
Eosinophils Absolute: 0.2 10*3/uL (ref 0.0–0.7)
Eosinophils Relative: 2 % (ref 0–5)
Lymphocytes Relative: 33 % (ref 12–46)
Lymphs Abs: 2.3 10*3/uL (ref 0.7–4.0)
Monocytes Absolute: 0.6 10*3/uL (ref 0.1–1.0)
Monocytes Relative: 8 % (ref 3–12)
Neutro Abs: 3.9 10*3/uL (ref 1.7–7.7)
Neutrophils Relative %: 56 % (ref 43–77)

## 2011-08-04 LAB — CBC
HCT: 36.8 % (ref 36.0–46.0)
Hemoglobin: 12.5 g/dL (ref 12.0–15.0)
MCH: 30 pg (ref 26.0–34.0)
MCHC: 34 g/dL (ref 30.0–36.0)
MCV: 88.5 fL (ref 78.0–100.0)
Platelets: 282 10*3/uL (ref 150–400)
RBC: 4.16 MIL/uL (ref 3.87–5.11)
RDW: 12.4 % (ref 11.5–15.5)
WBC: 7 10*3/uL (ref 4.0–10.5)

## 2011-08-04 LAB — POCT PREGNANCY, URINE: Preg Test, Ur: NEGATIVE

## 2011-08-04 LAB — COMPREHENSIVE METABOLIC PANEL
ALT: 24 U/L (ref 0–35)
AST: 22 U/L (ref 0–37)
Albumin: 4 g/dL (ref 3.5–5.2)
Alkaline Phosphatase: 76 U/L (ref 39–117)
BUN: 10 mg/dL (ref 6–23)
CO2: 27 mEq/L (ref 19–32)
Calcium: 9.3 mg/dL (ref 8.4–10.5)
Chloride: 97 mEq/L (ref 96–112)
Creatinine, Ser: 0.49 mg/dL — ABNORMAL LOW (ref 0.50–1.10)
GFR calc Af Amer: >90 mL/min (ref >90)
GFR calc non Af Amer: >90 mL/min (ref >90)
Glucose, Bld: 89 mg/dL (ref 70–99)
Potassium: 3.9 mEq/L (ref 3.5–5.1)
Sodium: 134 mEq/L — ABNORMAL LOW (ref 135–145)
Total Bilirubin: 0.3 mg/dL (ref 0.3–1.2)
Total Protein: 7 g/dL (ref 6.0–8.3)

## 2011-08-04 LAB — LIPASE, BLOOD: Lipase: 23 U/L (ref 11–59)

## 2011-08-04 MED ORDER — HYDROCODONE-ACETAMINOPHEN 5-325 MG PO TABS: 2.0000 | ORAL_TABLET | ORAL | Status: AC | PRN

## 2011-08-04 MED ORDER — SODIUM CHLORIDE 0.9 % IV BOLUS (SEPSIS)
1000.0000 mL | Freq: Once | INTRAVENOUS | Status: AC
  Administered 2011-08-04: 1000 mL via INTRAVENOUS

## 2011-08-04 MED ORDER — HYDROMORPHONE HCL PF 1 MG/ML IJ SOLN: 1.0000 mg | Freq: Once | INTRAMUSCULAR | Status: AC

## 2011-08-04 MED ORDER — ONDANSETRON HCL 4 MG/2ML IJ SOLN
4.0000 mg | Freq: Once | INTRAMUSCULAR | Status: AC
  Administered 2011-08-04: 4 mg via INTRAVENOUS
  Filled 2011-08-04: qty 2

## 2011-08-04 MED ORDER — CYCLOBENZAPRINE HCL 10 MG PO TABS: 10.0000 mg | ORAL_TABLET | Freq: Two times a day (BID) | ORAL | Status: DC | PRN

## 2011-08-04 MED ORDER — HYDROMORPHONE HCL PF 2 MG/ML IJ SOLN
INTRAMUSCULAR | Status: AC
  Administered 2011-08-04: 1 mg
  Filled 2011-08-04: qty 1

## 2011-08-04 NOTE — ED Notes (Signed)
Pt to ED with severe abd pain that has been ongoing for a while. Pt states had an ultrasound last week and dx with gallstones. Pt states was told if the pain got worse to come back to ER for eval. Pt also with c/o n/v. Pt states last vomited yesterday. Pt in gown, pt awaits MD eval

## 2011-08-04 NOTE — ED Notes (Signed)
Patient reports that she was here on Saturday for abd. Pain and is here for further evaluation. Reports intermittent vomiting and was diagnosed with gallstones and here for recheck

## 2011-08-04 NOTE — ED Provider Notes (Signed)
History     CSN: [id number] Arrival date & time: 08/04/2011  7:00 PM   First MD Initiated Contact with Patient 08/04/11 2005      No chief complaint on file.   (Consider location/radiation/quality/duration/timing/severity/associated sxs/prior treatment) HPI Comments: Patient here for RUQ abdominal pain - she reports that she was seen here 1 week ago for worsening RUQ abdominal pain - she states that she has been seen by her PCP starting 1 year ago - she was told that her gallbladder is dysfunctional and placed on reglan for this.  She states that she continues to have pain though she reports she has returned to her PCP for the same.  She states that she has not been able to keep down medication and has constant vomiting - she denies fever or chills, reports no blood but is vomiting bile.  She reports clay colored stools.  She has not seen her PCP or a surgeon since she was seen this past week.    Patient is a 35 y.o. female presenting with abdominal pain. The history is provided by the patient. No language interpreter was used.  Abdominal Pain The primary symptoms of the illness include abdominal pain, nausea and vomiting. The primary symptoms of the illness do not include fever, fatigue, shortness of breath, diarrhea, hematemesis, hematochezia or dysuria. The current episode started more than 2 days ago. The onset of the illness was gradual. The problem has been gradually worsening.  The illness is associated with eating. The patient states that she believes she is currently not pregnant. The patient has not had a change in bowel habit. Additional symptoms associated with the illness include back pain. Symptoms associated with the illness do not include chills, anorexia, heartburn, constipation, urgency, hematuria or frequency. Significant associated medical issues include gallstones.    Past Medical History  Diagnosis Date  . Migraine, unspecified, without mention of intractable migraine  without mention of status migrainosus   . Vomiting alone   . Irritable bowel syndrome   . Esophageal reflux   . Diarrhea   . Depressive disorder, not elsewhere classified   . Panic disorder without agoraphobia   . Hypothyroid   . Depression     Past Surgical History  Procedure Date  . Wisdom tooth extraction   . Appendectomy     Family History  Problem Relation Age of Onset  . Diabetes Father   . Melanoma Father   . Colon polyps Father   . Colon cancer Neg Hx     History  Substance Use Topics  . Smoking status: Current Some Day Smoker -- 0.5 packs/day for 5 years    Types: Cigarettes  . Smokeless tobacco: Not on file  . Alcohol Use: Yes    OB History    Grav Para Term Preterm Abortions TAB SAB Ect Mult Living                  Review of Systems  Constitutional: Negative for fever, chills and fatigue.  Respiratory: Negative for shortness of breath.   Gastrointestinal: Positive for nausea, vomiting and abdominal pain. Negative for heartburn, diarrhea, constipation, hematochezia, anorexia and hematemesis.  Genitourinary: Negative for dysuria, urgency, frequency and hematuria.  Musculoskeletal: Positive for back pain.  All other systems reviewed and are negative.    Allergies  Dust mite extract and Novocain  Home Medications   Current Outpatient Rx  Name Route Sig Dispense Refill  . ACYCLOVIR 800 MG PO TABS Oral Take 800 mg by  mouth daily as needed. For fever blisters.    [name] BUTALBITAL-APAP-CAFF-COD 50-325-40-30 MG PO CAPS Oral Take 1-2 capsules by mouth every 4 (four) hours as needed. As needed for headache     . CLONAZEPAM 2 MG PO TABS Oral Take 2 mg by mouth 3 (three) times daily as needed. For anxiety. Takes a half to one tablet my by mouth as needed    . ESOMEPRAZOLE MAGNESIUM 40 MG PO CPDR Oral Take 40 mg by mouth daily as needed. For stomach upset.    [name] HYDROCODONE-ACETAMINOPHEN 5-325 MG PO TABS Oral Take 2 tablets by mouth every 4 (four) hours as needed  for pain. 20 tablet 0  . HYDROCODONE-ACETAMINOPHEN 5-500 MG PO TABS Oral Take 1 tablet by mouth at bedtime. 20 tablet 0  . HYDROXYZINE HCL 50 MG PO TABS Oral Take 50 mg by mouth 3 (three) times daily as needed.      [name] HYOSCYAMINE SULFATE ER 0.375 MG PO CP12 Oral Take 0.375 mg by mouth 2 (two) times daily as needed. For stomach pain. Takes one to two tablet by mouth .    [name] LEVOTHYROXINE SODIUM 50 MCG PO TABS Oral Take 50 mcg by mouth daily.     [name] METOCLOPRAMIDE HCL 10 MG PO TABS Oral Take 10 mg by mouth at bedtime and may repeat dose one time if needed. For cramping.    . ADULT GUMMY PO CHEW Oral Chew 1 tablet by mouth.      Carma Leaven M PLUS PO TABS Oral Take 1 tablet by mouth daily.      [name] PROMETHAZINE HCL 25 MG PO TABS Oral Take 25 mg by mouth every 6 (six) hours as needed. For nausea.    . SERTRALINE HCL 100 MG PO TABS Oral Take 200 mg by mouth daily.     . TRAMADOL HCL 50 MG PO TABS Oral Take 50 mg by mouth every 6 (six) hours as needed. Maximum dose= 8 tablets per day-FOR PAIN      BP 99/67  Pulse 77  Temp(Src) 98.4 F (36.9 C) (Oral)  Resp 15  SpO2 96%  LMP 07/12/2011  Physical Exam  Nursing note and vitals reviewed. Constitutional: She is oriented to person, place, and time. She appears well-developed and well-nourished.       tearful  HENT:  Head: Normocephalic and atraumatic.  Right Ear: External ear normal.  Left Ear: External ear normal.  Mouth/Throat: Oropharynx is clear and moist.  Eyes: Conjunctivae are normal. Pupils are equal, round, and reactive to light. No scleral icterus.  Neck: Normal range of motion. Neck supple.  Cardiovascular: Normal rate, regular rhythm and normal heart sounds.  Exam reveals no gallop and no friction rub.   No murmur heard. Pulmonary/Chest: Effort normal and breath sounds normal. She exhibits no tenderness.  Abdominal: Soft. Bowel sounds are normal. She exhibits no distension and no mass. There is tenderness in the right upper quadrant. There  is positive Murphy's sign. There is no rebound and no guarding.  Musculoskeletal: Normal range of motion.  Neurological: She is alert and oriented to person, place, and time.  Skin: Skin is warm and dry.  Psychiatric: She has a normal mood and affect. Her behavior is normal. Judgment and thought content normal.    ED Course  Procedures (including critical care time)  Labs Reviewed  URINALYSIS, ROUTINE W REFLEX MICROSCOPIC - Abnormal; Notable for the following:    APPearance CLOUDY (*)    All other components within normal  limits  COMPREHENSIVE METABOLIC PANEL - Abnormal; Notable for the following:    Sodium 134 (*)    Creatinine, Ser 0.49 (*)    All other components within normal limits  POCT PREGNANCY, URINE  CBC  DIFFERENTIAL  LIPASE, BLOOD  POCT PREGNANCY, URINE   No results found.   Biliary colic   MDM  Patient again without significant findings for gallbladder disease though there may be a dysmotility issue.  I have noted that the patient has multiple visits for pain complaints and while here is asking for refills of hydrocodone and flexeril - she reports that she has an appointment tomorrow with her PCP so I have told her that she will be getting 4 tablets of each medication to last her through the night - she is tearful with this and is requesting IV pain medication, which I have refused.        Izola Price [place], [place] 08/04/11 2158

## 2011-08-06 ENCOUNTER — Ambulatory Visit (INDEPENDENT_AMBULATORY_CARE_PROVIDER_SITE_OTHER): Payer: Medicaid Other | Admitting: General Surgery

## 2011-08-06 ENCOUNTER — Encounter (INDEPENDENT_AMBULATORY_CARE_PROVIDER_SITE_OTHER): Payer: Self-pay | Admitting: General Surgery

## 2011-08-06 VITALS — BP 94/62 | HR 60 | Temp 97.8°F | Resp 20 | Ht 67.0 in | Wt 160.0 lb

## 2011-08-06 DIAGNOSIS — R1031 Right lower quadrant pain: Secondary | ICD-10-CM

## 2011-08-06 NOTE — Progress Notes (Signed)
Patient ID: Lori Marshall, female   DOB: Nov 17, 1975, 35 y.o.   MRN: [id number]  Chief Complaint  Patient presents with  . New Evaluation    eval of GB     HPI Lori Marshall is a 35 y.o. female.   HPI  This patient presents for evaluation of one year history of upper abdominal pain and nausea. She has history of irritable bowel disease anxiety and depression and gastroesophageal reflux and has a history of watery diarrhea and black stools but began having these symptoms approximately one year ago. She states that she wakes up every morning due to nausea and has frequent episodes of vomiting. She described this as bilious and green appearing she also has right upper quadrant pain located under her ribs and she states it wraps around to her back she describes it as burning" barb wire". She has a history of reflux and regurgitation and takes omeprazole daily and this helped with her reflux but has not improved her nausea and her pain. She has been seen by Dr. Dickie La for treatment of her irritable bowel and has had an EGD and a colonoscopy. C. scope last year demonstrated some polyps in the EGD per patient report demonstrated some erosions. She complains of watery diarrhea which has been chronic for her. She does not describe any change in food and has asked again to wait over this time period. She had an ultrasound of the gallbladder which has been negative x2 and no gallstones have been found.  Past Medical History  Diagnosis Date  . Migraine, unspecified, without mention of intractable migraine without mention of status migrainosus   . Vomiting alone   . Irritable bowel syndrome   . Esophageal reflux   . Diarrhea   . Depressive disorder, not elsewhere classified   . Panic disorder without agoraphobia   . Hypothyroid   . Depression     Past Surgical History  Procedure Date  . Wisdom tooth extraction   . Appendectomy     Family History  Problem Relation Age of Onset  . Diabetes  Father   . Melanoma Father   . Colon polyps Father   . Colon cancer Neg Hx     Social History History  Substance Use Topics  . Smoking status: Current Some Day Smoker -- 0.5 packs/day for 5 years    Types: Cigarettes  . Smokeless tobacco: Never Used  . Alcohol Use: Yes    Allergies  Allergen Reactions  . Dust Mite Extract Swelling  . Novocain Palpitations    Current Outpatient Prescriptions  Medication Sig Dispense Refill  . acyclovir (ZOVIRAX) 800 MG tablet Take 800 mg by mouth daily as needed. For fever blisters.      . butalbital-acetaminophen-caffeine (FIORICET WITH CODEINE) 50-325-40-30 MG per capsule Take 1-2 capsules by mouth every 4 (four) hours as needed. As needed for headache       . clonazePAM (KLONOPIN) 2 MG tablet Take 2 mg by mouth 3 (three) times daily as needed. For anxiety. Takes a half to one tablet my by mouth as needed      . cyclobenzaprine (FLEXERIL) 10 MG tablet Take 1 tablet (10 mg total) by mouth 2 (two) times daily as needed for muscle spasms.  4 tablet  0  . esomeprazole (NEXIUM) 40 MG capsule Take 40 mg by mouth daily as needed. For stomach upset.      [name] HYDROcodone-acetaminophen (NORCO) 5-325 MG per tablet Take 2 tablets by mouth every 4 (  four) hours as needed for pain.  4 tablet  0  . hyoscyamine (LEVSINEX) 0.375 MG 12 hr capsule Take 0.375 mg by mouth 2 (two) times daily as needed. For stomach pain. Takes one to two tablet by mouth .      . levothyroxine (SYNTHROID, LEVOTHROID) 50 MCG tablet Take 50 mcg by mouth daily.       . metoCLOPramide (REGLAN) 10 MG tablet Take 10 mg by mouth at bedtime and may repeat dose one time if needed. For cramping.      . Multiple Vitamins-Minerals (ADULT GUMMY) CHEW Chew 1 tablet by mouth.        . Multiple Vitamins-Minerals (MULTIVITAMINS THER. W/MINERALS) TABS Take 1 tablet by mouth daily.        . sertraline (ZOLOFT) 100 MG tablet Take 200 mg by mouth daily.       . traMADol (ULTRAM) 50 MG tablet Take 50 mg by  mouth every 6 (six) hours as needed. Maximum dose= 8 tablets per day-FOR PAIN      . HYDROcodone-acetaminophen (NORCO) 5-325 MG per tablet Take 2 tablets by mouth every 4 (four) hours as needed for pain.  20 tablet  0    Review of Systems Review of Systems  All other systems reviewed and are negative.  Except as above   Blood pressure 94/62, pulse 60, temperature 97.8 F (36.6 C), temperature source Temporal, resp. rate 20, height 5\' 7"  (1.702 m), weight 160 lb (72.576 kg), last menstrual period 07/12/2011.  Physical Exam Physical Exam  Vitals reviewed. Constitutional: She is oriented to person, place, and time. She appears well-developed and well-nourished. No distress.  HENT:  Head: Normocephalic and atraumatic.  Mouth/Throat: No oropharyngeal exudate.  Eyes: Conjunctivae are normal. Pupils are equal, round, and reactive to light. Right eye exhibits no discharge. Left eye exhibits no discharge. No scleral icterus.  Neck: Normal range of motion. No tracheal deviation present.  Cardiovascular: Normal rate, regular rhythm and normal heart sounds.   Pulmonary/Chest: Effort normal and breath sounds normal. No stridor. No respiratory distress. She has no wheezes.  Abdominal: Soft. Bowel sounds are normal. She exhibits no distension and no mass. There is tenderness. There is no rebound and no guarding.       Minimal discomfort in RUQ with deep palpation   Musculoskeletal: Normal range of motion. She exhibits no edema.  Neurological: She is alert and oriented to person, place, and time.  Skin: Skin is warm and dry. No rash noted. She is not diaphoretic. No erythema. No pallor.  Psychiatric: She has a normal mood and affect. Her behavior is normal. Judgment and thought content normal.    Data Reviewed   Assessment    Abdominal pain and nausea of uncertain etiology This may be related to her gallbladder and potential biliary dyskinesia however we have no objective evidence indicating  gallbladder pathology. She has multiple constellation of symptoms including diarrhea reflux gastric erosions the nausea and abdominal pain without any definite cause. This may also be due to gastritis and bile reflux or peptic ulcer disease. This also may be due to her irritable bowel syndrome.    Plan    At this time, I would not recommend any surgical intervention. I have recommended a HIDA scan to evaluate gallbladder function and elect to obtain the records from Dr. Marian Sorrow office. If the HIDA scan shows evidence of biliary dyskinesia and I would consider cholecystectomy however, I would certainly not feel very confident that this would resolve  her symptoms. I explained this to the patient and her mother and explained that even with cholecystectomy her symptoms could be exacerbated or diarrhea may be made worse. I recommended that she be reevaluated by Dr. Dickie La in consider repeat EGD to ensure that her erosions have healed. The patient's mother asked about b.i.d. PPI and I think that this is reasonable to try b.i.d. PPIs to see if there is any improvement in the symptoms. She will follow up after her HIDA scan for repeat evaluation.       Lodema Pilot DAVID 08/06/2011, 1:15 PM

## 2011-08-09 NOTE — ED Provider Notes (Signed)
Medical screening examination/treatment/procedure(s) were performed by non-physician practitioner and as supervising physician I was immediately available for consultation/collaboration.   Nikko Goldwire E Merina Behrendt, MD 08/09/11 0903 

## 2011-08-13 ENCOUNTER — Ambulatory Visit (INDEPENDENT_AMBULATORY_CARE_PROVIDER_SITE_OTHER): Payer: Self-pay | Admitting: General Surgery

## 2011-08-14 ENCOUNTER — Encounter (HOSPITAL_COMMUNITY)
Admission: RE | Admit: 2011-08-14 | Discharge: 2011-08-14 | Disposition: A | Discharge status: Home / Self Care | Payer: Medicaid Other | Source: Ambulatory Visit | Attending: General Surgery | Admitting: General Surgery

## 2011-08-14 DIAGNOSIS — R1011 Right upper quadrant pain: Secondary | ICD-10-CM | POA: Insufficient documentation

## 2011-08-14 DIAGNOSIS — R1031 Right lower quadrant pain: Secondary | ICD-10-CM

## 2011-08-14 DIAGNOSIS — R110 Nausea: Secondary | ICD-10-CM | POA: Insufficient documentation

## 2011-08-14 MED ORDER — TECHNETIUM TC 99M MEBROFENIN IV KIT
5.0000 | PACK | Freq: Once | INTRAVENOUS | Status: AC | PRN
  Administered 2011-08-14: 5 via INTRAVENOUS

## 2011-08-14 MED ORDER — SINCALIDE 5 MCG IJ SOLR
INTRAMUSCULAR | Status: AC
  Administered 2011-08-14: 1.36 ug via INTRAVENOUS
  Filled 2011-08-14: qty 5

## 2011-08-18 ENCOUNTER — Telehealth (INDEPENDENT_AMBULATORY_CARE_PROVIDER_SITE_OTHER): Payer: Self-pay | Admitting: General Surgery

## 2011-08-18 ENCOUNTER — Ambulatory Visit (HOSPITAL_COMMUNITY)
Admission: EM | Admit: 2011-08-18 | Discharge: 2011-08-20 | Disposition: A | Discharge status: Home / Self Care | Payer: Medicaid Other | Attending: General Surgery | Admitting: General Surgery

## 2011-08-18 ENCOUNTER — Encounter (HOSPITAL_COMMUNITY): Payer: Self-pay | Admitting: Emergency Medicine

## 2011-08-18 ENCOUNTER — Telehealth (INDEPENDENT_AMBULATORY_CARE_PROVIDER_SITE_OTHER): Payer: Self-pay

## 2011-08-18 DIAGNOSIS — F410 Panic disorder [episodic paroxysmal anxiety] without agoraphobia: Secondary | ICD-10-CM | POA: Insufficient documentation

## 2011-08-18 DIAGNOSIS — K219 Gastro-esophageal reflux disease without esophagitis: Secondary | ICD-10-CM | POA: Insufficient documentation

## 2011-08-18 DIAGNOSIS — F329 Major depressive disorder, single episode, unspecified: Secondary | ICD-10-CM | POA: Insufficient documentation

## 2011-08-18 DIAGNOSIS — K811 Chronic cholecystitis: Secondary | ICD-10-CM | POA: Insufficient documentation

## 2011-08-18 DIAGNOSIS — K812 Acute cholecystitis with chronic cholecystitis: Secondary | ICD-10-CM | POA: Diagnosis present

## 2011-08-18 DIAGNOSIS — F411 Generalized anxiety disorder: Secondary | ICD-10-CM | POA: Insufficient documentation

## 2011-08-18 DIAGNOSIS — R1110 Vomiting, unspecified: Secondary | ICD-10-CM | POA: Insufficient documentation

## 2011-08-18 DIAGNOSIS — R51 Headache: Secondary | ICD-10-CM | POA: Insufficient documentation

## 2011-08-18 DIAGNOSIS — Z79899 Other long term (current) drug therapy: Secondary | ICD-10-CM | POA: Insufficient documentation

## 2011-08-18 DIAGNOSIS — R197 Diarrhea, unspecified: Secondary | ICD-10-CM | POA: Insufficient documentation

## 2011-08-18 DIAGNOSIS — F3289 Other specified depressive episodes: Secondary | ICD-10-CM | POA: Insufficient documentation

## 2011-08-18 DIAGNOSIS — R1011 Right upper quadrant pain: Secondary | ICD-10-CM | POA: Insufficient documentation

## 2011-08-18 DIAGNOSIS — Z23 Encounter for immunization: Secondary | ICD-10-CM | POA: Insufficient documentation

## 2011-08-18 DIAGNOSIS — R7402 Elevation of levels of lactic acid dehydrogenase (LDH): Secondary | ICD-10-CM | POA: Insufficient documentation

## 2011-08-18 DIAGNOSIS — K828 Other specified diseases of gallbladder: Secondary | ICD-10-CM | POA: Insufficient documentation

## 2011-08-18 DIAGNOSIS — K589 Irritable bowel syndrome without diarrhea: Secondary | ICD-10-CM | POA: Insufficient documentation

## 2011-08-18 DIAGNOSIS — E039 Hypothyroidism, unspecified: Secondary | ICD-10-CM | POA: Insufficient documentation

## 2011-08-18 MED ORDER — MORPHINE SULFATE 4 MG/ML IJ SOLN: 4.0000 mg | Freq: Once | INTRAMUSCULAR | Status: AC

## 2011-08-18 MED ORDER — SODIUM CHLORIDE 0.9 % IV BOLUS (SEPSIS)
1000.0000 mL | Freq: Once | INTRAVENOUS | Status: AC
  Administered 2011-08-19: 1000 mL via INTRAVENOUS

## 2011-08-18 MED ORDER — DIPHENHYDRAMINE HCL 50 MG/ML IJ SOLN
25.0000 mg | Freq: Once | INTRAMUSCULAR | Status: AC
  Administered 2011-08-19: 25 mg via INTRAVENOUS
  Filled 2011-08-18: qty 1

## 2011-08-18 NOTE — ED Notes (Signed)
Pt reports RUQ pain that radiates to back and n/v that began this a.m. - pt states she has had a HIDA scan that resulted w/ 9% biliary ejection and plan for surgery w/ [facility].

## 2011-08-18 NOTE — Telephone Encounter (Signed)
Pt called in stating she had a HIDA scan on Thursday. She called her regular doctor on 08/17/11 because she was in so much pain, and that he pulled up her test results telling her her gallbladder was functioning at only 7% and that the information would be on Dr. Delice Lesch desk and surgery would have to be scheduled. I advised her that Dr. Biagio Quint was at the hospital today and that Dr. Biagio Quint may be in contact with her regular doctor to discuss the information and the options. I asked the patient if she went to the ER yesterday because of the pain and she said no. She did state her regular doctor did issue medication for pain. Advised her the information would be sent to Dr. Biagio Quint and his nurse, one of whom would contact her regarding what is next.

## 2011-08-18 NOTE — ED Provider Notes (Signed)
History     CSN: [id number] Arrival date & time: 08/18/2011  8:44 PM   First MD Initiated Contact with Patient 08/18/11 2336      Chief Complaint  Patient presents with  . Abdominal Pain     HPI  History provided by the patient. Patient presents with complaints of upper abdominal pain had been increasingly getting worse over the past several months. Patient reports having outpatient hiatus scan performed late last week and was notified by her primary doctor that her gallbladder was performing poorly she will most likely need surgery for removal. She has followup scheduled with [facility] states the pain was too severe all day today. She called the on-call surgery number was recommended to come to the emergency room to be CT evaluated by Dr. gross. Patient reports having episodes of nausea and vomiting all day. She denies fever chills or sweats.   Past Medical History  Diagnosis Date  . Migraine, unspecified, without mention of intractable migraine without mention of status migrainosus   . Vomiting alone   . Irritable bowel syndrome   . Esophageal reflux   . Diarrhea   . Depressive disorder, not elsewhere classified   . Panic disorder without agoraphobia   . Hypothyroid   . Depression     Past Surgical History  Procedure Date  . Wisdom tooth extraction   . Appendectomy     Family History  Problem Relation Age of Onset  . Diabetes Father   . Melanoma Father   . Colon polyps Father   . Colon cancer Neg Hx     History  Substance Use Topics  . Smoking status: Current Some Day Smoker -- 0.5 packs/day for 5 years    Types: Cigarettes  . Smokeless tobacco: Never Used  . Alcohol Use: No    OB History    Grav Para Term Preterm Abortions TAB SAB Ect Mult Living                  Review of Systems  Constitutional: Negative for fever and chills.  Gastrointestinal: Positive for nausea, vomiting and abdominal pain. Negative for diarrhea and constipation.    All other systems reviewed and are negative.    Allergies  Dust mite extract and Novocain  Home Medications   Current Outpatient Rx  Name Route Sig Dispense Refill  . ACYCLOVIR 800 MG PO TABS Oral Take 800 mg by mouth daily as needed. For fever blisters.    [name] BUTALBITAL-APAP-CAFF-COD 50-325-40-30 MG PO CAPS Oral Take 1-2 capsules by mouth every 4 (four) hours as needed. As needed for headache     . CLONAZEPAM 2 MG PO TABS Oral Take 2 mg by mouth 3 (three) times daily as needed. For anxiety. Takes a half to one tablet my by mouth as needed    . ESOMEPRAZOLE MAGNESIUM 40 MG PO CPDR Oral Take 40 mg by mouth daily as needed. For stomach upset.    [name] HYOSCYAMINE SULFATE ER 0.375 MG PO CP12 Oral Take 0.375 mg by mouth 2 (two) times daily as needed. For stomach pain. Takes one to two tablet by mouth .    [name] LEVOTHYROXINE SODIUM 50 MCG PO TABS Oral Take 50 mcg by mouth daily.     [name] METOCLOPRAMIDE HCL 10 MG PO TABS Oral Take 10 mg by mouth at bedtime and may repeat dose one time if needed. For cramping.    . ADULT GUMMY PO CHEW Oral Chew 1 tablet by mouth.      [name]  THERA M PLUS PO TABS Oral Take 1 tablet by mouth daily.      . SERTRALINE HCL 100 MG PO TABS Oral Take 200 mg by mouth daily.     . TRAMADOL HCL 50 MG PO TABS Oral Take 50 mg by mouth every 6 (six) hours as needed. Maximum dose= 8 tablets per day-FOR PAIN      BP 124/85  Pulse 86  Temp(Src) 98.5 F (36.9 C) (Oral)  Resp 20  SpO2 97%  LMP 08/18/2011  Physical Exam  Nursing note and vitals reviewed. Constitutional: She is oriented to person, place, and time. She appears well-developed and well-nourished. No distress.  HENT:  Head: Normocephalic.  Cardiovascular: Normal rate, regular rhythm and normal heart sounds.   Pulmonary/Chest: Effort normal and breath sounds normal.  Abdominal: Soft. There is tenderness in the right upper quadrant, epigastric area and left upper quadrant. There is no guarding, no CVA tenderness and no  tenderness at McBurney's point.  Musculoskeletal: She exhibits no edema and no tenderness.  Neurological: She is alert and oriented to person, place, and time.  Skin: Skin is warm.  Psychiatric: Her behavior is normal.    ED Course  Procedures (including critical care time)   Labs Reviewed  CBC  DIFFERENTIAL  COMPREHENSIVE METABOLIC PANEL  LIPASE, BLOOD   Results for orders placed during the hospital encounter of 08/18/11  CBC      Component Value Range   WBC 5.2  4.0 - 10.5 (K/uL)   RBC 3.71 (*) 3.87 - 5.11 (MIL/uL)   Hemoglobin 11.2 (*) 12.0 - 15.0 (g/dL)   HCT 40.9 (*) 81.1 - 46.0 (%)   MCV 88.9  78.0 - 100.0 (fL)   MCH 30.2  26.0 - 34.0 (pg)   MCHC 33.9  30.0 - 36.0 (g/dL)   RDW 91.4  78.2 - 95.6 (%)   Platelets 253  150 - 400 (K/uL)  DIFFERENTIAL      Component Value Range   Neutrophils Relative 43  43 - 77 (%)   Neutro Abs 2.3  1.7 - 7.7 (K/uL)   Lymphocytes Relative 47 (*) 12 - 46 (%)   Lymphs Abs 2.5  0.7 - 4.0 (K/uL)   Monocytes Relative 7  3 - 12 (%)   Monocytes Absolute 0.4  0.1 - 1.0 (K/uL)   Eosinophils Relative 3  0 - 5 (%)   Eosinophils Absolute 0.1  0.0 - 0.7 (K/uL)   Basophils Relative 0  0 - 1 (%)   Basophils Absolute 0.0  0.0 - 0.1 (K/uL)  COMPREHENSIVE METABOLIC PANEL      Component Value Range   Sodium 136  135 - 145 (mEq/L)   Potassium 3.7  3.5 - 5.1 (mEq/L)   Chloride 103  96 - 112 (mEq/L)   CO2 25  19 - 32 (mEq/L)   Glucose, Bld 105 (*) 70 - 99 (mg/dL)   BUN 9  6 - 23 (mg/dL)   Creatinine, Ser 2.13  0.50 - 1.10 (mg/dL)   Calcium 8.8  8.4 - 08.6 (mg/dL)   Total Protein 6.4  6.0 - 8.3 (g/dL)   Albumin 3.6  3.5 - 5.2 (g/dL)   AST 19  0 - 37 (U/L)   ALT 18  0 - 35 (U/L)   Alkaline Phosphatase 58  39 - 117 (U/L)   Total Bilirubin 0.2 (*) 0.3 - 1.2 (mg/dL)   GFR calc non Af Amer >90  >90 (mL/min)   GFR calc Af Amer >90  >  90 (mL/min)  LIPASE, BLOOD      Component Value Range   Lipase 17  11 - 59 (U/L)       1. Abdominal pain   2.  Biliary dyskinesia       MDM  11:30 PM patient seen and evaluated. Patient in no acute distress. Patient is uncomfortable.  Spoke with Dr. gross with general surgery he is in the middle of the procedure will, does evaluate the patient later this morning.  Dr. gross has seen patient and will admit.      Angus Seller, [place] 08/19/11 [phone]

## 2011-08-18 NOTE — Telephone Encounter (Signed)
Patient called regarding her HIDA scan results, patient currently having a lot of abdominal pain and states her pain medication is not relieving or controlling the pain.  Spoke with Dr. Biagio Quint and he will discuss treatment plan with patient on Wed 08/20/11, patient asked about what to do overnight with her pain and symptoms, I advised the patient that if she feel's her pain is unbearable or she cannot wait until Wednesday to call our office and speak with triage and if after hours she can speak with a surgeon on-call or report to the nearest Emergency Room.

## 2011-08-18 NOTE — ED Notes (Signed)
Pt states she had a hida scan on Thursday and got results on Sunday because she called her dr and was told her gallbladder was only functioning 8%  Pt states she is to follow up with [facility]  Pt spoke with Dr Michaell Cowing prior to arrival and was told to come here and he would see her here  Pt states her pain has gotten worse Sunday morning

## 2011-08-19 ENCOUNTER — Emergency Department (HOSPITAL_COMMUNITY): Payer: Medicaid Other

## 2011-08-19 ENCOUNTER — Other Ambulatory Visit (INDEPENDENT_AMBULATORY_CARE_PROVIDER_SITE_OTHER): Payer: Self-pay | Admitting: Surgery

## 2011-08-19 ENCOUNTER — Encounter (HOSPITAL_COMMUNITY): Payer: Self-pay | Admitting: Anesthesiology

## 2011-08-19 ENCOUNTER — Encounter (HOSPITAL_COMMUNITY): Payer: Self-pay

## 2011-08-19 ENCOUNTER — Encounter (HOSPITAL_COMMUNITY)
Admission: EM | Disposition: A | Discharge status: Home / Self Care | Payer: Self-pay | Source: Home / Self Care | Attending: Emergency Medicine

## 2011-08-19 ENCOUNTER — Emergency Department (HOSPITAL_COMMUNITY): Payer: Medicaid Other | Admitting: Anesthesiology

## 2011-08-19 DIAGNOSIS — K811 Chronic cholecystitis: Secondary | ICD-10-CM

## 2011-08-19 DIAGNOSIS — K812 Acute cholecystitis with chronic cholecystitis: Secondary | ICD-10-CM | POA: Diagnosis present

## 2011-08-19 HISTORY — PX: CHOLECYSTECTOMY: SHX55

## 2011-08-19 LAB — DIFFERENTIAL
Basophils Absolute: 0 10*3/uL (ref 0.0–0.1)
Basophils Relative: 0 % (ref 0–1)
Eosinophils Absolute: 0.1 10*3/uL (ref 0.0–0.7)
Eosinophils Relative: 3 % (ref 0–5)
Lymphocytes Relative: 47 % — ABNORMAL HIGH (ref 12–46)
Lymphs Abs: 2.5 10*3/uL (ref 0.7–4.0)
Monocytes Absolute: 0.4 10*3/uL (ref 0.1–1.0)
Monocytes Relative: 7 % (ref 3–12)
Neutro Abs: 2.3 10*3/uL (ref 1.7–7.7)
Neutrophils Relative %: 43 % (ref 43–77)

## 2011-08-19 LAB — COMPREHENSIVE METABOLIC PANEL
ALT: 18 U/L (ref 0–35)
AST: 19 U/L (ref 0–37)
Albumin: 3.6 g/dL (ref 3.5–5.2)
Alkaline Phosphatase: 58 U/L (ref 39–117)
BUN: 9 mg/dL (ref 6–23)
CO2: 25 mEq/L (ref 19–32)
Calcium: 8.8 mg/dL (ref 8.4–10.5)
Chloride: 103 mEq/L (ref 96–112)
Creatinine, Ser: 0.66 mg/dL (ref 0.50–1.10)
GFR calc Af Amer: >90 mL/min (ref >90)
GFR calc non Af Amer: >90 mL/min (ref >90)
Glucose, Bld: 105 mg/dL — ABNORMAL HIGH (ref 70–99)
Potassium: 3.7 mEq/L (ref 3.5–5.1)
Sodium: 136 mEq/L (ref 135–145)
Total Bilirubin: 0.2 mg/dL — ABNORMAL LOW (ref 0.3–1.2)
Total Protein: 6.4 g/dL (ref 6.0–8.3)

## 2011-08-19 LAB — CBC
HCT: 33 % — ABNORMAL LOW (ref 36.0–46.0)
Hemoglobin: 11.2 g/dL — ABNORMAL LOW (ref 12.0–15.0)
MCH: 30.2 pg (ref 26.0–34.0)
MCHC: 33.9 g/dL (ref 30.0–36.0)
MCV: 88.9 fL (ref 78.0–100.0)
Platelets: 253 10*3/uL (ref 150–400)
RBC: 3.71 MIL/uL — ABNORMAL LOW (ref 3.87–5.11)
RDW: 12.7 % (ref 11.5–15.5)
WBC: 5.2 10*3/uL (ref 4.0–10.5)

## 2011-08-19 LAB — LIPASE, BLOOD: Lipase: 17 U/L (ref 11–59)

## 2011-08-19 SURGERY — LAPAROSCOPIC CHOLECYSTECTOMY WITH INTRAOPERATIVE CHOLANGIOGRAM
Anesthesia: General | Site: Abdomen | Wound class: Clean Contaminated

## 2011-08-19 MED ORDER — LACTATED RINGERS IV SOLN
INTRAVENOUS | Status: DC
  Administered 2011-08-19: 1000 mL via INTRAVENOUS

## 2011-08-19 MED ORDER — ONDANSETRON HCL 4 MG/2ML IJ SOLN
INTRAMUSCULAR | Status: AC
  Filled 2011-08-19: qty 2

## 2011-08-19 MED ORDER — PANTOPRAZOLE SODIUM 40 MG PO TBEC
40.0000 mg | DELAYED_RELEASE_TABLET | Freq: Two times a day (BID) | ORAL | Status: DC
  Administered 2011-08-19 – 2011-08-20 (×4): 40 mg via ORAL
  Filled 2011-08-19 (×6): qty 1

## 2011-08-19 MED ORDER — BUPIVACAINE-EPINEPHRINE (PF) 0.5% -1:200000 IJ SOLN
INTRAMUSCULAR | Status: AC
  Filled 2011-08-19: qty 10

## 2011-08-19 MED ORDER — HYOSCYAMINE SULFATE CR 0.375 MG PO CP12: 0.3750 mg | ORAL_CAPSULE | Freq: Two times a day (BID) | ORAL | Status: DC | PRN

## 2011-08-19 MED ORDER — 0.9 % SODIUM CHLORIDE (POUR BTL) OPTIME
TOPICAL | Status: DC | PRN
  Administered 2011-08-19: 1000 mL

## 2011-08-19 MED ORDER — ACETAMINOPHEN 325 MG PO TABS: 325.0000 mg | ORAL_TABLET | Freq: Four times a day (QID) | ORAL | Status: DC | PRN

## 2011-08-19 MED ORDER — ONDANSETRON HCL 4 MG/2ML IJ SOLN
4.0000 mg | Freq: Four times a day (QID) | INTRAMUSCULAR | Status: DC
  Administered 2011-08-19: 4 mg via INTRAVENOUS
  Filled 2011-08-19: qty 2

## 2011-08-19 MED ORDER — CEFAZOLIN SODIUM 1-5 GM-% IV SOLN
INTRAVENOUS | Status: AC
  Filled 2011-08-19: qty 50

## 2011-08-19 MED ORDER — PROPOFOL 10 MG/ML IV BOLUS
INTRAVENOUS | Status: DC | PRN
  Administered 2011-08-19: 130 mg via INTRAVENOUS

## 2011-08-19 MED ORDER — LACTATED RINGERS IV SOLN
INTRAVENOUS | Status: DC
  Administered 2011-08-19: 1000 mL via INTRAVENOUS
  Administered 2011-08-20: 03:00:00 via INTRAVENOUS

## 2011-08-19 MED ORDER — METOPROLOL TARTRATE 1 MG/ML IV SOLN
5.0000 mg | Freq: Four times a day (QID) | INTRAVENOUS | Status: DC | PRN
  Filled 2011-08-19: qty 5

## 2011-08-19 MED ORDER — OXYCODONE HCL 5 MG PO TABS: 5.0000 mg | ORAL_TABLET | ORAL | Status: DC | PRN

## 2011-08-19 MED ORDER — HYDROMORPHONE HCL PF 1 MG/ML IJ SOLN
1.0000 mg | Freq: Once | INTRAMUSCULAR | Status: AC
  Administered 2011-08-19: 1 mg via INTRAVENOUS
  Filled 2011-08-19: qty 1

## 2011-08-19 MED ORDER — FLORA-Q PO CAPS
1.0000 | ORAL_CAPSULE | Freq: Every day | ORAL | Status: DC
  Filled 2011-08-19: qty 1

## 2011-08-19 MED ORDER — LACTATED RINGERS IV SOLN
INTRAVENOUS | Status: DC | PRN
  Administered 2011-08-19: 1000 mL via INTRAVENOUS

## 2011-08-19 MED ORDER — LIP MEDEX EX OINT
1.0000 "application " | TOPICAL_OINTMENT | Freq: Two times a day (BID) | CUTANEOUS | Status: DC
  Filled 2011-08-19: qty 7

## 2011-08-19 MED ORDER — DIPHENHYDRAMINE HCL 50 MG/ML IJ SOLN
25.0000 mg | Freq: Once | INTRAMUSCULAR | Status: AC
  Administered 2011-08-19: 25 mg via INTRAVENOUS
  Filled 2011-08-19: qty 1

## 2011-08-19 MED ORDER — FENTANYL CITRATE 0.05 MG/ML IJ SOLN
INTRAMUSCULAR | Status: DC | PRN
  Administered 2011-08-19: 50 ug via INTRAVENOUS

## 2011-08-19 MED ORDER — ACETAMINOPHEN 325 MG PO TABS: 650.0000 mg | ORAL_TABLET | ORAL | Status: DC | PRN

## 2011-08-19 MED ORDER — GUAIFENESIN-DM 100-10 MG/5ML PO SYRP
15.0000 mL | ORAL_SOLUTION | ORAL | Status: DC | PRN
  Filled 2011-08-19 (×2): qty 10

## 2011-08-19 MED ORDER — ONDANSETRON HCL 4 MG/2ML IJ SOLN
4.0000 mg | Freq: Four times a day (QID) | INTRAMUSCULAR | Status: DC | PRN
  Administered 2011-08-19: 4 mg via INTRAVENOUS

## 2011-08-19 MED ORDER — DEXAMETHASONE SODIUM PHOSPHATE 10 MG/ML IJ SOLN
INTRAMUSCULAR | Status: DC | PRN
  Administered 2011-08-19: 10 mg via INTRAVENOUS

## 2011-08-19 MED ORDER — DEXTROSE IN LACTATED RINGERS 5 % IV SOLN
INTRAVENOUS | Status: DC
  Administered 2011-08-19: 06:00:00 via INTRAVENOUS

## 2011-08-19 MED ORDER — PROMETHAZINE HCL 25 MG/ML IJ SOLN
12.5000 mg | Freq: Four times a day (QID) | INTRAMUSCULAR | Status: DC | PRN
  Administered 2011-08-19 – 2011-08-20 (×4): 12.5 mg via INTRAVENOUS
  Filled 2011-08-19 (×4): qty 1

## 2011-08-19 MED ORDER — PROMETHAZINE HCL 25 MG/ML IJ SOLN: 6.2500 mg | INTRAMUSCULAR | Status: DC | PRN

## 2011-08-19 MED ORDER — SERTRALINE HCL 100 MG PO TABS
200.0000 mg | ORAL_TABLET | Freq: Every day | ORAL | Status: DC
  Administered 2011-08-20: 200 mg via ORAL
  Filled 2011-08-19 (×2): qty 2

## 2011-08-19 MED ORDER — SUCCINYLCHOLINE CHLORIDE 20 MG/ML IJ SOLN
INTRAMUSCULAR | Status: DC | PRN
  Administered 2011-08-19: 100 mg via INTRAVENOUS

## 2011-08-19 MED ORDER — DIPHENHYDRAMINE HCL 50 MG/ML IJ SOLN
12.5000 mg | Freq: Four times a day (QID) | INTRAMUSCULAR | Status: DC | PRN
  Administered 2011-08-19: 25 mg via INTRAVENOUS
  Filled 2011-08-19: qty 1

## 2011-08-19 MED ORDER — PROMETHAZINE HCL 25 MG/ML IJ SOLN: 12.5000 mg | Freq: Four times a day (QID) | INTRAMUSCULAR | Status: DC | PRN

## 2011-08-19 MED ORDER — HYDROMORPHONE HCL PF 2 MG/ML IJ SOLN
0.5000 mg | INTRAMUSCULAR | Status: DC | PRN
  Administered 2011-08-19: 1 mg via INTRAVENOUS
  Administered 2011-08-19: 2 mg via INTRAVENOUS
  Filled 2011-08-19 (×2): qty 1

## 2011-08-19 MED ORDER — LIDOCAINE HCL (CARDIAC) 20 MG/ML IV SOLN
INTRAVENOUS | Status: DC | PRN
  Administered 2011-08-19: 50 mg via INTRAVENOUS

## 2011-08-19 MED ORDER — MAGIC MOUTHWASH: 15.0000 mL | Freq: Four times a day (QID) | ORAL | Status: DC | PRN

## 2011-08-19 MED ORDER — MENTHOL 3 MG MT LOZG
1.0000 | LOZENGE | OROMUCOSAL | Status: DC | PRN
  Administered 2011-08-20: 3 mg via ORAL
  Filled 2011-08-19: qty 9

## 2011-08-19 MED ORDER — FENTANYL CITRATE 0.05 MG/ML IJ SOLN
INTRAMUSCULAR | Status: AC
  Filled 2011-08-19: qty 2

## 2011-08-19 MED ORDER — BISACODYL 10 MG RE SUPP: 10.0000 mg | Freq: Two times a day (BID) | RECTAL | Status: DC | PRN

## 2011-08-19 MED ORDER — CEFAZOLIN SODIUM 1-5 GM-% IV SOLN
INTRAVENOUS | Status: DC | PRN
  Administered 2011-08-19: 1 g via INTRAVENOUS

## 2011-08-19 MED ORDER — METOCLOPRAMIDE HCL 10 MG PO TABS: 10.0000 mg | ORAL_TABLET | Freq: Every evening | ORAL | Status: DC | PRN

## 2011-08-19 MED ORDER — HYDROCODONE-ACETAMINOPHEN 5-325 MG PO TABS
1.0000 | ORAL_TABLET | ORAL | Status: DC | PRN
  Administered 2011-08-19: 2 via ORAL
  Filled 2011-08-19: qty 2

## 2011-08-19 MED ORDER — PANTOPRAZOLE SODIUM 40 MG PO TBEC: 40.0000 mg | DELAYED_RELEASE_TABLET | Freq: Every day | ORAL | Status: DC

## 2011-08-19 MED ORDER — LEVOTHYROXINE SODIUM 50 MCG PO TABS
50.0000 ug | ORAL_TABLET | Freq: Every day | ORAL | Status: DC
  Administered 2011-08-20: 50 ug via ORAL
  Filled 2011-08-19 (×3): qty 1

## 2011-08-19 MED ORDER — LACTATED RINGERS IV SOLN
INTRAVENOUS | Status: DC | PRN
  Administered 2011-08-19 (×2): via INTRAVENOUS

## 2011-08-19 MED ORDER — BUPIVACAINE-EPINEPHRINE 0.5% -1:200000 IJ SOLN
INTRAMUSCULAR | Status: DC | PRN
  Administered 2011-08-19: 20 mL

## 2011-08-19 MED ORDER — SODIUM CHLORIDE 0.9 % IV SOLN
3.0000 g | Freq: Four times a day (QID) | INTRAVENOUS | Status: DC
  Administered 2011-08-19: 3 g via INTRAVENOUS
  Filled 2011-08-19 (×4): qty 3

## 2011-08-19 MED ORDER — ROCURONIUM BROMIDE 100 MG/10ML IV SOLN
INTRAVENOUS | Status: DC | PRN
  Administered 2011-08-19: 30 mg via INTRAVENOUS

## 2011-08-19 MED ORDER — THERA M PLUS PO TABS: 1.0000 | ORAL_TABLET | Freq: Every day | ORAL | Status: DC

## 2011-08-19 MED ORDER — IOHEXOL 300 MG/ML  SOLN
INTRAMUSCULAR | Status: AC
  Filled 2011-08-19: qty 1

## 2011-08-19 MED ORDER — MORPHINE SULFATE 2 MG/ML IJ SOLN
INTRAMUSCULAR | Status: AC
  Administered 2011-08-19: 4 mg via INTRAVENOUS
  Filled 2011-08-19: qty 2

## 2011-08-19 MED ORDER — HEPARIN SODIUM (PORCINE) 5000 UNIT/ML IJ SOLN
5000.0000 [IU] | Freq: Three times a day (TID) | INTRAMUSCULAR | Status: DC
  Filled 2011-08-19 (×4): qty 1

## 2011-08-19 MED ORDER — HYDROMORPHONE BOLUS VIA INFUSION: 0.5000 mg | INTRAVENOUS | Status: DC | PRN

## 2011-08-19 MED ORDER — PSYLLIUM 95 % PO PACK
1.0000 | PACK | Freq: Two times a day (BID) | ORAL | Status: DC
  Filled 2011-08-19 (×2): qty 1

## 2011-08-19 MED ORDER — LACTATED RINGERS IV SOLN: INTRAVENOUS | Status: DC

## 2011-08-19 MED ORDER — FENTANYL CITRATE 0.05 MG/ML IJ SOLN
25.0000 ug | INTRAMUSCULAR | Status: DC | PRN
  Administered 2011-08-19 (×2): 25 ug via INTRAVENOUS

## 2011-08-19 MED ORDER — ZOLPIDEM TARTRATE 5 MG PO TABS: 5.0000 mg | ORAL_TABLET | Freq: Every evening | ORAL | Status: DC | PRN

## 2011-08-19 MED ORDER — HYDROMORPHONE HCL PF 1 MG/ML IJ SOLN: 0.5000 mg | INTRAMUSCULAR | Status: DC | PRN

## 2011-08-19 MED ORDER — ALBUTEROL SULFATE (5 MG/ML) 0.5% IN NEBU: 2.5000 mg | INHALATION_SOLUTION | Freq: Four times a day (QID) | RESPIRATORY_TRACT | Status: DC | PRN

## 2011-08-19 MED ORDER — CLONAZEPAM 1 MG PO TABS: 2.0000 mg | ORAL_TABLET | Freq: Three times a day (TID) | ORAL | Status: DC | PRN

## 2011-08-19 MED ORDER — IOHEXOL 300 MG/ML  SOLN
INTRAMUSCULAR | Status: DC | PRN
  Administered 2011-08-19: 50 mL

## 2011-08-19 MED ORDER — HYDROCODONE-ACETAMINOPHEN 5-325 MG PO TABS
1.0000 | ORAL_TABLET | ORAL | Status: DC | PRN
  Administered 2011-08-20: 1 via ORAL
  Filled 2011-08-19: qty 2

## 2011-08-19 MED ORDER — ALUM & MAG HYDROXIDE-SIMETH 200-200-20 MG/5ML PO SUSP: 30.0000 mL | Freq: Four times a day (QID) | ORAL | Status: DC | PRN

## 2011-08-19 MED ORDER — HYDROMORPHONE HCL PF 1 MG/ML IJ SOLN
1.0000 mg | INTRAMUSCULAR | Status: DC | PRN
  Administered 2011-08-19 – 2011-08-20 (×5): 1 mg via INTRAVENOUS
  Filled 2011-08-19 (×5): qty 1

## 2011-08-19 SURGICAL SUPPLY — 48 items
APL SKNCLS STERI-STRIP NONHPOA (GAUZE/BANDAGES/DRESSINGS) ×1
APPLIER CLIP ROT 10 11.4 M/L (STAPLE) ×2
APR CLP MED LRG 11.4X10 (STAPLE) ×1
BAG SPEC RTRVL LRG 6X4 10 (ENDOMECHANICALS) ×1
BENZOIN TINCTURE PRP APPL 2/3 (GAUZE/BANDAGES/DRESSINGS) ×2 IMPLANT
CABLE HIGH FREQUENCY MONO STRZ (ELECTRODE) ×2 IMPLANT
CANISTER SUCTION 2500CC (MISCELLANEOUS) ×2 IMPLANT
CHLORAPREP W/TINT 26ML (MISCELLANEOUS) ×2 IMPLANT
CLIP APPLIE ROT 10 11.4 M/L (STAPLE) ×1 IMPLANT
CLOSURE STERI STRIP 1/2 X4 (GAUZE/BANDAGES/DRESSINGS) ×1 IMPLANT
CLOTH BEACON ORANGE TIMEOUT ST (SAFETY) ×2 IMPLANT
COVER MAYO STAND STRL (DRAPES) ×2 IMPLANT
DECANTER SPIKE VIAL GLASS SM (MISCELLANEOUS) ×2 IMPLANT
DRAPE C-ARM 42X72 X-RAY (DRAPES) ×2 IMPLANT
DRAPE LAPAROSCOPIC ABDOMINAL (DRAPES) ×2 IMPLANT
ELECT REM PT RETURN 9FT ADLT (ELECTROSURGICAL) ×2
ELECTRODE REM PT RTRN 9FT ADLT (ELECTROSURGICAL) ×1 IMPLANT
GLOVE BIO SURGEON STRL SZ7.5 (GLOVE) ×1 IMPLANT
GLOVE BIOGEL PI IND STRL 6.5 (GLOVE) IMPLANT
GLOVE BIOGEL PI IND STRL 7.0 (GLOVE) ×1 IMPLANT
GLOVE BIOGEL PI IND STRL 7.5 (GLOVE) IMPLANT
GLOVE BIOGEL PI INDICATOR 6.5 (GLOVE) ×2
GLOVE BIOGEL PI INDICATOR 7.0 (GLOVE) ×1
GLOVE BIOGEL PI INDICATOR 7.5 (GLOVE) ×1
GLOVE INDICATOR 8.0 STRL GRN (GLOVE) ×1 IMPLANT
GLOVE SURG ORTHO 8.0 STRL STRW (GLOVE) ×2 IMPLANT
GLOVE SURG SS PI 7.0 STRL IVOR (GLOVE) ×3 IMPLANT
GOWN STRL NON-REIN LRG LVL3 (GOWN DISPOSABLE) ×5 IMPLANT
GOWN STRL REIN XL XLG (GOWN DISPOSABLE) ×4 IMPLANT
HEMOSTAT SURGICEL 4X8 (HEMOSTASIS) IMPLANT
KIT BASIN OR (CUSTOM PROCEDURE TRAY) ×2 IMPLANT
NS IRRIG 1000ML POUR BTL (IV SOLUTION) ×2 IMPLANT
POUCH SPECIMEN RETRIEVAL 10MM (ENDOMECHANICALS) ×2 IMPLANT
SCISSORS LAP 5X35 DISP (ENDOMECHANICALS) IMPLANT
SET CHOLANGIOGRAPH MIX (MISCELLANEOUS) ×2 IMPLANT
SET IRRIG TUBING LAPAROSCOPIC (IRRIGATION / IRRIGATOR) ×2 IMPLANT
SLEEVE Z-THREAD 5X100MM (TROCAR) ×2 IMPLANT
SOLUTION ANTI FOG 6CC (MISCELLANEOUS) ×2 IMPLANT
SPONGE GAUZE 4X4 12PLY (GAUZE/BANDAGES/DRESSINGS) ×1 IMPLANT
STRIP CLOSURE SKIN 1/2X4 (GAUZE/BANDAGES/DRESSINGS) ×2 IMPLANT
SUT MNCRL AB 4-0 PS2 18 (SUTURE) ×2 IMPLANT
TAPE CLOTH SURG 4X10 WHT LF (GAUZE/BANDAGES/DRESSINGS) ×1 IMPLANT
TOWEL OR 17X26 10 PK STRL BLUE (TOWEL DISPOSABLE) ×6 IMPLANT
TRAY LAP CHOLE (CUSTOM PROCEDURE TRAY) ×2 IMPLANT
TROCAR XCEL BLUNT TIP 100MML (ENDOMECHANICALS) ×2 IMPLANT
TROCAR Z-THREAD FIOS 11X100 BL (TROCAR) ×2 IMPLANT
TROCAR Z-THREAD FIOS 5X100MM (TROCAR) ×2 IMPLANT
TUBING INSUFFLATION 10FT LAP (TUBING) ×2 IMPLANT

## 2011-08-19 NOTE — OR Nursing (Signed)
Cell phone found in stretcher sheets , placed in labeled bag and give to H. Coble RN at front desk

## 2011-08-19 NOTE — Transfer of Care (Signed)
Immediate Anesthesia Transfer of Care Note  Patient: Lori Marshall  Procedure(s) Performed:  LAPAROSCOPIC CHOLECYSTECTOMY WITH INTRAOPERATIVE CHOLANGIOGRAM  Patient Location: PACU  Anesthesia Type: General  Level of Consciousness: sedated  Airway & Oxygen Therapy: Patient Spontanous Breathing and Patient connected to face mask  Post-op Assessment: Report given to PACU RN and Post -op Vital signs reviewed and stable  Post vital signs: Reviewed and stable  Complications: No apparent anesthesia complications

## 2011-08-19 NOTE — Anesthesia Preprocedure Evaluation (Addendum)
Anesthesia Evaluation  Patient identified by MRN, date of birth, ID band Patient awake    Reviewed: Allergy & Precautions, H&P , NPO status , Patient's Chart, lab work & pertinent test results, reviewed documented beta blocker date and time   Airway Mallampati: II TM Distance: >3 FB Neck ROM: full    Dental No notable dental hx. (+) Teeth Intact and Dental Advisory Given   Pulmonary neg pulmonary ROS,  clear to auscultation  Pulmonary exam normal       Cardiovascular Exercise Tolerance: Good neg cardio ROS regular Normal    Neuro/Psych  Headaches, PSYCHIATRIC DISORDERS Anxiety Depression    GI/Hepatic negative GI ROS, Neg liver ROS, GERD-  Medicated and Controlled,  Endo/Other  Hypothyroidism   Renal/GU negative Renal ROS  Genitourinary negative   Musculoskeletal   Abdominal   Peds  Hematology negative hematology ROS (+)   Anesthesia Other Findings   Reproductive/Obstetrics negative OB ROS                         Anesthesia Physical Anesthesia Plan  ASA: II  Anesthesia Plan: General   Post-op Pain Management:    Induction: Intravenous  Airway Management Planned: Oral ETT  Additional Equipment:   Intra-op Plan:   Post-operative Plan: Extubation in OR  Informed Consent: I have reviewed the patients History and Physical, chart, labs and discussed the procedure including the risks, benefits and alternatives for the proposed anesthesia with the patient or authorized representative who has indicated his/her understanding and acceptance.   Dental Advisory Given  Plan Discussed with: CRNA and Surgeon  Anesthesia Plan Comments:         Anesthesia Quick Evaluation

## 2011-08-19 NOTE — Anesthesia Postprocedure Evaluation (Signed)
  Anesthesia Post-op Note  Patient: Lori Marshall  Procedure(s) Performed:  LAPAROSCOPIC CHOLECYSTECTOMY WITH INTRAOPERATIVE CHOLANGIOGRAM  Patient Location: PACU  Anesthesia Type: General  Level of Consciousness: awake and alert   Airway and Oxygen Therapy: Patient Spontanous Breathing  Post-op Pain: mild  Post-op Assessment: Post-op Vital signs reviewed, Patient's Cardiovascular Status Stable, Respiratory Function Stable, Patent Airway and No signs of Nausea or vomiting  Post-op Vital Signs: stable  Complications: No apparent anesthesia complications

## 2011-08-19 NOTE — H&P (Signed)
NAMELUBNA, STEGEMAN NO.:  [id number]  MEDICAL RECORD NO.:  [id number]  LOCATION:  WA09                         FACILITY:  [facility]  PHYSICIAN:  Ardeth Sportsman, MD     DATE OF BIRTH:  08/01/1976  DATE OF ADMISSION:  08/18/2011 DATE OF DISCHARGE:                             HISTORY & PHYSICAL   PRIMARY CARE PHYSICIAN:  Lucky Cowboy, M.D.  GASTROENTEROLOGIST:  Iva Boop, MD, Clementeen Graham, with [facility].  SURGEON:  Lodema Pilot, MD  REQUESTING PHYSICIAN:  [facility].  REASON FOR EVALUATION:  Recurrent abdominal pain, probable cholecystitis.  HISTORY OF PRESENT ILLNESS:  [name] is an overweight 35 year old female who has struggled with intermittent abdominal pain for at least a year.  It has become more intense over the past 4 months.  She has had upper endoscopies which are underwhelming and followed by Gastroenterology for irritable bowel type syndrome.  Her pain became more focused in the right upper quadrant.  She saw Dr. Biagio Quint with Surgery.  Her ultrasound did not show any gallstones.  She did have a HIDA scan done recently.  It showed a gallbladder ejection fraction of 9% with reproduction of symptoms.  She was due to see Dr. Biagio Quint in a few days to consider surgery.  However, she woke up this morning with worsening nausea and abdominal pain.  It intensified and became unbearable.  She came to the emergency room in the middle of the night.  I just called the OR after I went to do some surgeries.  She notes she still is having some discomfort.  No nausea or vomiting. She normally is a little bit constipated, but her bowel has been moving a little looser.  No bleeding.  No sick contacts or travel history.  No dysphagia to solids or liquids.  She has some mild reflux issues, but that is pretty well controlled on her proton pump inhibitor, not related to physical activity rather physically active.  PAST MEDICAL  HISTORY: 1. History of prior alcohol gastritis and alcohol ingestion, she     claims to be abstinent. 2. Anxiety. 3. Migraines. 4. Irritable bowel syndrome. 5. Reflux. 6. Decreased gallbladder ejection fraction on HIDA scan. 7. Panic disorder with history of agoraphobia in the past. 8. History of depression. 9. Hypothyroidism.  PAST SURGICAL HISTORY:  She has had an appendectomy and had a wisdom teeth removed.  SOCIAL HISTORY:  As noted above.  Smokes about half pack a day for the past 5 years.  History of moderate alcohol use in the past.  She denies currently the same thing with drugs.  FAMILY HISTORY:  Both parents are alive and well as far as she can tell. She does have father with diabetes and a polyp.  REVIEW OF SYSTEMS:  As noted per HPI.  GENERAL:  No definite fevers or sweats, but some occasional chills.  Appetite is down with some fatigue. EYES:  No icterus or changes.  No eye discharge.  HEENT:  No recent sore throats or odynophagia.  LUNGS:  No recent cold, coughs, flus, or productive cough.  CARDIOVASCULAR:  No exertional chest pain or orthopnea.  GI:  As noted above.  No hematochezia, melena, hematemesis, or dysphagia.  GU:  No dysuria, hematuria, pyuria, or urgency.  GYN:  No vaginal bleeding or discharge.  No pain with periods.  MUSCULOSKELETAL: Some lower back pain, but no shoulder pain.  A little bit of right-sided arm pain.  HEMATOLOGIC/ALLERGIC:  Otherwise negative.  GI:  She saw her stools are a little lightly tan colored.  PHYSICAL EXAMINATION:  GENERAL:  She is a well-developed, well- nourished, overweight female, not toxic appearing, but very emotional, labile, and tearful. PSYCH:  She is very tearful but consolable, but pleasant and full of gratitude.  There is no evidence of any dementia, delirium, psychosis, or paranoia. HEENT:  Eyes; pupils equal, round, and reactive to light.  Her sclerae are nonicteric nor injected.  She is normocephalic.  Her  mucous membranes are dry, but nasopharynx and oropharynx are clear. NECK:  Supple.  No masses.  Trachea is midline. HEART:  Regular rate and rhythm.  No murmurs, clicks, or rubs. CHEST:  Clear to auscultation bilaterally.  No wheezes, rales, or rhonchi. BREASTS:  No nipple discharge or masses.  Some pain to sternal greater than rib compression. ABDOMEN:  Overweight, but soft.  She is exquisitely tender in the right upper quadrant with a positive Murphy sign.  Mild discomfort in the epigastric and left upper quadrant.  Lower abdomen not involved. Umbilicus, no umbilical hernia. GENITAL:  No vaginal bleeding or inguinal hernias. RECTAL:  Deferred. MUSCULOSKELETAL:  Pretty good range of motion at shoulders, elbows, wrists as well as hips, knees, and ankles.  LABORATORY STUDIES:  She has a mildly elevated white count.  Her LFTs are normal.  She had an ultrasound, that was normal.  She does have a HIDA scan, which showed a gallbladder ejection fraction 9% with reproduction of symptoms.  Ultrasound, negative.  Endoscopy, negative.  ASSESSMENT AND PLAN:  A 35 year old anxious female with some psychological issues that are stable with intermittent abdominal pain for now, become more focal and consistent with biliary dyskinesia. Worsening severe attack, unrelenting, suspicious for cholecystitis. 1. Admit. 2. IV antibiotics. 3. IV fluids. 4. Laparoscopic cholecystectomy. Dr. Gerrit Friends is coming on today, so hopefully we will be able to do this later this AM.  Pathophysiology and cholecystitis was discussed.  Options were discussed and recommendation was made for laparoscopic cholecystectomy.  Risks, benefits, and alternatives were discussed in detail.  She felt reassured. 1. Irritable bowel syndrome - bland diet, bowel regimen & follow post lap chole 2. Antidepression. 3. Follow up on need for acyclovir.  I do not see any obvious rashes     or warts concerned. 4. Continue PPI. 5. Follow  up with Gastroenterology if this does not help resolve her     symptoms. 6. Control musculoskeletal pain with heat.  Hold off any nonsteroidals until after surgery      Ardeth Sportsman, MD     SCG/MEDQ  D:  08/19/2011  T:  08/19/2011  Job:  [id number]

## 2011-08-19 NOTE — Op Note (Signed)
Laparoscopic Cholecystectomy with IOC Procedure Note  Pre-operative Diagnosis: biliary dyskinesia, cholecystitis   Post-operative Diagnosis: Same  Surgeon:  Velora Heckler, MD, FACS  Assistant:  Zola Button, PA-C   Anesthesia:  General  Indications: This patient presents with symptomatic gallbladder disease and will undergo laparoscopic cholecystectomy.  Procedure Details  The patient was seen again in the Holding Room. The risks, benefits, complications, treatment options, and expected outcomes were discussed with the patient. The patient and/or family concurred with the proposed plan, giving informed consent.  The patient was taken to Operating Room, identified as Lori Marshall and the procedure verified as Laparoscopic Cholecystectomy with Intraoperative Cholangiograms. A Time Out was held and the above information confirmed.  Prior to the induction of general anesthesia, antibiotic prophylaxis was administered. General endotracheal anesthesia was then administered and tolerated well. After the induction, the abdomen was prepped in the usual aseptic fashion. The patient was positioned in the supine position with some reverse Trendelenburg.  An incision was made in the skin near the umbilicus. The midline fascia was incised and the Hasson canula was introduced under direct vision. It was secured with a pursestring 0-Vicryl suture placed in the usual fashion. Pneumoperitoneum was then created with CO2 and tolerated well without any adverse changes in the patient's vital signs. Additional trocars were introduced under direct vision along the right costal margin in the midline, mid-clavicular line, and mid-axillary line.  The gallbladder was identified and the fundus grasped and retracted cephalad. Adhesions were lysed bluntly and with the electrocautery where needed, taking care not to injure any adjacent structures. The infundibulum was grasped and retracted laterally, exposing the  peritoneum overlying the triangle of Calot. This was then divided and exposed in a blunt fashion. The cystic duct was clearly identified and bluntly dissected circumferentially.  An incision was made in the cystic duct and the cholangiogram catheter introduced. The catheter was secured using an ligaclip.  Real-time cholangiography was performed using the C-arm.  There was rapid filling of a normal caliber common bile duct.  There was reflux of contrast into the left and right hepatic ductal systems.  There was free flow distally into the duodenum without filling defect or obstruction.  Catheter was removed from the peritoneal cavity.  The cystic duct was then triply ligated with surgical clips on the patient side and singly clipped on the gallbladder side and divided. The cystic artery was identified, dissected free, ligated with clips and divided as well.   The gallbladder was dissected from the liver bed with the electrocautery used for hemostasis. The gallbladder was completely removed and placed into an endocatch bag. The right upper quadrant was irrigated and inspected. Hemostasis was achieved with the electrocautery. Copious irrigation was utilized and was repeatedly aspirated until clear.  Pneumoperitoneum was released after viewing removal of the trocars with good hemostasis. The umbilical wound was irrigated and the fascia was then closed with the pursestring suture; the skin was then closed with 4-0 Monocril subcuticular sutures and a sterile dressing was applied.  Instrument, sponge, and needle counts were correct at closure and at the conclusion of the case.  Velora Heckler, MD, FACS General & Endocrine Surgery [facility]   Findings: Cholecystitis without Cholelithiasis  Estimated Blood Loss: Minimal         Drains: None          Specimens: Gallbladder to pathology       Complications: None; patient tolerated the procedure well.  Disposition: PACU -  hemodynamically stable.         Condition: stable

## 2011-08-20 ENCOUNTER — Encounter (INDEPENDENT_AMBULATORY_CARE_PROVIDER_SITE_OTHER): Payer: Self-pay | Admitting: General Surgery

## 2011-08-20 DIAGNOSIS — Z23 Encounter for immunization: Secondary | ICD-10-CM

## 2011-08-20 MED ORDER — ACETAMINOPHEN 325 MG PO TABS: 325.0000 mg | ORAL_TABLET | Freq: Four times a day (QID) | ORAL | Status: AC | PRN

## 2011-08-20 MED ORDER — HYDROCODONE-ACETAMINOPHEN 5-325 MG PO TABS: 1.0000 | ORAL_TABLET | ORAL | Status: AC | PRN

## 2011-08-20 MED ORDER — PNEUMOCOCCAL VAC POLYVALENT 25 MCG/0.5ML IJ INJ
0.5000 mL | INJECTION | INTRAMUSCULAR | Status: AC
  Administered 2011-08-20: 0.5 mL via INTRAMUSCULAR
  Filled 2011-08-20: qty 0.5

## 2011-08-20 MED ORDER — INFLUENZA VIRUS VACC SPLIT PF IM SUSP
0.5000 mL | INTRAMUSCULAR | Status: AC
  Administered 2011-08-20: 0.5 mL via INTRAMUSCULAR
  Filled 2011-08-20: qty 0.5

## 2011-08-20 NOTE — ED Provider Notes (Signed)
Medical screening examination/treatment/procedure(s) were performed by non-physician practitioner and as supervising physician I was immediately available for consultation/collaboration.  Flint Melter, MD 08/20/11 [phone]

## 2011-08-20 NOTE — Discharge Summary (Signed)
Physician Discharge Summary  Patient ID: Lori Marshall MRN: [id number] DOB/AGE: 12-30-1975 35 y.o.  Admit date: 08/18/2011 Discharge date: 08/20/2011  Admission Diagnoses: Biliary dyskinesia, cholecystitis.  Discharge Diagnoses: Same. Principal Problem:  *Acute cholecystitis with chronic cholecystitis Active Problems:  PANIC DISORDER  DEPRESSION  GERD  Irritable bowel syndrome  VOMITING  Diarrhea  TRANSAMINASES, SERUM, ELEVATED   PROCEDURES: Laparoscopic cholecystectomy, intraoperative cholangiogram.  Hospital Course: Patient is a 35 year old female with ongoing difficulty with intermittent abdominal pain for at least a year. She's had upper endoscopies which showed no significant medical issues. She is followed by gastroenterology for irritable bowel syndrome. She underwent a HIDA scan was evaluated by Dr. Lodema Pilot. This showed an ejection fraction of 9% with reproduction of symptoms on CCK infusion. She is scheduled for followup with Dr. Biagio Quint awoke with worsening pain and nausea. She presented to the emergency room where she was seen in consultation by Dr. Karie Soda. He subsequently admitted the patient for elective cholecystectomy. She was seen the following a.m. by Dr. Darnell Level. He agreed with Dr. gross patient was taken the operating room where she underwent elective cholecystectomy, intraoperative cholangiogram. This showed no evidence of retained stone or obstruction. Patient was transferred to the floor. She's hemodynamically stable. We plan to mobilize her, advance her diet, he does well discharge her home after lunch. Her incisions looked fine, there are Steri-Stripped. She'll be instructed to remove the Steri-Strips in 5-7 days for a come off in the shower. Followup with Dr. Gerrit Friends, or  in the [facility] clinic in 3 weeks. Discharge meds: See below. Condition on discharge: Improved. [facility] physician assistant for Dr. Darnell Level.          Disposition:  Home or Self Care  Discharge Orders    Future Appointments: Provider: Department: Dept Phone: Center:   08/21/2011 12:00 PM Rulon Abide, DO [facility] [phone] None     Current Discharge Medication List    START taking these medications   Details  acetaminophen (TYLENOL) 325 MG tablet Take 1-2 tablets (325-650 mg total) by mouth every 6 (six) hours as needed. Qty: 30 tablet    HYDROcodone-acetaminophen (NORCO) 5-325 MG per tablet Take 1-2 tablets by mouth every 4 (four) hours as needed. Qty: 40 tablet, Refills: 0      CONTINUE these medications which have NOT CHANGED   Details  acyclovir (ZOVIRAX) 800 MG tablet Take 800 mg by mouth daily as needed. For fever blisters.    butalbital-acetaminophen-caffeine (FIORICET WITH CODEINE) 50-325-40-30 MG per capsule Take 1-2 capsules by mouth every 4 (four) hours as needed. As needed for headache     clonazePAM (KLONOPIN) 2 MG tablet Take 2 mg by mouth 3 (three) times daily as needed. For anxiety. Takes a half to one tablet my by mouth as needed    esomeprazole (NEXIUM) 40 MG capsule Take 40 mg by mouth daily as needed. For stomach upset.    hyoscyamine (LEVSINEX) 0.375 MG 12 hr capsule Take 0.375 mg by mouth 2 (two) times daily as needed. For stomach pain. Takes one to two tablet by mouth .    levothyroxine (SYNTHROID, LEVOTHROID) 50 MCG tablet Take 50 mcg by mouth daily.     metoCLOPramide (REGLAN) 10 MG tablet Take 10 mg by mouth at bedtime and may repeat dose one time if needed. For cramping.    Multiple Vitamins-Minerals (ADULT GUMMY) CHEW Chew 1 tablet by mouth.      Multiple Vitamins-Minerals (MULTIVITAMINS THER. W/MINERALS) TABS Take  1 tablet by mouth daily.      sertraline (ZOLOFT) 100 MG tablet Take 200 mg by mouth daily.     traMADol (ULTRAM) 50 MG tablet Take 50 mg by mouth every 6 (six) hours as needed. Maximum dose= 8 tablets per day-FOR PAIN       Follow-up Information    Follow up with Velora Heckler,  MD in 3 weeks. (Ask for [facility] or DR. Gerkin.  Call if you have any problems with surgery as needed.)    Contact information:   [facility] [address] [place] [address] [phone]          Signed: Sherrie George 08/20/2011, 12:04 PM

## 2011-08-20 NOTE — Progress Notes (Signed)
D/C instructions given, patient verbalized understanding,comforatble at this time, stable, d/c home, picked up by her mother, prescriptions given

## 2011-08-20 NOTE — Progress Notes (Signed)
1 Day Post-Op  Subjective: Doing fairly well after surgery.  Some pain and anxiety issues.  Tolerating clears.  Will advance diet.  Objective: Vital signs in last 24 hours: Temp:  [98 F (36.7 C)-99.2 F (37.3 C)] 98.6 F (37 C) (12/19 0631) Pulse Rate:  [79-101] 79  (12/19 0631) Resp:  [15-25] 16  (12/19 0631) BP: (95-110)/(60-87) 97/60 mmHg (12/19 0631) SpO2:  [93 %-100 %] 95 % (12/19 0631) FiO2 (%):  [2 %] 2 % (12/18 1942) Weight:  [68.04 kg (150 lb)] 150 lb (68.04 kg) (12/18 1515) Last BM Date: 08/18/11  Intake/Output from previous day: 12/18 0701 - 12/19 0700 In: 2748 [I.V.:2748] Out: 3805 [Urine:3800; Blood:5] Intake/Output this shift:    General appearance: alert, cooperative and no distress GI: soft, non-tender; bowel sounds normal; no masses,  no organomegaly and Somewhat tender, after surgery, but incisions look good.  Lab Results:   [facility] 08/19/11 0020  WBC 5.2  HGB 11.2*  HCT 33.0*  PLT 253    BMET  Basename 08/19/11 0020  NA 136  K 3.7  CL 103  CO2 25  GLUCOSE 105*  BUN 9  CREATININE 0.66  CALCIUM 8.8   PT/INR No results found for this basename: LABPROT:2,INR:2 in the last 72 hours   Studies/Results: Dg Cholangiogram Operative  08/19/2011  *RADIOLOGY REPORT*  Clinical Data:   Cholecystectomy.  INTRAOPERATIVE CHOLANGIOGRAM  Comparison: Ultrasound 08/04/2011  Findings: Intraoperative spot images show normal caliber biliary system.  No evidence of retained stone or obstruction.  Free passage of contrast into the small bowel noted.  IMPRESSION: No evidence of retained stone or obstruction.  These images were submitted for radiologic interpretation only. Please see the procedural report for the amount of contrast and the fluoroscopy time utilized.  Original Report Authenticated By: Cyndie Chime, M.D.    Anti-infectives: Anti-infectives     Start     Dose/Rate Route Frequency Ordered Stop   08/19/11 0600   Ampicillin-Sulbactam (UNASYN) 3 g in  sodium chloride 0.9 % 100 mL IVPB  Status:  Discontinued        3 g 100 mL/hr over 60 Minutes Intravenous Every 6 hours 08/19/11 0542 08/19/11 1537         Current Facility-Administered Medications  Medication Dose Route Frequency Provider Last Rate Last Dose  . acetaminophen (TYLENOL) tablet 325-650 mg  325-650 mg Oral Q6H PRN Ardeth Sportsman, MD      . acetaminophen (TYLENOL) tablet 650 mg  650 mg Oral Q4H PRN Velora Heckler, MD      . albuterol (PROVENTIL) (5 MG/ML) 0.5% nebulizer solution 2.5 mg  2.5 mg Nebulization Q6H PRN Ardeth Sportsman, MD      . alum & mag hydroxide-simeth (MAALOX/MYLANTA) 200-200-20 MG/5ML suspension 30 mL  30 mL Oral Q6H PRN Ardeth Sportsman, MD      . bisacodyl (DULCOLAX) suppository 10 mg  10 mg Rectal Q12H PRN Ardeth Sportsman, MD      . clonazePAM Scarlette Calico) tablet 2 mg  2 mg Oral TID PRN Ardeth Sportsman, MD      . diphenhydrAMINE (BENADRYL) injection 12.5-25 mg  12.5-25 mg Intravenous Q6H PRN Ardeth Sportsman, MD   25 mg at 08/19/11 0818  . fentaNYL (SUBLIMAZE) 0.05 MG/ML injection           . guaiFENesin-dextromethorphan (ROBITUSSIN DM) 100-10 MG/5ML syrup 15 mL  15 mL Oral Q4H PRN Ardeth Sportsman, MD      . HYDROcodone-acetaminophen [facility])  5-325 MG per tablet 1-2 tablet  1-2 tablet Oral Q4H PRN Velora Heckler, MD   2 tablet at 08/19/11 1942  . HYDROmorphone (DILAUDID) injection 1 mg  1 mg Intravenous Q2H PRN Velora Heckler, MD   1 mg at 08/20/11 1015  . hyoscyamine (LEVSINEX) 12 hr capsule 0.375 mg  0.375 mg Oral BID PRN Ardeth Sportsman, MD      . influenza  inactive virus vaccine (FLUZONE/FLUARIX) injection 0.5 mL  0.5 mL Intramuscular Tomorrow-1000 Velora Heckler, MD      . lactated ringers infusion   Intravenous Continuous Velora Heckler, MD 75 mL/hr at 08/20/11 0315    . levothyroxine (SYNTHROID, LEVOTHROID) tablet 50 mcg  50 mcg Oral Daily Ardeth Sportsman, MD   50 mcg at 08/20/11 1015  . menthol-cetylpyridinium (CEPACOL) lozenge 3 mg  1 lozenge Oral PRN  Ardeth Sportsman, MD      . pantoprazole (PROTONIX) EC tablet 40 mg  40 mg Oral BID AC Ardeth Sportsman, MD   40 mg at 08/20/11 0756  . pneumococcal 23 valent vaccine (PNU-IMMUNE) injection 0.5 mL  0.5 mL Intramuscular Tomorrow-1000 Velora Heckler, MD      . promethazine (PHENERGAN) injection 12.5 mg  12.5 mg Intravenous Q6H PRN Velora Heckler, MD   12.5 mg at 08/20/11 0705  . promethazine (PHENERGAN) injection 12.5-25 mg  12.5-25 mg Intravenous Q6H PRN Ardeth Sportsman, MD      . sertraline (ZOLOFT) tablet 200 mg  200 mg Oral Daily Ardeth Sportsman, MD   200 mg at 08/20/11 1015  . zolpidem (AMBIEN) tablet 5-10 mg  5-10 mg Oral QHS PRN Ardeth Sportsman, MD      . DISCONTD: 0.9 % irrigation (POUR BTL)    PRN Velora Heckler, MD   1,000 mL at 08/19/11 1051  . DISCONTD: Ampicillin-Sulbactam (UNASYN) 3 g in sodium chloride 0.9 % 100 mL IVPB  3 g Intravenous Q6H Ardeth Sportsman, MD   3 g at 08/19/11 0604  . DISCONTD: bupivacaine-EPINEPHrine (MARCAINE W/ EPI) 0.5 % (with pres) injection    PRN Velora Heckler, MD   20 mL at 08/19/11 1155  . DISCONTD: fentaNYL (SUBLIMAZE) injection 25-50 mcg  25-50 mcg Intravenous Q5 min PRN Gaetano Hawthorne, MD   25 mcg at 08/19/11 1426  . DISCONTD: Flora-Q (FLORA-Q) Capsule 1 capsule  1 capsule Oral Daily Ardeth Sportsman, MD      . DISCONTD: heparin injection 5,000 Units  5,000 Units Subcutaneous Q8H Ardeth Sportsman, MD      . DISCONTD: HYDROcodone-acetaminophen (NORCO) 5-325 MG per tablet 1-2 tablet  1-2 tablet Oral Q4H PRN Velora Heckler, MD   1 tablet at 08/20/11 0257  . DISCONTD: HYDROmorphone (DILAUDID) injection 0.5-2 mg  0.5-2 mg Intravenous Q30 min PRN Jacquenette Shone Crowford Darlina Guys., PHARMD   1 mg at 08/19/11 0818  . DISCONTD: iohexol (OMNIPAQUE) 300 MG/ML solution    PRN Velora Heckler, MD   50 mL at 08/19/11 1145  . DISCONTD: lactated ringers infusion   Intravenous Continuous Gaetano Hawthorne, MD      . DISCONTD: lactated ringers infusion   Intravenous Continuous Gaetano Hawthorne, MD 100 mL/hr at 08/19/11 1000 1,000 mL at 08/19/11 1000  . DISCONTD: lactated ringers infusion    PRN Velora Heckler, MD   1,000 mL at 08/19/11 1051  . DISCONTD: lip balm (CARMEX) ointment 1 application  1 application Topical BID  Ardeth Sportsman, MD      . DISCONTD: magic mouthwash  15 mL Oral QID PRN Ardeth Sportsman, MD      . DISCONTD: metoCLOPramide (REGLAN) tablet 10 mg  10 mg Oral QHS,MR X 1 Ardeth Sportsman, MD      . DISCONTD: metoprolol (LOPRESSOR) injection 5 mg  5 mg Intravenous Q6H PRN Ardeth Sportsman, MD      . DISCONTD: multivitamins ther. w/minerals tablet 1 tablet  1 tablet Oral Daily Ardeth Sportsman, MD      . DISCONTD: ondansetron (ZOFRAN) injection 4 mg  4 mg Intravenous Q6H Ardeth Sportsman, MD   4 mg at 08/19/11 0600  . DISCONTD: ondansetron (ZOFRAN) injection 4-8 mg  4-8 mg Intravenous Q6H PRN Ardeth Sportsman, MD   4 mg at 08/19/11 1610  . DISCONTD: oxyCODONE (Oxy IR/ROXICODONE) immediate release tablet 5-10 mg  5-10 mg Oral Q4H PRN Ardeth Sportsman, MD      . DISCONTD: promethazine (PHENERGAN) injection 6.25-12.5 mg  6.25-12.5 mg Intravenous Q15 min PRN Gaetano Hawthorne, MD      . DISCONTD: psyllium (HYDROCIL/METAMUCIL) packet 1 packet  1 packet Oral BID Ardeth Sportsman, MD       Facility-Administered Medications Ordered in Other Encounters  Medication Dose Route Frequency Provider Last Rate Last Dose  . DISCONTD: ceFAZolin (ANCEF) IVPB 1 g/50 mL premix    PRN Joycie Peek   1 g at 08/19/11 1108  . DISCONTD: dexamethasone (DECADRON) injection    PRN Joycie Peek   10 mg at 08/19/11 1115  . DISCONTD: fentaNYL (SUBLIMAZE) injection    PRN Joycie Peek   50 mcg at 08/19/11 1058  . DISCONTD: lactated ringers infusion    Continuous PRN Joycie Peek      . DISCONTD: lidocaine (cardiac) 100 mg/44ml (XYLOCAINE) 20 MG/ML injection 2%    PRN Steve Alday   50 mg at 08/19/11 1103  . DISCONTD: propofol (DIPRIVAN) 10 mg/mL bolus    PRN Steve Alday   130 mg at 08/19/11 1103  . DISCONTD:  rocuronium (ZEMURON) injection    PRN Joycie Peek   30 mg at 08/19/11 1115  . DISCONTD: succinylcholine (ANECTINE) injection    PRN Joycie Peek   100 mg at 08/19/11 1103    Assessment/Plan Biliary Dyskinesia, Cholecystitis; POD1 Laparoscopic Cholecystectomy. Patient Active Problem List  Diagnoses  . PANIC DISORDER  . DEPRESSION  . MIGRAINE HEADACHE  . GERD  . Irritable bowel syndrome  . VOMITING  . Diarrhea  . TRANSAMINASES, SERUM, ELEVATED  . Acute cholecystitis with chronic cholecystitis  Plan:  Advance diet, mobilize, aim for D/C after lunch.   LOS: 2 days    Lori Marshall 08/20/2011

## 2011-08-21 ENCOUNTER — Encounter (HOSPITAL_COMMUNITY): Payer: Self-pay | Admitting: Surgery

## 2011-08-21 ENCOUNTER — Encounter (INDEPENDENT_AMBULATORY_CARE_PROVIDER_SITE_OTHER): Payer: Self-pay | Admitting: General Surgery

## 2011-08-22 ENCOUNTER — Encounter (INDEPENDENT_AMBULATORY_CARE_PROVIDER_SITE_OTHER): Payer: Self-pay | Admitting: General Surgery

## 2011-09-03 ENCOUNTER — Encounter (INDEPENDENT_AMBULATORY_CARE_PROVIDER_SITE_OTHER): Payer: Medicaid Other

## 2011-10-29 ENCOUNTER — Encounter (HOSPITAL_COMMUNITY): Payer: Self-pay | Admitting: *Deleted

## 2011-10-29 ENCOUNTER — Emergency Department (HOSPITAL_COMMUNITY): Payer: Medicaid Other

## 2011-10-29 ENCOUNTER — Emergency Department (HOSPITAL_COMMUNITY)
Admission: EM | Admit: 2011-10-29 | Discharge: 2011-10-29 | Disposition: A | Discharge status: Home / Self Care | Payer: Medicaid Other | Attending: Emergency Medicine | Admitting: Emergency Medicine

## 2011-10-29 ENCOUNTER — Encounter: Payer: Self-pay | Admitting: *Deleted

## 2011-10-29 DIAGNOSIS — S1093XA Contusion of unspecified part of neck, initial encounter: Secondary | ICD-10-CM | POA: Insufficient documentation

## 2011-10-29 DIAGNOSIS — F17200 Nicotine dependence, unspecified, uncomplicated: Secondary | ICD-10-CM | POA: Insufficient documentation

## 2011-10-29 DIAGNOSIS — F411 Generalized anxiety disorder: Secondary | ICD-10-CM | POA: Insufficient documentation

## 2011-10-29 DIAGNOSIS — E039 Hypothyroidism, unspecified: Secondary | ICD-10-CM | POA: Insufficient documentation

## 2011-10-29 DIAGNOSIS — S0083XA Contusion of other part of head, initial encounter: Secondary | ICD-10-CM

## 2011-10-29 DIAGNOSIS — F419 Anxiety disorder, unspecified: Secondary | ICD-10-CM

## 2011-10-29 DIAGNOSIS — S0003XA Contusion of scalp, initial encounter: Secondary | ICD-10-CM | POA: Insufficient documentation

## 2011-10-29 LAB — POCT PREGNANCY, URINE: Preg Test, Ur: NEGATIVE

## 2011-10-29 MED ORDER — LORAZEPAM 1 MG PO TABS
1.0000 mg | ORAL_TABLET | Freq: Once | ORAL | Status: AC
  Administered 2011-10-29: 1 mg via ORAL
  Filled 2011-10-29: qty 1

## 2011-10-29 MED ORDER — AZITHROMYCIN 1 G PO PACK
1.0000 g | PACK | Freq: Once | ORAL | Status: AC
  Administered 2011-10-29: 1 g via ORAL

## 2011-10-29 MED ORDER — PROMETHAZINE HCL 25 MG PO TABS
25.0000 mg | ORAL_TABLET | Freq: Four times a day (QID) | ORAL | Status: DC | PRN
  Administered 2011-10-29: 25 mg via ORAL

## 2011-10-29 MED ORDER — METRONIDAZOLE 500 MG PO TABS
2000.0000 mg | ORAL_TABLET | Freq: Once | ORAL | Status: AC
  Administered 2011-10-29: 2000 mg via ORAL

## 2011-10-29 MED ORDER — PROMETHAZINE HCL 25 MG PO TABS
ORAL_TABLET | ORAL | Status: AC
  Filled 2011-10-29: qty 3

## 2011-10-29 MED ORDER — CEFIXIME 400 MG PO TABS
400.0000 mg | ORAL_TABLET | Freq: Once | ORAL | Status: AC
  Administered 2011-10-29: 400 mg via ORAL

## 2011-10-29 MED ORDER — IBUPROFEN 800 MG PO TABS: 800.0000 mg | ORAL_TABLET | Freq: Three times a day (TID) | ORAL | Status: AC

## 2011-10-29 MED ORDER — CIPROFLOXACIN HCL 500 MG PO TABS: 500.0000 mg | ORAL_TABLET | Freq: Once | ORAL | Status: DC

## 2011-10-29 MED ORDER — AZITHROMYCIN 1 G PO PACK
PACK | ORAL | Status: AC
  Filled 2011-10-29: qty 1

## 2011-10-29 MED ORDER — CEFIXIME 400 MG PO TABS
ORAL_TABLET | ORAL | Status: AC
  Filled 2011-10-29: qty 1

## 2011-10-29 MED ORDER — METRONIDAZOLE 500 MG PO TABS
ORAL_TABLET | ORAL | Status: AC
  Filled 2011-10-29: qty 4

## 2011-10-29 MED ORDER — LEVONORGESTREL 0.75 MG PO TABS: 1.5000 mg | ORAL_TABLET | Freq: Once | ORAL | Status: DC

## 2011-10-29 MED ORDER — LEVONORGESTREL 0.75 MG PO TABS
ORAL_TABLET | ORAL | Status: AC
  Filled 2011-10-29: qty 2

## 2011-10-29 MED ORDER — IBUPROFEN 800 MG PO TABS
ORAL_TABLET | ORAL | Status: AC
  Filled 2011-10-29: qty 1
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
## 2011-11-20 ENCOUNTER — Encounter: Payer: Self-pay | Admitting: Obstetrics and Gynecology

## 2011-11-20 ENCOUNTER — Ambulatory Visit (INDEPENDENT_AMBULATORY_CARE_PROVIDER_SITE_OTHER): Payer: Medicaid Other | Admitting: Obstetrics and Gynecology

## 2011-11-20 VITALS — BP 108/81 | HR 94 | Temp 97.2°F | Ht 66.0 in | Wt 163.8 lb

## 2011-11-20 DIAGNOSIS — IMO0002 Reserved for concepts with insufficient information to code with codable children: Secondary | ICD-10-CM

## 2011-11-20 LAB — HEPATITIS C ANTIBODY: HCV Ab: NEGATIVE

## 2011-11-20 LAB — HEPATITIS B SURFACE ANTIGEN: Hepatitis B Surface Ag: NEGATIVE

## 2011-11-20 NOTE — Patient Instructions (Signed)
Chlamydia Test This a test to see if you have Chlamydia which is a common sexually transmitted disease (STD). You get this disease by having sexual contact (oral, vaginal, or anal) with an infected person. An infected mother can spread the disease to her baby during childbirth. If this test is positive, you have a sexually transmitted disease (STD). DO NOT have unprotected sex until the treatment is complete and you have been retested and are negative. PREPARATION FOR TEST Your caregiver will use a swab to take a sample. The swab may be from the cervix, urethra, penis, anus, or throat. It may be possible to use a urine sample, if the lab where the sample is sent is able to test urine for this disease. NORMAL FINDINGS  No Growth   Antibodies: less than 1:640  Ranges for normal findings may vary among different laboratories and hospitals. You should always check with your doctor after having lab work or other tests done to discuss the meaning of your test results and whether your values are considered within normal limits. MEANING OF TEST  Your caregiver will go over the test results with you and discuss the importance and meaning of your results, as well as treatment options and the need for additional tests if necessary. OBTAINING THE TEST RESULTS It is your responsibility to obtain your test results. Ask the lab or department performing the test when and how you will get your results. Document Released: 09/10/2004 Document Revised: 08/07/2011 Document Reviewed: 07/27/2008 [facility] Patient Information 2012 [place], [place].Safer Sex Your caregiver wants you to have this information about the infections that can be transmitted from sexual contact and how to prevent them. The idea behind safer sex is that you can be sexually active, and at the same time reduce the risk of giving or getting a sexually transmitted disease (STD). Every person should be aware of how to prevent him or herself and his or her  sex partner from getting an STD. CAUSES OF STDS STDs are transmitted by sharing body fluids, which contain viruses and bacteria. The following fluids all transmit infections during sexual intercourse and sex acts:  Semen.   Saliva.   Urine.   Blood.   Vaginal mucus.  Examples of STDs include:  Chlamydia.   Gonorrhea.   Genital herpes.   Hepatitis B.   Human immunodeficiency virus or acquired immunodeficiency syndrome (HIV or AIDS).   Syphilis.   Trichomonas.   Pubic lice.   Human papillomavirus (HPV), which may include:   Genital warts.   Cervical dysplasia.   Cervical cancer (can develop with certain types of HPV).  SYMPTOMS  Sexual diseases often cause few or no symptoms until they are advanced, so a person can be infected and spread the infection without knowing it. Some STDs respond to treatment very well. Others, like HIV and herpes, cannot be cured, but are treated to reduce their effects. Specific symptoms include:  Abnormal vaginal discharge.   Irritation or itching in and around the vagina, and in the pubic hair.   Pain during sexual intercourse.   Bleeding during sexual intercourse.   Pelvic or abdominal pain.   Fever.   Growths in and around the vagina.   An ulcer in or around the vagina.   Swollen glands in the groin area.  DIAGNOSIS   Blood tests.   Pap test.   Culture test of abnormal vaginal discharge.   A test that applies a solution and examines the cervix with a lighted magnifying scope (  colposcopy).   A test that examines the pelvis with a lighted tube, through a small incision (laparoscopy).  TREATMENT  The treatment will depend on the cause of the STD.  Antibiotic treatment by injection, oral, creams, or suppositories in the vagina.   Over-the-counter medicated shampoo, to get rid of pubic lice.   Removing or treating growths with medicine, freezing, burning (electrocautery), or surgery.   Surgery treatment for HPV of  the cervix.   Supportive medicines for herpes, HIV, AIDS, and hepatitis.  Being careful cannot eliminate all risk of infection, but sex can be made much safer. Safe sexual practices include body massage and gentle touching. Masturbation is safe, as long as body fluids do not contact skin that has sores or cuts. Dry kissing and oral sex on a man wearing a latex condom or on a woman wearing a female condom is also safe. Slightly less safe is intercourse while the man wears a latex condom or wet kissing. It is also safer to have one sex partner that you know is not having sex with anyone else. LENGTH OF ILLNESS An STD might be treated and cured in a week, sometimes a month, or more. And it can linger with symptoms for many years. STDs can also cause damage to the female organs. This can cause chronic pain, infertility, and recurrence of the STD, especially herpes, hepatitis, HIV, and HPV. HOME CARE INSTRUCTIONS AND PREVENTION  Alcohol and recreational drugs are often the reason given for not practicing safer sex. These substances affect your judgment. Alcohol and recreational drugs can also impair your immune system, making you more vulnerable to disease.   Do not engage in risky and dangerous sexual practices, including:   Vaginal or anal sex without a condom.   Oral sex on a man without a condom.   Oral sex on a woman without a female condom.   Using saliva to lubricate a condom.   Any other sexual contact in which body fluids or blood from one partner contact the other partner.   You should use only latex condoms for men and water soluble lubricants. Petroleum based lubricants or oils used to lubricate a condom will weaken the condom and increase the chance that it will break.   Think very carefully before having sex with anyone who is high risk for STDs and HIV. This includes IV drug users, people with multiple sexual partners, or people who have had an STD, or a positive hepatitis or HIV  blood test.   Remember that even if your partner has had only one previous partner, their previous partner might have had multiple partners. If so, you are at high risk of being exposed to an STD. You and your sex partner should be the only sex partners with each other, with no one else involved.   A vaccine is available for hepatitis B and HPV through your caregiver or the Public Health Department. Everyone should be vaccinated with these vaccines.   Avoid risky sex practices. Sex acts that can break the skin make you more likely to get an STD.  SEEK MEDICAL CARE IF:   If you think you have an STD, even if you do not have any symptoms. Contact your caregiver for evaluation and treatment, if needed.   You think or know your sex partner has acquired an STD.   You have any of the symptoms mentioned above.  Document Released: 09/25/2004 Document Revised: 08/07/2011 Document Reviewed: 07/18/2009 [facility] Patient Information 2012 [place], [place].
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
## 2011-11-21 LAB — WET PREP, GENITAL
Clue Cells Wet Prep HPF POC: NONE SEEN
Trich, Wet Prep: NONE SEEN
Yeast Wet Prep HPF POC: NONE SEEN

## 2011-11-21 LAB — GC/CHLAMYDIA PROBE AMP, GENITAL
Chlamydia, DNA Probe: NEGATIVE
GC Probe Amp, Genital: NEGATIVE

## 2011-11-21 LAB — RPR

## 2011-12-02 ENCOUNTER — Telehealth: Payer: Self-pay | Admitting: *Deleted

## 2011-12-02 NOTE — Telephone Encounter (Signed)
Pt left message requesting test results.  I returned her call and left a message to call back with information of when she can be reached. *Note: pt can be told her test results were all negative; RPR, Hep B, Hep C, GC/Chlamydia and wet prep.  HIV was not done as she had it done in another MD's office.

## 2011-12-03 NOTE — Telephone Encounter (Signed)
Pt left new message yesterday and asked that we call the following number with her test result information : [phone]. She stated that we could leave detailed information on the voice mail.  I called the number and left message of results information of all tests performed in our office on 11/20/11.

## 2011-12-18 ENCOUNTER — Ambulatory Visit (INDEPENDENT_AMBULATORY_CARE_PROVIDER_SITE_OTHER): Payer: Medicaid Other | Admitting: Obstetrics and Gynecology

## 2011-12-18 ENCOUNTER — Encounter: Payer: Self-pay | Admitting: Obstetrics and Gynecology

## 2011-12-18 VITALS — BP 121/80 | HR 97 | Temp 96.9°F | Ht 67.0 in | Wt 168.0 lb

## 2011-12-18 DIAGNOSIS — N949 Unspecified condition associated with female genital organs and menstrual cycle: Secondary | ICD-10-CM

## 2011-12-18 DIAGNOSIS — R102 Pelvic and perineal pain: Secondary | ICD-10-CM

## 2011-12-18 MED ORDER — TRAMADOL HCL 50 MG PO TABS: 50.0000 mg | ORAL_TABLET | Freq: Four times a day (QID) | ORAL | Status: DC | PRN

## 2011-12-18 NOTE — Patient Instructions (Signed)
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
## 2011-12-19 ENCOUNTER — Ambulatory Visit (HOSPITAL_COMMUNITY)
Admission: RE | Admit: 2011-12-19 | Discharge: 2011-12-19 | Disposition: A | Discharge status: Home / Self Care | Payer: Medicaid Other | Source: Ambulatory Visit | Attending: Obstetrics and Gynecology | Admitting: Obstetrics and Gynecology

## 2011-12-19 DIAGNOSIS — N949 Unspecified condition associated with female genital organs and menstrual cycle: Secondary | ICD-10-CM | POA: Insufficient documentation

## 2011-12-19 DIAGNOSIS — R1032 Left lower quadrant pain: Secondary | ICD-10-CM | POA: Insufficient documentation

## 2011-12-19 DIAGNOSIS — R102 Pelvic and perineal pain: Secondary | ICD-10-CM

## 2011-12-30 ENCOUNTER — Emergency Department (HOSPITAL_COMMUNITY)
Admission: EM | Admit: 2011-12-30 | Discharge: 2011-12-30 | Disposition: A | Discharge status: Home / Self Care | Payer: Medicaid Other | Attending: Emergency Medicine | Admitting: Emergency Medicine

## 2011-12-30 ENCOUNTER — Encounter (HOSPITAL_COMMUNITY): Payer: Self-pay | Admitting: *Deleted

## 2011-12-30 ENCOUNTER — Emergency Department (HOSPITAL_COMMUNITY): Payer: Medicaid Other

## 2011-12-30 DIAGNOSIS — M436 Torticollis: Secondary | ICD-10-CM | POA: Insufficient documentation

## 2011-12-30 DIAGNOSIS — R209 Unspecified disturbances of skin sensation: Secondary | ICD-10-CM | POA: Insufficient documentation

## 2011-12-30 DIAGNOSIS — R42 Dizziness and giddiness: Secondary | ICD-10-CM | POA: Insufficient documentation

## 2011-12-30 DIAGNOSIS — R51 Headache: Secondary | ICD-10-CM | POA: Insufficient documentation

## 2011-12-30 DIAGNOSIS — J020 Streptococcal pharyngitis: Secondary | ICD-10-CM

## 2011-12-30 DIAGNOSIS — E039 Hypothyroidism, unspecified: Secondary | ICD-10-CM | POA: Insufficient documentation

## 2011-12-30 DIAGNOSIS — R509 Fever, unspecified: Secondary | ICD-10-CM | POA: Insufficient documentation

## 2011-12-30 LAB — BASIC METABOLIC PANEL
BUN: 9 mg/dL (ref 6–23)
CO2: 26 mEq/L (ref 19–32)
Calcium: 9 mg/dL (ref 8.4–10.5)
Chloride: 103 mEq/L (ref 96–112)
Creatinine, Ser: 0.43 mg/dL — ABNORMAL LOW (ref 0.50–1.10)
GFR calc Af Amer: >90 mL/min (ref >90)
GFR calc non Af Amer: >90 mL/min (ref >90)
Glucose, Bld: 98 mg/dL (ref 70–99)
Potassium: 3.9 mEq/L (ref 3.5–5.1)
Sodium: 139 mEq/L (ref 135–145)

## 2011-12-30 LAB — CSF CELL COUNT WITH DIFFERENTIAL
RBC Count, CSF: 48 /mm3 — ABNORMAL HIGH
RBC Count, CSF: 3 /mm3 — ABNORMAL HIGH
Tube #: 1
Tube #: 4
WBC, CSF: 3 /mm3 (ref 0–5)
WBC, CSF: 2 /mm3 (ref 0–5)

## 2011-12-30 LAB — CBC
HCT: 41 % (ref 36.0–46.0)
Hemoglobin: 14 g/dL (ref 12.0–15.0)
MCH: 30.8 pg (ref 26.0–34.0)
MCHC: 34.1 g/dL (ref 30.0–36.0)
MCV: 90.3 fL (ref 78.0–100.0)
Platelets: 239 10*3/uL (ref 150–400)
RBC: 4.54 MIL/uL (ref 3.87–5.11)
RDW: 11.9 % (ref 11.5–15.5)
WBC: 9.5 10*3/uL (ref 4.0–10.5)

## 2011-12-30 LAB — PROTIME-INR
INR: 0.91 (ref 0.00–1.49)
Prothrombin Time: 12.4 seconds (ref 11.6–15.2)

## 2011-12-30 LAB — GRAM STAIN: Special Requests: NORMAL

## 2011-12-30 LAB — PROTEIN AND GLUCOSE, CSF
Glucose, CSF: 64 mg/dL (ref 43–76)
Total  Protein, CSF: 44 mg/dL (ref 15–45)

## 2011-12-30 LAB — DIFFERENTIAL
Basophils Absolute: 0 10*3/uL (ref 0.0–0.1)
Basophils Relative: 0 % (ref 0–1)
Eosinophils Absolute: 0.2 10*3/uL (ref 0.0–0.7)
Eosinophils Relative: 2 % (ref 0–5)
Lymphocytes Relative: 15 % (ref 12–46)
Lymphs Abs: 1.4 10*3/uL (ref 0.7–4.0)
Monocytes Absolute: 0.6 10*3/uL (ref 0.1–1.0)
Monocytes Relative: 6 % (ref 3–12)
Neutro Abs: 7.3 10*3/uL (ref 1.7–7.7)
Neutrophils Relative %: 77 % (ref 43–77)

## 2011-12-30 LAB — RAPID STREP SCREEN (MED CTR MEBANE ONLY): Streptococcus, Group A Screen (Direct): POSITIVE — AB

## 2011-12-30 LAB — MONONUCLEOSIS SCREEN: Mono Screen: NEGATIVE

## 2011-12-30 MED ORDER — DIPHENHYDRAMINE HCL 50 MG/ML IJ SOLN
25.0000 mg | Freq: Once | INTRAMUSCULAR | Status: AC
  Administered 2011-12-30: 17:00:00 via INTRAVENOUS
  Filled 2011-12-30: qty 1

## 2011-12-30 MED ORDER — MORPHINE SULFATE 4 MG/ML IJ SOLN
4.0000 mg | Freq: Once | INTRAMUSCULAR | Status: AC
  Administered 2011-12-30: 4 mg via INTRAVENOUS
  Filled 2011-12-30: qty 1

## 2011-12-30 MED ORDER — LORAZEPAM 2 MG/ML IJ SOLN
1.0000 mg | Freq: Once | INTRAMUSCULAR | Status: AC
  Administered 2011-12-30: 2 mg via INTRAVENOUS
  Filled 2011-12-30: qty 1

## 2011-12-30 MED ORDER — SODIUM CHLORIDE 0.9 % IV BOLUS (SEPSIS)
1000.0000 mL | Freq: Once | INTRAVENOUS | Status: AC
  Administered 2011-12-30: 1000 mL via INTRAVENOUS

## 2011-12-30 MED ORDER — ONDANSETRON HCL 4 MG/2ML IJ SOLN
4.0000 mg | Freq: Once | INTRAMUSCULAR | Status: AC
  Administered 2011-12-30: 4 mg via INTRAVENOUS
  Filled 2011-12-30: qty 2

## 2011-12-30 MED ORDER — HYDROCODONE-ACETAMINOPHEN 5-325 MG PO TABS: 2.0000 | ORAL_TABLET | ORAL | Status: DC | PRN

## 2011-12-30 MED ORDER — PENICILLIN G BENZATHINE 1200000 UNIT/2ML IM SUSP
1.2000 10*6.[IU] | Freq: Once | INTRAMUSCULAR | Status: AC
  Administered 2011-12-30: 1.2 10*6.[IU] via INTRAMUSCULAR
  Filled 2011-12-30: qty 2

## 2011-12-30 MED ORDER — LIDOCAINE HCL 1 % IJ SOLN
INTRAMUSCULAR | Status: AC
  Administered 2011-12-30: 4 mL
  Filled 2011-12-30: qty 20

## 2011-12-30 MED ORDER — DEXAMETHASONE SODIUM PHOSPHATE 10 MG/ML IJ SOLN
10.0000 mg | Freq: Once | INTRAMUSCULAR | Status: AC
  Administered 2011-12-30: 10 mg via INTRAVENOUS
  Filled 2011-12-30: qty 1

## 2011-12-30 MED ORDER — MORPHINE SULFATE 4 MG/ML IJ SOLN
4.0000 mg | Freq: Once | INTRAMUSCULAR | Status: AC
  Administered 2011-12-30: 4 mg via INTRAVENOUS
  Filled 2011-12-30 (×2): qty 1

## 2011-12-30 NOTE — ED Provider Notes (Signed)
History     CSN: [id number]  Arrival date & time 12/30/11  1507   First MD Initiated Contact with Patient 12/30/11 1552      Chief Complaint  Patient presents with  . Torticollis    r/o Meningitis,from PMD office  . Headache    (Consider location/radiation/quality/duration/timing/severity/associated sxs/prior treatment) HPI Comments: Patient was sent to the ED by her PCP for possible Meningitis.  Patient reports that she has had a headache for the past week and neck pain/stiffness for the past 3 days.  She reports that she has never had a headache like this before.  She describes the headache as a throbbing pain and reports that it is located all over her head.  She denies any acute injury or trauma.  She reports that she had a fever of 101 five days ago, but no fever since that time.  She reports some photophobia associated with the headache.  She reports that she has had some nausea and diarrhea over the past few days.  Denies vomiting.  She denies rash. She is also complaining of some numbness of all of her fingers on both of her hands.    The history is provided by the patient.    Past Medical History  Diagnosis Date  . Migraine, unspecified, without mention of intractable migraine without mention of status migrainosus   . Vomiting alone   . Irritable bowel syndrome   . Esophageal reflux   . Diarrhea   . Depressive disorder, not elsewhere classified   . Panic disorder without agoraphobia   . Hypothyroid   . Depression     Past Surgical History  Procedure Date  . Wisdom tooth extraction   . Appendectomy   . Cholecystectomy 08/19/2011    Procedure: LAPAROSCOPIC CHOLECYSTECTOMY WITH INTRAOPERATIVE CHOLANGIOGRAM;  Surgeon: Velora Heckler, MD;  Location: [facility];  Service: General;  Laterality: N/A;  . Colon surgery     Family History  Problem Relation Age of Onset  . Diabetes Father   . Melanoma Father   . Colon polyps Father   . Colon cancer Neg Hx     History    Substance Use Topics  . Smoking status: Current Some Day Smoker -- 0.5 packs/day for 5 years    Types: Cigarettes  . Smokeless tobacco: Never Used  . Alcohol Use: Yes     socially    OB History    Grav Para Term Preterm Abortions TAB SAB Ect Mult Living   3    3 1 2    0      Review of Systems  Constitutional: Positive for fever. Negative for chills and diaphoresis.  HENT: Positive for sore throat, neck pain and neck stiffness. Negative for drooling, trouble swallowing and voice change.   Eyes: Positive for photophobia. Negative for visual disturbance.  Respiratory: Negative for cough and shortness of breath.   Gastrointestinal: Positive for nausea and diarrhea. Negative for vomiting and abdominal pain.  Genitourinary: Negative for dysuria.  Musculoskeletal: Negative for gait problem.  Skin: Negative for rash.  Neurological: Positive for dizziness, weakness, light-headedness, numbness and headaches. Negative for syncope.  Psychiatric/Behavioral: Negative for confusion.    Allergies  Dust mite extract and Procaine hcl  Home Medications   Current Outpatient Rx  Name Route Sig Dispense Refill  . ACYCLOVIR 800 MG PO TABS Oral Take 800 mg by mouth daily as needed. For fever blisters.    . B COMPLEX PO TABS Oral Take 1 tablet by mouth  daily.    [name] CLONAZEPAM 2 MG PO TABS Oral Take 2 mg by mouth 3 (three) times daily as needed. For anxiety. Takes a half to one tablet my by mouth as needed    . ESOMEPRAZOLE MAGNESIUM 40 MG PO CPDR Oral Take 40 mg by mouth daily as needed. For stomach upset.    [name] GREEN TEA 250 MG PO CAPS Oral Take 1 capsule by mouth daily.     [name] HYOSCYAMINE SULFATE ER 0.375 MG PO CP12 Oral Take 0.375 mg by mouth 2 (two) times daily as needed. For stomach pain. Takes one to two tablet by mouth .    [name] LEVOTHYROXINE SODIUM 50 MCG PO TABS Oral Take 50 mcg by mouth daily.     [name] METOCLOPRAMIDE HCL 10 MG PO TABS Oral Take 10 mg by mouth at bedtime and may repeat dose one  time if needed. For cramping.    . ADULT GUMMY PO CHEW Oral Chew 1 tablet by mouth.      . SERTRALINE HCL 100 MG PO TABS Oral Take 50 mg by mouth daily.     . TRAMADOL HCL 50 MG PO TABS Oral Take 1 tablet (50 mg total) by mouth every 6 (six) hours as needed. Maximum dose= 8 tablets per day-FOR PAIN 30 tablet 0    BP 114/67  Pulse 85  Temp(Src) 98.4 F (36.9 C) (Oral)  Resp 20  SpO2 96%  LMP 12/30/2011  Physical Exam  Nursing note and vitals reviewed. Constitutional: She is oriented to person, place, and time. She appears well-developed and well-nourished.       Uncomfortable appearing  HENT:  Head: Normocephalic and atraumatic.  Mouth/Throat: Uvula is midline and mucous membranes are normal. Oropharyngeal exudate present. No posterior oropharyngeal edema or tonsillar abscesses.       Bilateral tonsillar exudate  Eyes: Pupils are equal, round, and reactive to light.  Neck: No spinous process tenderness and no muscular tenderness present. Rigidity present. Decreased range of motion present. No edema and no erythema present.       Cervical paraspinal tenderness  Cardiovascular: Normal rate, regular rhythm and normal heart sounds.   Pulmonary/Chest: Effort normal and breath sounds normal. No respiratory distress. She has no wheezes. She has no rales.  Abdominal: Soft. Bowel sounds are normal.  Neurological: She is alert and oriented to person, place, and time. No cranial nerve deficit. She displays a negative Romberg sign. Gait normal.  Reflex Scores:      Brachioradialis reflexes are 3+ on the right side and 3+ on the left side.      Patellar reflexes are 3+ on the right side and 3+ on the left side.      Achilles reflexes are 3+ on the right side and 3+ on the left side. Skin: Skin is warm and dry. No rash noted.  Psychiatric: She has a normal mood and affect.    ED Course  Procedures (including critical care time)   Labs Reviewed  CBC  DIFFERENTIAL  BASIC METABOLIC PANEL    Ct Head Wo Contrast  12/30/2011  *RADIOLOGY REPORT*  Clinical Data: Torticollis.  Meningitis.  Headache.  CT HEAD WITHOUT CONTRAST  Technique:  Contiguous axial images were obtained from the base of the skull through the vertex without contrast.  Comparison: CT head without contrast 07/17/2011.  Findings: No acute intracranial abnormality is present. Specifically, there is no evidence for acute infarct, hemorrhage, mass, hydrocephalus, or extra-axial fluid collection.  The paranasal sinuses and  mastoid air cells are clear.  The globes and orbits are intact.  The osseous skull is intact.  IMPRESSION: Negative CT of the head.  Original Report Authenticated By: Jamesetta Orleans. MATTERN, M.D.   Dg Fluoro Guide Lumbar Puncture  12/30/2011  *RADIOLOGY REPORT*  Clinical Data:  Headache  FLUOROSCOPIC GUIDED LUMBAR PUNCTURE.  Technique: Informed written and verbal consent were obtained. Risks and benefits of the procedure including bleeding, infection, and headache were discussed.  A "time out" was performed.  The patient was then placed prone, minimally obliqued on the fluoroscopic table.  The L3-L4 level was localized.  Skin was prepped and draped in a standard sterile fashion.  Skin and subcutaneous tissues were numbed with 1% lidocaine.  A 9 cm spinal needle was inserted into the thecal sac without difficulty.  The patient tolerated the procedure without immediate complications.  Comparison: Head CT same date  Fluoroscopy time:  49 seconds.  Findings:  Opening pressure of 19 cm of clear CSF.  A total of 8 cc of CSF were obtained.  IMPRESSION:  Uncomplicated fluoroscopic guided lumbar puncture as described.  Original Report Authenticated By: Consuello Bossier, M.D.     No diagnosis found.  Discussed patient with Dr. Ranae Palms who also evaluated patient.   Lumbar puncture attempted by Dr Ranae Palms but was unsuccessful.  Fluoroscopic guided lumbar puncture will be performed.    MDM  Patient sent to the ED  today by her PCP for evaluation of possible meningitis.  Pt presenting with headache for the past week and neck stiffness over the past several days along with a sore throat.  LP was performed which was negative for Meningitis.  CT head also negative.  Strep test positive for strep.  Mono spot negative for Mono.  Patient given Bicillin IM while in the ED and discharged home.  Return precautions discussed.        Pascal Lux [place], PA-C 01/01/12 [phone]

## 2011-12-30 NOTE — ED Notes (Signed)
PA at bedside.

## 2011-12-30 NOTE — ED Notes (Signed)
Patient transported to CT 

## 2011-12-30 NOTE — ED Notes (Signed)
Pt states she started to have a headache, neck pain, and generalized body aches since last Tuesday. Pt states she has had a fever off and on. Pt is having light sensitivity.

## 2011-12-30 NOTE — ED Notes (Signed)
Back returned from lumbar puncture.

## 2011-12-30 NOTE — ED Notes (Signed)
Patient not in room.will obtain labs upon patient's return 

## 2011-12-30 NOTE — Discharge Instructions (Signed)

## 2011-12-30 NOTE — Procedures (Signed)
Clinical Data:  Headache Fluoroscopic guided lumbar puncture.  Technique: Informed written and verbal consent were obtained.  Risks and benefits of the procedure including bleeding, infection, and headache were discussed.  A "time out" was performed.  The patient was then placed prone, minimally obliqued on the fluoroscopic table.  The L3-L4 level was localized.  Skin was prepped and draped in a standard sterile fashion.  Skin and subcutaneous tissues were numbed with 1% lidocaine.  A [...]cm spinal needle was inserted into the thecal sac without difficulty.  The patient tolerated the procedure without immediate complications. Comparison: Head CT same date Findings:  Opening pressure of 19 cm of clear CSF.  A total of 8 cc of CSF were obtained. IMPRESSION: Uncomplicated fluoroscopic guided lumbar puncture as described.

## 2011-12-30 NOTE — ED Notes (Signed)
Pt states she gave a verbal consent before procedure.  Pt was explain the procedure. Pt np at bedside was unsuccessful. Pt has been explained the following process

## 2011-12-30 NOTE — ED Notes (Signed)
Patient transported to X-ray 

## 2011-12-30 NOTE — ED Notes (Signed)
PA at pt bedside

## 2011-12-30 NOTE — ED Notes (Signed)
MD at bedside. 

## 2011-12-31 ENCOUNTER — Encounter (HOSPITAL_COMMUNITY): Payer: Self-pay | Admitting: Emergency Medicine

## 2011-12-31 ENCOUNTER — Emergency Department (HOSPITAL_COMMUNITY)
Admission: EM | Admit: 2011-12-31 | Discharge: 2011-12-31 | Disposition: A | Discharge status: Home / Self Care | Payer: Medicaid Other | Attending: Emergency Medicine | Admitting: Emergency Medicine

## 2011-12-31 DIAGNOSIS — F329 Major depressive disorder, single episode, unspecified: Secondary | ICD-10-CM | POA: Insufficient documentation

## 2011-12-31 DIAGNOSIS — R509 Fever, unspecified: Secondary | ICD-10-CM | POA: Insufficient documentation

## 2011-12-31 DIAGNOSIS — K219 Gastro-esophageal reflux disease without esophagitis: Secondary | ICD-10-CM | POA: Insufficient documentation

## 2011-12-31 DIAGNOSIS — IMO0001 Reserved for inherently not codable concepts without codable children: Secondary | ICD-10-CM | POA: Insufficient documentation

## 2011-12-31 DIAGNOSIS — H53149 Visual discomfort, unspecified: Secondary | ICD-10-CM | POA: Insufficient documentation

## 2011-12-31 DIAGNOSIS — E039 Hypothyroidism, unspecified: Secondary | ICD-10-CM | POA: Insufficient documentation

## 2011-12-31 DIAGNOSIS — F3289 Other specified depressive episodes: Secondary | ICD-10-CM | POA: Insufficient documentation

## 2011-12-31 DIAGNOSIS — M542 Cervicalgia: Secondary | ICD-10-CM | POA: Insufficient documentation

## 2011-12-31 DIAGNOSIS — Z79899 Other long term (current) drug therapy: Secondary | ICD-10-CM | POA: Insufficient documentation

## 2011-12-31 DIAGNOSIS — F17200 Nicotine dependence, unspecified, uncomplicated: Secondary | ICD-10-CM | POA: Insufficient documentation

## 2011-12-31 DIAGNOSIS — R51 Headache: Secondary | ICD-10-CM | POA: Insufficient documentation

## 2011-12-31 DIAGNOSIS — Z9889 Other specified postprocedural states: Secondary | ICD-10-CM | POA: Insufficient documentation

## 2011-12-31 LAB — POCT I-STAT, CHEM 8
BUN: 8 mg/dL (ref 6–23)
Calcium, Ion: 1.15 mmol/L (ref 1.12–1.32)
Chloride: 108 mEq/L (ref 96–112)
Creatinine, Ser: 0.6 mg/dL (ref 0.50–1.10)
Glucose, Bld: 142 mg/dL — ABNORMAL HIGH (ref 70–99)
HCT: 34 % — ABNORMAL LOW (ref 36.0–46.0)
Hemoglobin: 11.6 g/dL — ABNORMAL LOW (ref 12.0–15.0)
Potassium: 4.1 mEq/L (ref 3.5–5.1)
Sodium: 140 mEq/L (ref 135–145)
TCO2: 21 mmol/L (ref 0–100)

## 2011-12-31 MED ORDER — SODIUM CHLORIDE 0.9 % IV BOLUS (SEPSIS)
1000.0000 mL | Freq: Once | INTRAVENOUS | Status: AC
  Administered 2011-12-31: 1000 mL via INTRAVENOUS

## 2011-12-31 MED ORDER — HYDROMORPHONE HCL PF 1 MG/ML IJ SOLN
1.0000 mg | Freq: Once | INTRAMUSCULAR | Status: AC
  Administered 2011-12-31: 1 mg via INTRAVENOUS
  Filled 2011-12-31: qty 1

## 2011-12-31 MED ORDER — OXYCODONE-ACETAMINOPHEN 5-325 MG PO TABS: 1.0000 | ORAL_TABLET | Freq: Four times a day (QID) | ORAL | Status: AC | PRN

## 2011-12-31 MED ORDER — ONDANSETRON 8 MG PO TBDP: 8.0000 mg | ORAL_TABLET | Freq: Three times a day (TID) | ORAL | Status: AC | PRN

## 2011-12-31 MED ORDER — METOCLOPRAMIDE HCL 5 MG/ML IJ SOLN
10.0000 mg | Freq: Once | INTRAMUSCULAR | Status: AC
  Administered 2011-12-31: 10 mg via INTRAVENOUS
  Filled 2011-12-31: qty 2

## 2011-12-31 MED ORDER — DIPHENHYDRAMINE HCL 50 MG/ML IJ SOLN
25.0000 mg | Freq: Once | INTRAMUSCULAR | Status: AC
  Administered 2011-12-31: 25 mg via INTRAVENOUS
  Filled 2011-12-31: qty 1

## 2011-12-31 NOTE — ED Provider Notes (Addendum)
History    CSN: [id number] Arrival date & time 12/31/11  1114  First MD Initiated Contact with Patient 12/31/11 1122     Chief Complaint  Patient presents with  . Headache    HPI Patient presents to the emergency room with complaints of persistent headache. She was seen in emergent or last night after having trouble with headache neck pain and body aches since last Tuesday. She had been having light sensitivity and fever as well. Patient states the headache was severe in the back of her head. She did not have any nausea or vomiting. Patient was seen in the emergency department last night and had a spinal tap. The results of which were reportedly normal. The patient was sent home with medications for pain. Patient continued to have a severe headache this morning. She states she was unable to sleep. She saw her primary Dr. and was told to come to the emergent for a blood patch. Patient states she's also having pain now in the back where the lumbar puncture was performed. Past Medical History  Diagnosis Date  . Migraine, unspecified, without mention of intractable migraine without mention of status migrainosus   . Vomiting alone   . Irritable bowel syndrome   . Esophageal reflux   . Diarrhea   . Depressive disorder, not elsewhere classified   . Panic disorder without agoraphobia   . Hypothyroid   . Depression     Past Surgical History  Procedure Date  . Wisdom tooth extraction   . Appendectomy   . Cholecystectomy 08/19/2011    Procedure: LAPAROSCOPIC CHOLECYSTECTOMY WITH INTRAOPERATIVE CHOLANGIOGRAM;  Surgeon: Velora Heckler, MD;  Location: [facility];  Service: General;  Laterality: N/A;  . Colon surgery     Family History  Problem Relation Age of Onset  . Diabetes Father   . Melanoma Father   . Colon polyps Father   . Colon cancer Neg Hx     History  Substance Use Topics  . Smoking status: Current Some Day Smoker -- 0.5 packs/day for 5 years    Types: Cigarettes  . Smokeless  tobacco: Never Used  . Alcohol Use: Yes     socially    OB History    Grav Para Term Preterm Abortions TAB SAB Ect Mult Living   3    3 1 2    0      Review of Systems  All other systems reviewed and are negative.    Allergies  Dust mite extract and Procaine hcl  Home Medications   Current Outpatient Rx  Name Route Sig Dispense Refill  . ACYCLOVIR 800 MG PO TABS Oral Take 800 mg by mouth daily as needed. For fever blisters.    . B COMPLEX PO TABS Oral Take 1 tablet by mouth daily.    [name] CLONAZEPAM 2 MG PO TABS Oral Take 2 mg by mouth 3 (three) times daily as needed. For anxiety. Takes a half to one tablet my by mouth as needed    . ESOMEPRAZOLE MAGNESIUM 40 MG PO CPDR Oral Take 40 mg by mouth daily as needed. For stomach upset.    [name] GREEN TEA 250 MG PO CAPS Oral Take 1 capsule by mouth daily.     [name] HYDROCODONE-ACETAMINOPHEN 5-325 MG PO TABS Oral Take 2 tablets by mouth every 4 (four) hours as needed for pain. 6 tablet 0  . HYOSCYAMINE SULFATE ER 0.375 MG PO CP12 Oral Take 0.375 mg by mouth 2 (two) times daily as  needed. For stomach pain. Takes one to two tablet by mouth .    [name] LEVOTHYROXINE SODIUM 50 MCG PO TABS Oral Take 50 mcg by mouth daily.     [name] METOCLOPRAMIDE HCL 10 MG PO TABS Oral Take 10 mg by mouth at bedtime and may repeat dose one time if needed. For cramping.    . ADULT GUMMY PO CHEW Oral Chew 1 tablet by mouth.      . SERTRALINE HCL 100 MG PO TABS Oral Take 50 mg by mouth daily.     . TRAMADOL HCL 50 MG PO TABS Oral Take 1 tablet (50 mg total) by mouth every 6 (six) hours as needed. Maximum dose= 8 tablets per day-FOR PAIN 30 tablet 0    BP 104/75  Pulse 70  Temp(Src) 97.4 F (36.3 C) (Oral)  Resp 16  SpO2 98%  LMP 12/30/2011  Physical Exam  Nursing note and vitals reviewed. Constitutional: She appears well-developed and well-nourished. No distress.       Lying in a dark room with sunglasses on  HENT:  Head: Normocephalic and atraumatic.  Right Ear:  External ear normal.  Left Ear: External ear normal.  Eyes: Conjunctivae are normal. Right eye exhibits no discharge. Left eye exhibits no discharge. No scleral icterus.  Neck: Neck supple. No tracheal deviation present.       Paraspinal muscle tenderness  Cardiovascular: Normal rate, regular rhythm and intact distal pulses.   Pulmonary/Chest: Effort normal and breath sounds normal. No stridor. No respiratory distress. She has no wheezes. She has no rales.  Abdominal: Soft. Bowel sounds are normal. She exhibits no distension. There is no tenderness. There is no rebound and no guarding.  Musculoskeletal: She exhibits no edema and no tenderness.  Neurological: She is alert. She has normal strength. No sensory deficit. Cranial nerve deficit:  no gross defecits noted. She exhibits normal muscle tone. She displays no seizure activity. Coordination normal.  Skin: Skin is warm and dry. No rash noted.  Psychiatric: She has a normal mood and affect.    ED Course  Procedures (including critical care time) Results for orders placed during the hospital encounter of 12/31/11  POCT I-STAT, CHEM 8      Component Value Range   Sodium 140  135 - 145 (mEq/L)   Potassium 4.1  3.5 - 5.1 (mEq/L)   Chloride 108  96 - 112 (mEq/L)   BUN 8  6 - 23 (mg/dL)   Creatinine, Ser 0.34  0.50 - 1.10 (mg/dL)   Glucose, Bld 742 (*) 70 - 99 (mg/dL)   Calcium, Ion 5.95  6.38 - 1.32 (mmol/L)   TCO2 21  0 - 100 (mmol/L)   Hemoglobin 11.6 (*) 12.0 - 15.0 (g/dL)   HCT 75.6 (*) 43.3 - 46.0 (%)    Labs Reviewed - No data to display Ct Head Wo Contrast  12/30/2011  *RADIOLOGY REPORT*  Clinical Data: Torticollis.  Meningitis.  Headache.  CT HEAD WITHOUT CONTRAST  Technique:  Contiguous axial images were obtained from the base of the skull through the vertex without contrast.  Comparison: CT head without contrast 07/17/2011.  Findings: No acute intracranial abnormality is present. Specifically, there is no evidence for acute  infarct, hemorrhage, mass, hydrocephalus, or extra-axial fluid collection.  The paranasal sinuses and mastoid air cells are clear.  The globes and orbits are intact.  The osseous skull is intact.  IMPRESSION: Negative CT of the head.  Original Report Authenticated By: Jamesetta Orleans. MATTERN, M.D.  Dg Fluoro Guide Lumbar Puncture  12/30/2011  *RADIOLOGY REPORT*  Clinical Data:  Headache  FLUOROSCOPIC GUIDED LUMBAR PUNCTURE.  Technique: Informed written and verbal consent were obtained. Risks and benefits of the procedure including bleeding, infection, and headache were discussed.  A "time out" was performed.  The patient was then placed prone, minimally obliqued on the fluoroscopic table.  The L3-L4 level was localized.  Skin was prepped and draped in a standard sterile fashion.  Skin and subcutaneous tissues were numbed with 1% lidocaine.  A 9 cm spinal needle was inserted into the thecal sac without difficulty.  The patient tolerated the procedure without immediate complications.  Comparison: Head CT same date  Fluoroscopy time:  49 seconds.  Findings:  Opening pressure of 19 cm of clear CSF.  A total of 8 cc of CSF were obtained.  IMPRESSION:  Uncomplicated fluoroscopic guided lumbar puncture as described.  Original Report Authenticated By: Consuello Bossier, M.D.      MDM  I spoke with radiology, and Dr. Reche Dixon.  They do not recommend blood patch until greater than 48 hours after the procedure. I suspect that the patient has persistent headache which is what she presented for the first place.  The symptoms are not suggestive of a spinal headache. We'll treat the patient with medications for pain and nausea.   12:51 PM the patient is feeling better. I suspect she is having a migraine headache. I reviewed the results from her spinal tap previously and there is no growth from the culture.  Celene Kras, MD 12/31/11 [phone]

## 2011-12-31 NOTE — ED Notes (Signed)
MD at bedside. 

## 2011-12-31 NOTE — Discharge Instructions (Signed)

## 2011-12-31 NOTE — ED Notes (Signed)
Pt states she was here yesterday and had a spinal tap for meningitis (which was negative).  Pt says she is sent here by her doc for a blood patch.

## 2011-12-31 NOTE — ED Notes (Signed)
Discharged with mother.

## 2012-01-03 ENCOUNTER — Encounter (HOSPITAL_COMMUNITY): Payer: Self-pay | Admitting: Emergency Medicine

## 2012-01-03 ENCOUNTER — Emergency Department (HOSPITAL_COMMUNITY)
Admission: EM | Admit: 2012-01-03 | Discharge: 2012-01-03 | Disposition: A | Discharge status: Home / Self Care | Payer: Medicaid Other | Attending: Emergency Medicine | Admitting: Emergency Medicine

## 2012-01-03 ENCOUNTER — Emergency Department (HOSPITAL_COMMUNITY): Payer: Medicaid Other

## 2012-01-03 DIAGNOSIS — H53149 Visual discomfort, unspecified: Secondary | ICD-10-CM | POA: Insufficient documentation

## 2012-01-03 DIAGNOSIS — R42 Dizziness and giddiness: Secondary | ICD-10-CM | POA: Insufficient documentation

## 2012-01-03 DIAGNOSIS — R51 Headache: Secondary | ICD-10-CM | POA: Insufficient documentation

## 2012-01-03 DIAGNOSIS — E039 Hypothyroidism, unspecified: Secondary | ICD-10-CM | POA: Insufficient documentation

## 2012-01-03 DIAGNOSIS — R112 Nausea with vomiting, unspecified: Secondary | ICD-10-CM | POA: Insufficient documentation

## 2012-01-03 DIAGNOSIS — E860 Dehydration: Secondary | ICD-10-CM

## 2012-01-03 DIAGNOSIS — Z9889 Other specified postprocedural states: Secondary | ICD-10-CM | POA: Insufficient documentation

## 2012-01-03 LAB — POCT I-STAT, CHEM 8
BUN: 5 mg/dL — ABNORMAL LOW (ref 6–23)
Calcium, Ion: 1.18 mmol/L (ref 1.12–1.32)
Chloride: 105 mEq/L (ref 96–112)
Creatinine, Ser: 0.7 mg/dL (ref 0.50–1.10)
Glucose, Bld: 109 mg/dL — ABNORMAL HIGH (ref 70–99)
HCT: 38 % (ref 36.0–46.0)
Hemoglobin: 12.9 g/dL (ref 12.0–15.0)
Potassium: 4.2 mEq/L (ref 3.5–5.1)
Sodium: 142 mEq/L (ref 135–145)
TCO2: 27 mmol/L (ref 0–100)

## 2012-01-03 LAB — CSF CULTURE W GRAM STAIN
Culture: NO GROWTH
Gram Stain: NONE SEEN
Special Requests: NORMAL

## 2012-01-03 MED ORDER — DIPHENHYDRAMINE HCL 50 MG/ML IJ SOLN
25.0000 mg | Freq: Once | INTRAMUSCULAR | Status: AC
  Administered 2012-01-03: 25 mg via INTRAVENOUS
  Filled 2012-01-03: qty 1

## 2012-01-03 MED ORDER — METOCLOPRAMIDE HCL 5 MG/ML IJ SOLN
10.0000 mg | Freq: Once | INTRAMUSCULAR | Status: AC
Start: 2012-01-03 — End: 2012-01-03
  Administered 2012-01-03: 10 mg via INTRAVENOUS
  Filled 2012-01-03: qty 2

## 2012-01-03 MED ORDER — IOHEXOL 300 MG/ML  SOLN: 2.0000 mL | Freq: Once | INTRAMUSCULAR | Status: DC | PRN

## 2012-01-03 MED ORDER — HYDROMORPHONE HCL PF 1 MG/ML IJ SOLN
1.0000 mg | Freq: Once | INTRAMUSCULAR | Status: AC
  Administered 2012-01-03: 1 mg via INTRAVENOUS
  Filled 2012-01-03: qty 1

## 2012-01-03 MED ORDER — SODIUM CHLORIDE 0.9 % IV BOLUS (SEPSIS)
1000.0000 mL | Freq: Once | INTRAVENOUS | Status: AC
  Administered 2012-01-03: 1000 mL via INTRAVENOUS

## 2012-01-03 MED ORDER — DEXAMETHASONE SODIUM PHOSPHATE 10 MG/ML IJ SOLN
10.0000 mg | Freq: Once | INTRAMUSCULAR | Status: AC
Start: 2012-01-03 — End: 2012-01-03
  Administered 2012-01-03: 10 mg via INTRAVENOUS
  Filled 2012-01-03: qty 1

## 2012-01-03 MED ORDER — METOCLOPRAMIDE HCL 5 MG/ML IJ SOLN
5.0000 mg | Freq: Once | INTRAMUSCULAR | Status: AC
  Administered 2012-01-03: 5 mg via INTRAVENOUS
  Filled 2012-01-03: qty 2

## 2012-01-03 MED ORDER — DIPHENHYDRAMINE HCL 25 MG PO CAPS
25.0000 mg | ORAL_CAPSULE | Freq: Once | ORAL | Status: AC
  Administered 2012-01-03: 25 mg via ORAL
  Filled 2012-01-03: qty 1

## 2012-01-03 NOTE — ED Provider Notes (Addendum)
History     CSN: [id number]  Arrival date & time 01/03/12  1204   First MD Initiated Contact with Patient 01/03/12 1212      Chief Complaint  Patient presents with  . Headache    (Consider location/radiation/quality/duration/timing/severity/associated sxs/prior treatment) HPI  Presents with headache and dizziness x > 1 week. Describes headache as "everywhere", pulsating, +photo/phonophobia. Describes as 10/10. Worse with standing and sitting up. She continues to have neck stiffness which has improved. +Nausea +NBNB emesis x8 in the past 24 hours. Dec PO intake and unable to tolerate. Denies trauma. Seen here previously for same x 2. Her first visit with a lumbar puncture which was unremarkable for infection or subarachnoid hemorrhage. She did have +Strep at that time which was treated with bicillin. CSF Cultures are negative. Second visit 2 days ago, she came in with advice of her PMD to obtain blood patch. She was not 48 hours post procedure at that time. She states she continues to have severe headache, the only new aspect is that it is worse with standing.  She does have h/o headaches but no formal diagnosis of migraines. Those headaches usually relieved with tramadol. Min relief 2 days ago after dilaudid. Denies etoh, drugs   ED Notes, ED Provider Notes from 01/03/12 0000 to 01/03/12 12:11:56       Cardell Peach, RN 01/03/2012 12:09      Pt presenting to ed with c/o headache pain and dizziness pt states she was seen here for the same x 3 times this week and pain is worse. Pt states she was told to come back to ed is symptoms persist because she may need a blood patch. Pt is alert and oriented at this time. Pt states positive nausea and vomiting    Past Medical History  Diagnosis Date  . Migraine, unspecified, without mention of intractable migraine without mention of status migrainosus   . Vomiting alone   . Irritable bowel syndrome   . Esophageal reflux   . Diarrhea   . Depressive  disorder, not elsewhere classified   . Panic disorder without agoraphobia   . Hypothyroid   . Depression     Past Surgical History  Procedure Date  . Wisdom tooth extraction   . Appendectomy   . Cholecystectomy 08/19/2011    Procedure: LAPAROSCOPIC CHOLECYSTECTOMY WITH INTRAOPERATIVE CHOLANGIOGRAM;  Surgeon: Velora Heckler, MD;  Location: [facility];  Service: General;  Laterality: N/A;  . Colon surgery     Family History  Problem Relation Age of Onset  . Diabetes Father   . Melanoma Father   . Colon polyps Father   . Colon cancer Neg Hx     History  Substance Use Topics  . Smoking status: Current Some Day Smoker -- 0.5 packs/day for 5 years    Types: Cigarettes  . Smokeless tobacco: Never Used  . Alcohol Use: No     socially    OB History    Grav Para Term Preterm Abortions TAB SAB Ect Mult Living   3    3 1 2    0      Review of Systems  All other systems reviewed and are negative.  except as noted HPI   Allergies  Dust mite extract and Procaine hcl  Home Medications   Current Outpatient Rx  Name Route Sig Dispense Refill  . ACYCLOVIR 800 MG PO TABS Oral Take 800 mg by mouth daily as needed. For fever blisters.    . B COMPLEX  PO TABS Oral Take 1 tablet by mouth daily.    [name] CLONAZEPAM 2 MG PO TABS Oral Take 1-2 mg by mouth 3 (three) times daily as needed. For anxiety.    . ESOMEPRAZOLE MAGNESIUM 40 MG PO CPDR Oral Take 40 mg by mouth daily as needed. For stomach upset.    [name] GREEN TEA 250 MG PO CAPS Oral Take 1 capsule by mouth daily.     [name] HYOSCYAMINE SULFATE ER 0.375 MG PO CP12 Oral Take 0.375 mg by mouth 2 (two) times daily as needed. For stomach pain. Takes one to two tablet by mouth .    [name] LEVOTHYROXINE SODIUM 50 MCG PO TABS Oral Take 50 mcg by mouth daily.     . ADULT GUMMY PO CHEW Oral Chew 1 tablet by mouth daily.     [name] ONDANSETRON 8 MG PO TBDP Oral Take 1 tablet (8 mg total) by mouth every 8 (eight) hours as needed for nausea. 12 tablet 0  .  OXYCODONE-ACETAMINOPHEN 5-325 MG PO TABS Oral Take 1-2 tablets by mouth every 6 (six) hours as needed for pain. 20 tablet 0  . SERTRALINE HCL 100 MG PO TABS Oral Take 50 mg by mouth daily.     . TRAMADOL HCL 50 MG PO TABS Oral Take 1 tablet (50 mg total) by mouth every 6 (six) hours as needed. Maximum dose= 8 tablets per day-FOR PAIN 30 tablet 0    BP 108/73  Pulse 92  Temp(Src) 98.2 F (36.8 C) (Oral)  Resp 20  SpO2 96%  LMP 12/30/2011  Physical Exam  Nursing note and vitals reviewed. Constitutional: She is oriented to person, place, and time. She appears well-developed.  HENT:  Head: Atraumatic.       Mm dry  Eyes: Conjunctivae and EOM are normal. Pupils are equal, round, and reactive to light.       +photophobia  Neck: Normal range of motion. Neck supple.       No meningismus  Cardiovascular: Normal rate, regular rhythm, normal heart sounds and intact distal pulses.   Pulmonary/Chest: Effort normal and breath sounds normal. No respiratory distress. She has no wheezes. She has no rales.  Abdominal: Soft. She exhibits no distension. There is no tenderness. There is no rebound and no guarding.  Musculoskeletal: Normal range of motion.  Neurological: She is alert and oriented to person, place, and time. No cranial nerve deficit. She exhibits normal muscle tone. Coordination normal.       Strength 5/5 all extremities     Skin: Skin is warm and dry. No rash noted.  Psychiatric: She has a normal mood and affect.    ED Course  Procedures (including critical care time)  Labs Reviewed - No data to display No results found.   1. Headache   2. Dehydration     MDM  Persistent headache, worse with standing. Meningitis, SAH ruled out previously. She appears mildly dehydrated. Discussed case with Dr. Oley Balm of Interventional Radiology who will perform blood patch for her today. Pain control in the meantime. IVF, reglan, benadryl, decadron, dilaudid  ordered.        Forbes Cellar, MD 01/03/12 1320  Forbes Cellar, MD 01/03/12 1320

## 2012-01-03 NOTE — ED Notes (Signed)
Pt presenting to ed with c/o headache pain and dizziness pt states she was seen here for the same x 3 times this week and pain is worse. Pt states she was told to come back to ed is symptoms persist because she may need a blood patch. Pt is alert and oriented at this time. Pt states positive nausea and vomiting

## 2012-01-03 NOTE — ED Provider Notes (Signed)
Patient given pain medication here feels better. Neurological exam at time of discharge stable.  Toy Baker, MD 01/03/12 [phone]

## 2012-01-03 NOTE — ED Notes (Signed)
AVW:[id number]<XB> Expected date:<BR> Expected time:<BR> Means of arrival:<BR> Comments:<BR> Pt in radiology

## 2012-01-03 NOTE — Procedures (Signed)
Blood patch Left  L3-4 20ml No complication No blood loss. See complete dictation in [facility].

## 2012-01-03 NOTE — Discharge Instructions (Signed)
RESOURCE GUIDE  Dental Problems  Patients with Medicaid: [facility] [phone]                                           [phone] [address] Phone:  [phone]                                                   Phone:  [phone]  If unable to pay or uninsured, contact:  [facility] or [facility]. to become qualified for the adult dental clinic.  Chronic Pain Problems Contact [facility]  [phone] Patients need to be referred by their primary care doctor.  Insufficient Money for Medicine Contact [employer]:  call "211" or [facility] [phone].  No Primary Care Doctor Call [facility]  [phone] Other agencies that provide inexpensive medical care    [facility]  [phone]    [facility]  [phone]    [facility]  [phone]    [facility]  [phone]    [facility]  [phone]    [facility]  [phone]  Psychological Services [facility]  [phone] [facility]  [phone] [facility]   [phone] (emergency services [phone])  Abuse/Neglect [facility] Child Abuse Hotline 620-003-3245 [place] Child Abuse Hotline 716-386-0520 (After Hours)  Emergency Shelter [facility] [phone]  [facility] [place] [facility] [phone] [facility] [phone]  MRSA Hotline #:   601-823-9837    [facility] Resources  [facility]  [employer]                           [facility] [address]                      [address]         [address]  Blondell Reveal Phone:  [phone]                                  Phone:  [phone]                   Phone:   [phone]  [facility] Phone:  [phone]  [facility] Child Abuse Hotline (979)526-6726 [phone] (After Hours)  Epidural Blood Patching in Spinal Headache An epidural blood patch (EBP) is a  procedure used to treat a headache you may get following a spinal tap (epidural). This kind of headache is often called a spinal headache. This headache may also be called a postdural puncture headache (PDPH). An epidural blood patch involves injecting a small amount of your own blood into the space near the epidural puncture site. An epidural blood patch is generally used for treating a person who has received epidural anesthesia and who has symptoms that may include:  A headache that is so debilitating that it interferes with your ability to care for your baby.   A headache that has not been relieved by 2 to 3 days of:   Bed rest.   Drinking lots of fluids, including caffeinated drinks.   Taking oral medications for pain, including nonsteroidal anti-inflammatory agents. Only take over-the-counter or prescription medicines for pain, discomfort, or fever as directed by your caregiver.   Neck pain and stiffness that is very severe and associated with vomiting.   Hearing loss.   Double vision.  An epidural blood patch is not used when:  Your symptoms are due to an infection in the lower back (lumbar) area or the blood.   You have bleeding tendencies.   You are taking certain blood thinning medications.  PROCEDURE  Blood is withdrawn from the arm and injected into the space where the cerebrospinal fluid (CSF) is thought to be leaking. This is the same spot where the epidural anesthetic was injected. When the blood is injected, generally there will be a feeling of tightness in the buttocks, lower back, or thighs. AFTER THE PROCEDURE  You will be expected to lie on your back for 2 to 4 hours with some pillows under your knees. It is important to lie still while on  your back so that a good clot can form. You should avoid any straining, especially right after the procedure. HOME CARE INSTRUCTIONS   Do not carry anything heavier than 15 lb (7 kg) for 2 to 3 weeks.   Get as much bed rest as you can for the first 24 hours after the blood patch.   Squat rather than bend when picking up items in a low position.   Avoid excessive straining. This can cause the patch to blow out and the spinal headache to return.   Continue to drink as much as you can, especially beverages with caffeine in them.   Report any temperature, back pain, or pain radiating down the legs. Report to your caregiver if the headache returns or if you have any difficulty with your bowels or bladder. Also report any other problems not controlled by medications.  Most patients obtain almost instant relief from the spinal headache. In some, the relief comes on gradually over a 24-hour period. Following the EBP some people experience mild backache for a few days. Less than 2% of people also have a mild, passing sensation of prickly or tingly skin (paraesthesiae), neck pain, or nerve-root pain. MAKE SURE YOU:   Understand these instructions.   Will watch your condition.   Will get help right away if you are not doing well or get worse.  Document Released: 02/07/2002 Document Revised: 08/07/2011 Document Reviewed: 12/13/2008 [facility] Patient Information 2012 [place], [place].

## 2012-01-05 ENCOUNTER — Ambulatory Visit: Payer: Self-pay | Admitting: Advanced Practice Midwife

## 2012-01-12 NOTE — ED Provider Notes (Signed)
Medical screening examination/treatment/procedure(s) were conducted as a shared visit with non-physician practitioner(s) and myself.  I personally evaluated the patient during the encounter   Loren Racer, MD 01/12/12 [phone]

## 2012-08-04 ENCOUNTER — Emergency Department (HOSPITAL_COMMUNITY): Payer: Self-pay

## 2012-08-04 ENCOUNTER — Other Ambulatory Visit (HOSPITAL_COMMUNITY): Payer: Medicaid Other

## 2012-08-04 ENCOUNTER — Emergency Department (HOSPITAL_COMMUNITY)
Admission: EM | Admit: 2012-08-04 | Discharge: 2012-08-04 | Disposition: A | Discharge status: Home / Self Care | Payer: Self-pay | Attending: Emergency Medicine | Admitting: Emergency Medicine

## 2012-08-04 ENCOUNTER — Encounter (HOSPITAL_COMMUNITY): Payer: Self-pay

## 2012-08-04 DIAGNOSIS — H53149 Visual discomfort, unspecified: Secondary | ICD-10-CM | POA: Insufficient documentation

## 2012-08-04 DIAGNOSIS — R1110 Vomiting, unspecified: Secondary | ICD-10-CM

## 2012-08-04 DIAGNOSIS — F17200 Nicotine dependence, unspecified, uncomplicated: Secondary | ICD-10-CM | POA: Insufficient documentation

## 2012-08-04 DIAGNOSIS — Z3202 Encounter for pregnancy test, result negative: Secondary | ICD-10-CM | POA: Insufficient documentation

## 2012-08-04 DIAGNOSIS — R112 Nausea with vomiting, unspecified: Secondary | ICD-10-CM | POA: Insufficient documentation

## 2012-08-04 DIAGNOSIS — F329 Major depressive disorder, single episode, unspecified: Secondary | ICD-10-CM | POA: Insufficient documentation

## 2012-08-04 DIAGNOSIS — K5900 Constipation, unspecified: Secondary | ICD-10-CM | POA: Insufficient documentation

## 2012-08-04 DIAGNOSIS — F3289 Other specified depressive episodes: Secondary | ICD-10-CM | POA: Insufficient documentation

## 2012-08-04 DIAGNOSIS — Z79899 Other long term (current) drug therapy: Secondary | ICD-10-CM | POA: Insufficient documentation

## 2012-08-04 DIAGNOSIS — Z8679 Personal history of other diseases of the circulatory system: Secondary | ICD-10-CM | POA: Insufficient documentation

## 2012-08-04 DIAGNOSIS — Z8719 Personal history of other diseases of the digestive system: Secondary | ICD-10-CM | POA: Insufficient documentation

## 2012-08-04 DIAGNOSIS — F4001 Agoraphobia with panic disorder: Secondary | ICD-10-CM | POA: Insufficient documentation

## 2012-08-04 DIAGNOSIS — R109 Unspecified abdominal pain: Secondary | ICD-10-CM | POA: Insufficient documentation

## 2012-08-04 DIAGNOSIS — N390 Urinary tract infection, site not specified: Secondary | ICD-10-CM | POA: Insufficient documentation

## 2012-08-04 DIAGNOSIS — IMO0001 Reserved for inherently not codable concepts without codable children: Secondary | ICD-10-CM | POA: Insufficient documentation

## 2012-08-04 DIAGNOSIS — M545 Low back pain, unspecified: Secondary | ICD-10-CM | POA: Insufficient documentation

## 2012-08-04 DIAGNOSIS — R5381 Other malaise: Secondary | ICD-10-CM | POA: Insufficient documentation

## 2012-08-04 DIAGNOSIS — Z862 Personal history of diseases of the blood and blood-forming organs and certain disorders involving the immune mechanism: Secondary | ICD-10-CM | POA: Insufficient documentation

## 2012-08-04 DIAGNOSIS — Z8639 Personal history of other endocrine, nutritional and metabolic disease: Secondary | ICD-10-CM | POA: Insufficient documentation

## 2012-08-04 DIAGNOSIS — M549 Dorsalgia, unspecified: Secondary | ICD-10-CM | POA: Insufficient documentation

## 2012-08-04 DIAGNOSIS — E860 Dehydration: Secondary | ICD-10-CM | POA: Insufficient documentation

## 2012-08-04 LAB — COMPREHENSIVE METABOLIC PANEL
ALT: 58 U/L — ABNORMAL HIGH (ref 0–35)
AST: 45 U/L — ABNORMAL HIGH (ref 0–37)
Albumin: 3.6 g/dL (ref 3.5–5.2)
Alkaline Phosphatase: 89 U/L (ref 39–117)
BUN: 14 mg/dL (ref 6–23)
CO2: 27 mEq/L (ref 19–32)
Calcium: 9.1 mg/dL (ref 8.4–10.5)
Chloride: 98 mEq/L (ref 96–112)
Creatinine, Ser: 0.66 mg/dL (ref 0.50–1.10)
GFR calc Af Amer: >90 mL/min (ref >90)
GFR calc non Af Amer: >90 mL/min (ref >90)
Glucose, Bld: 110 mg/dL — ABNORMAL HIGH (ref 70–99)
Potassium: 3.8 mEq/L (ref 3.5–5.1)
Sodium: 134 mEq/L — ABNORMAL LOW (ref 135–145)
Total Bilirubin: 0.3 mg/dL (ref 0.3–1.2)
Total Protein: 6.7 g/dL (ref 6.0–8.3)

## 2012-08-04 LAB — URINALYSIS, ROUTINE W REFLEX MICROSCOPIC
Bilirubin Urine: NEGATIVE
Glucose, UA: NEGATIVE mg/dL
Ketones, ur: NEGATIVE mg/dL
Nitrite: NEGATIVE
Protein, ur: NEGATIVE mg/dL
Specific Gravity, Urine: 1.017 (ref 1.005–1.030)
Urobilinogen, UA: 0.2 mg/dL (ref 0.0–1.0)
pH: 5.5 (ref 5.0–8.0)

## 2012-08-04 LAB — CBC WITH DIFFERENTIAL/PLATELET
Basophils Absolute: 0 10*3/uL (ref 0.0–0.1)
Basophils Relative: 0 % (ref 0–1)
Eosinophils Absolute: 0.2 10*3/uL (ref 0.0–0.7)
Eosinophils Relative: 2 % (ref 0–5)
HCT: 40.3 % (ref 36.0–46.0)
Hemoglobin: 13.9 g/dL (ref 12.0–15.0)
Lymphocytes Relative: 26 % (ref 12–46)
Lymphs Abs: 2.3 10*3/uL (ref 0.7–4.0)
MCH: 31 pg (ref 26.0–34.0)
MCHC: 34.5 g/dL (ref 30.0–36.0)
MCV: 89.8 fL (ref 78.0–100.0)
Monocytes Absolute: 0.5 10*3/uL (ref 0.1–1.0)
Monocytes Relative: 6 % (ref 3–12)
Neutro Abs: 5.7 10*3/uL (ref 1.7–7.7)
Neutrophils Relative %: 65 % (ref 43–77)
Platelets: 291 10*3/uL (ref 150–400)
RBC: 4.49 MIL/uL (ref 3.87–5.11)
RDW: 11.6 % (ref 11.5–15.5)
WBC: 8.8 10*3/uL (ref 4.0–10.5)

## 2012-08-04 LAB — URINE MICROSCOPIC-ADD ON

## 2012-08-04 LAB — LIPASE, BLOOD: Lipase: 30 U/L (ref 11–59)

## 2012-08-04 LAB — C-REACTIVE PROTEIN: CRP: 0.5 mg/dL — ABNORMAL LOW (ref <0.60)

## 2012-08-04 LAB — SEDIMENTATION RATE: Sed Rate: 7 mm/hr (ref 0–22)

## 2012-08-04 LAB — PREGNANCY, URINE: Preg Test, Ur: NEGATIVE

## 2012-08-04 MED ORDER — ONDANSETRON HCL 4 MG/2ML IJ SOLN
4.0000 mg | Freq: Once | INTRAMUSCULAR | Status: AC
  Administered 2012-08-04: 4 mg via INTRAVENOUS
  Filled 2012-08-04: qty 2

## 2012-08-04 MED ORDER — SODIUM CHLORIDE 0.9 % IV BOLUS (SEPSIS)
1000.0000 mL | Freq: Once | INTRAVENOUS | Status: AC
  Administered 2012-08-04: 1000 mL via INTRAVENOUS

## 2012-08-04 MED ORDER — MORPHINE SULFATE 4 MG/ML IJ SOLN
4.0000 mg | Freq: Once | INTRAMUSCULAR | Status: AC
  Administered 2012-08-04: 4 mg via INTRAVENOUS
  Filled 2012-08-04: qty 1

## 2012-08-04 MED ORDER — MORPHINE SULFATE 2 MG/ML IJ SOLN
2.0000 mg | Freq: Once | INTRAMUSCULAR | Status: AC
  Administered 2012-08-04: 2 mg via INTRAVENOUS
  Filled 2012-08-04: qty 1

## 2012-08-04 MED ORDER — LORAZEPAM 2 MG/ML IJ SOLN: 1.0000 mg | Freq: Once | INTRAMUSCULAR | Status: DC

## 2012-08-04 MED ORDER — ONDANSETRON HCL 4 MG PO TABS: 4.0000 mg | ORAL_TABLET | Freq: Four times a day (QID) | ORAL | Status: DC

## 2012-08-04 MED ORDER — ONDANSETRON 8 MG PO TBDP
8.0000 mg | ORAL_TABLET | Freq: Once | ORAL | Status: AC
  Administered 2012-08-04: 8 mg via ORAL
  Filled 2012-08-04: qty 1

## 2012-08-04 MED ORDER — METHOCARBAMOL 500 MG PO TABS: 500.0000 mg | ORAL_TABLET | Freq: Two times a day (BID) | ORAL | Status: DC

## 2012-08-04 MED ORDER — LORAZEPAM 2 MG/ML IJ SOLN
1.0000 mg | Freq: Once | INTRAMUSCULAR | Status: AC
  Administered 2012-08-04: 1 mg via INTRAVENOUS
  Filled 2012-08-04: qty 1

## 2012-08-04 MED ORDER — NITROFURANTOIN MONOHYD MACRO 100 MG PO CAPS: 100.0000 mg | ORAL_CAPSULE | Freq: Two times a day (BID) | ORAL | Status: DC

## 2012-08-04 MED ORDER — POLYETHYLENE GLYCOL 3350 17 GM/SCOOP PO POWD: 17.0000 g | Freq: Every day | ORAL | Status: DC

## 2012-08-04 NOTE — ED Notes (Signed)
Pt urinated again

## 2012-08-04 NOTE — ED Provider Notes (Signed)
History     CSN: [id number]  Arrival date & time 08/04/12  0805   First MD Initiated Contact with Patient 08/04/12 0831      Chief Complaint  Patient presents with  . Emesis    (Consider location/radiation/quality/duration/timing/severity/associated sxs/prior treatment) HPI Pt has had N/V x 5 days. States was was feeling better yesterday and ate chicken for dinner and has had 8-9 episodes of vomiting since. She now c/o abd distention and diffuse pain as well as bl lower back pain. She states she has not voided in 2-3 days and not had BM in 3-4 days. No fever or chills. No focal weakness or numbness. No vaginal D/C or bleeding. LMP 11/14.  Past Medical History  Diagnosis Date  . Migraine, unspecified, without mention of intractable migraine without mention of status migrainosus   . Vomiting alone   . Irritable bowel syndrome   . Esophageal reflux   . Diarrhea   . Depressive disorder, not elsewhere classified   . Panic disorder without agoraphobia   . Hypothyroid   . Depression     Past Surgical History  Procedure Date  . Wisdom tooth extraction   . Appendectomy   . Cholecystectomy 08/19/2011    Procedure: LAPAROSCOPIC CHOLECYSTECTOMY WITH INTRAOPERATIVE CHOLANGIOGRAM;  Surgeon: Velora Heckler, MD;  Location: [facility];  Service: General;  Laterality: N/A;  . Colon surgery     Family History  Problem Relation Age of Onset  . Diabetes Father   . Melanoma Father   . Colon polyps Father   . Colon cancer Neg Hx     History  Substance Use Topics  . Smoking status: Current Some Day Smoker -- 0.5 packs/day for 5 years    Types: Cigarettes  . Smokeless tobacco: Never Used  . Alcohol Use: No     Comment: socially    OB History    Grav Para Term Preterm Abortions TAB SAB Ect Mult Living   3    3 1 2    0      Review of Systems  Constitutional: Positive for appetite change and fatigue. Negative for fever and chills.  HENT: Negative for neck pain and neck stiffness.    Eyes: Positive for photophobia.  Respiratory: Negative for shortness of breath.   Cardiovascular: Negative for chest pain, palpitations and leg swelling.  Gastrointestinal: Positive for nausea, vomiting, abdominal pain, constipation and abdominal distention. Negative for diarrhea.  Genitourinary: Positive for flank pain and difficulty urinating. Negative for frequency, hematuria, vaginal bleeding, vaginal discharge, enuresis and pelvic pain.  Musculoskeletal: Positive for myalgias and back pain.  Skin: Negative for pallor, rash and wound.  Neurological: Negative for dizziness, weakness, light-headedness and numbness.    Allergies  Dust mite extract and Procaine hcl  Home Medications   Current Outpatient Rx  Name  Route  Sig  Dispense  Refill  . ACYCLOVIR 800 MG PO TABS   Oral   Take 800 mg by mouth daily as needed. For fever blisters.         [name] CLONAZEPAM 2 MG PO TABS   Oral   Take 1-2 mg by mouth 3 (three) times daily as needed. For anxiety.         [name] PROMETHAZINE HCL 25 MG PO TABS   Oral   Take 25 mg by mouth every 6 (six) hours as needed. nausea         . SERTRALINE HCL 100 MG PO TABS   Oral   Take 50  mg by mouth daily.          [name] METHOCARBAMOL 500 MG PO TABS   Oral   Take 1 tablet (500 mg total) by mouth 2 (two) times daily.   20 tablet   0   . NITROFURANTOIN MONOHYD MACRO 100 MG PO CAPS   Oral   Take 1 capsule (100 mg total) by mouth 2 (two) times daily.   10 capsule   0   . ONDANSETRON HCL 4 MG PO TABS   Oral   Take 1 tablet (4 mg total) by mouth every 6 (six) hours.   12 tablet   0   . POLYETHYLENE GLYCOL 3350 PO POWD   Oral   Take 17 g by mouth daily.   255 g   0   . PROMETHAZINE HCL 25 MG PO TABS   Oral   Take 1 tablet (25 mg total) by mouth every 6 (six) hours as needed for nausea.   30 tablet   1   . TRAMADOL HCL 50 MG PO TABS   Oral   Take 1 tablet (50 mg total) by mouth every 6 (six) hours as needed. Maximum dose= 8 tablets per  day-FOR PAIN   30 tablet   0     BP 108/65  Pulse 67  Temp 97.8 F (36.6 C) (Oral)  Resp 16  SpO2 96%  LMP 07/14/2012  Physical Exam  Nursing note and vitals reviewed. Constitutional: She is oriented to person, place, and time. She appears well-developed. No distress.  HENT:  Head: Normocephalic and atraumatic.  Mouth/Throat: Oropharynx is clear and moist.  Eyes: EOM are normal. Pupils are equal, round, and reactive to light.       Pupils 4mm bl and reactive  Neck: Normal range of motion. Neck supple.  Cardiovascular: Normal rate and regular rhythm.   Pulmonary/Chest: Effort normal and breath sounds normal. No respiratory distress. She has no wheezes. She has no rales.  Abdominal: Soft. Bowel sounds are normal. She exhibits distension (mildly distended). She exhibits no mass. There is tenderness (Diffuse TTP though appears more pronouced in LLQ. No rebound or guarding). There is no rebound and no guarding.  Genitourinary:       Normal anal tone  Musculoskeletal: Normal range of motion. She exhibits tenderness (TTP bl flanks, lumbar paraspinal muscles and midline lumbar spine). She exhibits no edema.  Neurological: She is alert and oriented to person, place, and time.       5/5 motor in all ext, sensation intact, hyperactive DTR's No saddle anesthesia  Skin: Skin is warm and dry. No rash noted. No erythema.    ED Course  Procedures (including critical care time)  Labs Reviewed  COMPREHENSIVE METABOLIC PANEL - Abnormal; Notable for the following:    Sodium 134 (*)     Glucose, Bld 110 (*)     AST 45 (*)     ALT 58 (*)     All other components within normal limits  URINALYSIS, ROUTINE W REFLEX MICROSCOPIC - Abnormal; Notable for the following:    APPearance CLOUDY (*)     Hgb urine dipstick LARGE (*)     Leukocytes, UA LARGE (*)     All other components within normal limits  C-REACTIVE PROTEIN - Abnormal; Notable for the following:    CRP 0.5 (*)     All other  components within normal limits  URINE MICROSCOPIC-ADD ON - Abnormal; Notable for the following:    Squamous Epithelial /  LPF FEW (*)     All other components within normal limits  CBC WITH DIFFERENTIAL  LIPASE, BLOOD  PREGNANCY, URINE  SEDIMENTATION RATE  URINE CULTURE  LAB REPORT - SCANNED   No results found.   1. Vomiting   2. Abdominal pain   3. Back pain   4. Dehydration   5. UTI (urinary tract infection)   6. Constipation       MDM   Pt had anxiety attack in MR and was unable to complete. Pt returned ED. She is calm, abd soft, no vomiting. States she is feeling better after fluids though still having lumbar pain. Pt was able to urinate small amount in ED. Bedside US demonstrates small amount of urine in bladder. Pt has expressed that she thinks she will be able to tolerate MR and request second attempt. Spoke to MR staff and will attempt another MR. Alternatively, pt may have to have open MR as outpatient.       Loren Racer, MD 08/07/12 [phone]

## 2012-08-04 NOTE — ED Notes (Signed)
Patient reports that she has been vomiting x 1 week, RLQ pain x 2 days, bilateral lower back pain x 3 days, and body aches. Patient states she has vomited x 8 in the past 24 hours. Patient denies fever.

## 2012-08-04 NOTE — ED Notes (Signed)
Patient transported to MRI 

## 2012-08-04 NOTE — ED Notes (Signed)
Pt unable to do MRI due to anxiety

## 2012-08-04 NOTE — ED Notes (Signed)
PT had episode of shaking in MRI before scan.  MRI staff called ED.  1mg  ativan verbal order given, ativan given in MRI.  Pt was alert/oriented upon giving med.  States she remembers panic attack.

## 2012-08-04 NOTE — ED Provider Notes (Signed)
Signed out at shift change. MRI LS pending to r/o abscess, infection, etc, s/p LP.  ESR, CRP negative.  MRI negative for acute process.  Pt has ambulated with steady gait, tol PO without N/V.  Wants to go home now but is requesting another dose of zofran "before I go."  Will re-medicate.  Dx and testing d/w pt and family.  Questions answered.  Verb understanding, agreeable to d/c home with outpt f/u.     Mr Lumbar Spine Wo Contrast 08/04/2012  *RADIOLOGY REPORT*  Clinical Data: Lumbar pain.  Unable to urinate  MRI LUMBAR SPINE WITHOUT CONTRAST  Technique:  Multiplanar and multiecho pulse sequences of the lumbar spine were obtained without intravenous contrast.  Comparison: None.  Findings: The study extends from T11 to the sacrum.  Normal lumbar alignment.  Negative for fracture.  Schmorl's node in the superior plate of L1.  Negative for mass lesion.  Conus medullaris is normal and terminates at T12.  T12-L1:  Negative for spinal stenosis or disc protrusion.  L1-2:  Negative  L2-3:  Negative  L3-4:  Mild facet hypertrophy.  No significant disc degeneration or stenosis.  L4-5:  Mild facet hypertrophy bilaterally without significant stenosis  L5-S1:  Negative.  IMPRESSION: Mild degenerative changes at T12-L1.  No disc protrusion or stenosis.  No evidence of tumor or infection.   Original Report Authenticated By: Janeece Riggers, M.D.      Laray Anger, DO 08/04/12 [phone]

## 2012-08-05 LAB — URINE CULTURE: Colony Count: >=100000

## 2012-08-10 IMAGING — US US ABDOMEN COMPLETE
1 series · 14 of 25 positions shown · non-contrast
Comparison: 07/26/2011.

CLINICAL DATA: Right upper quadrant abdominal pain, nausea and
vomiting.

COMPLETE ABDOMINAL ULTRASOUND

[Series 1: us abdomen complete · 0.32mm/px · 14 of 85 slices shown]
[im 1/85]
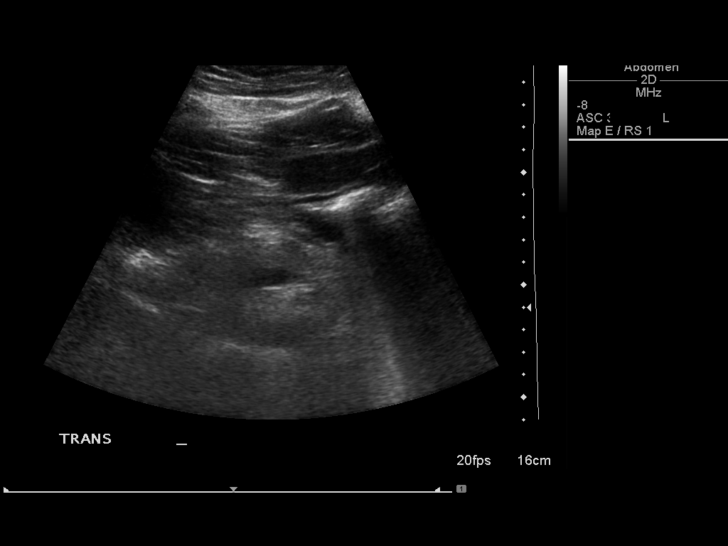
[im 8/85]
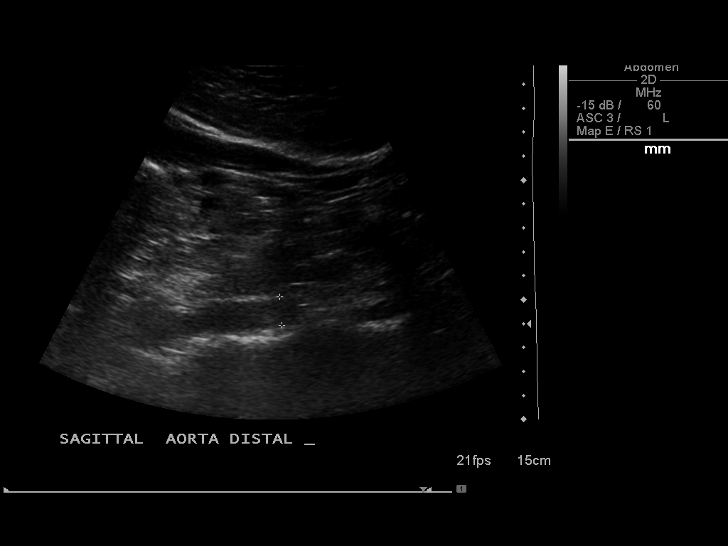
[im 15/85]
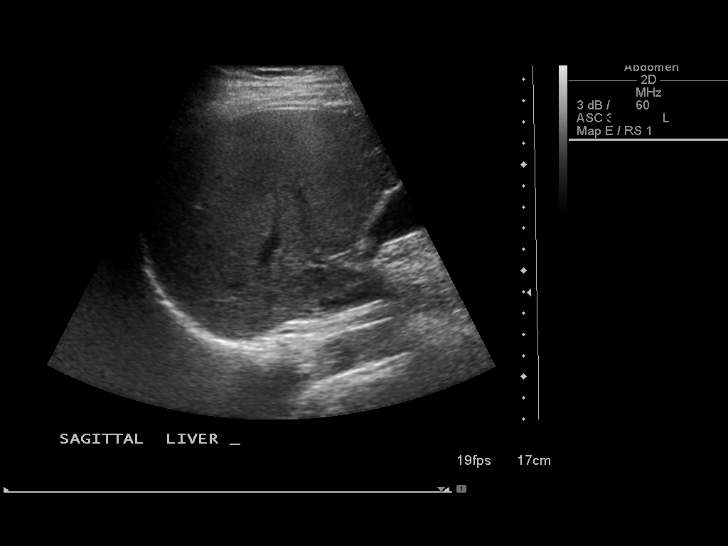
[im 22/85]
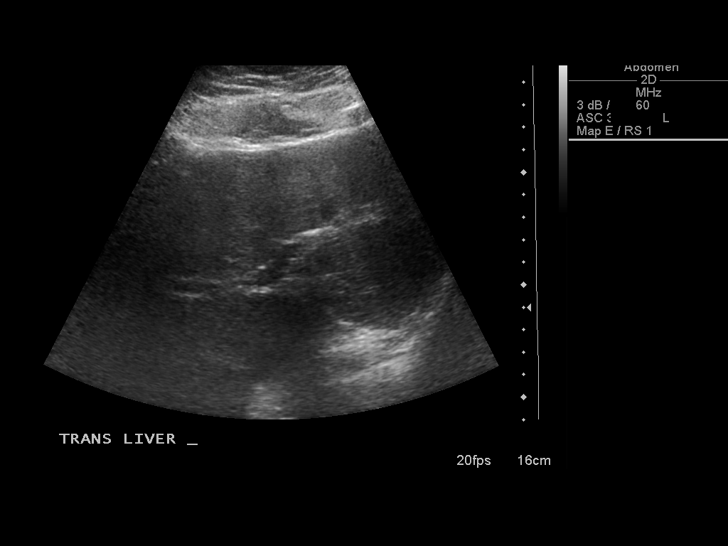
[im 29/85]
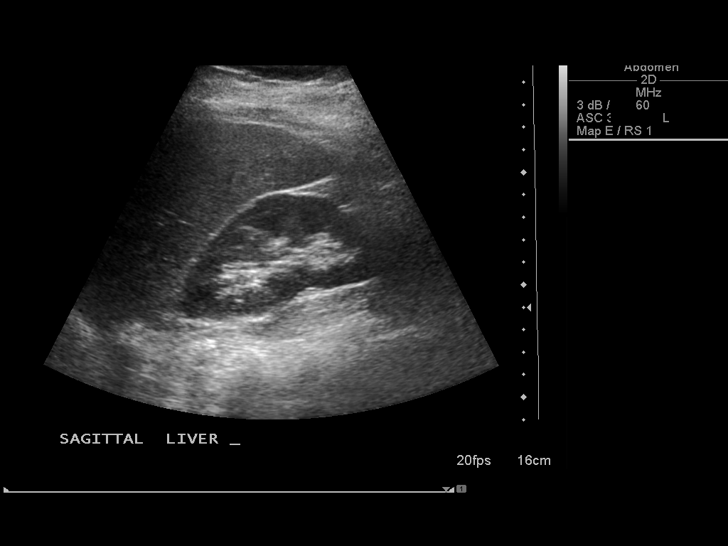
[im 32/85]
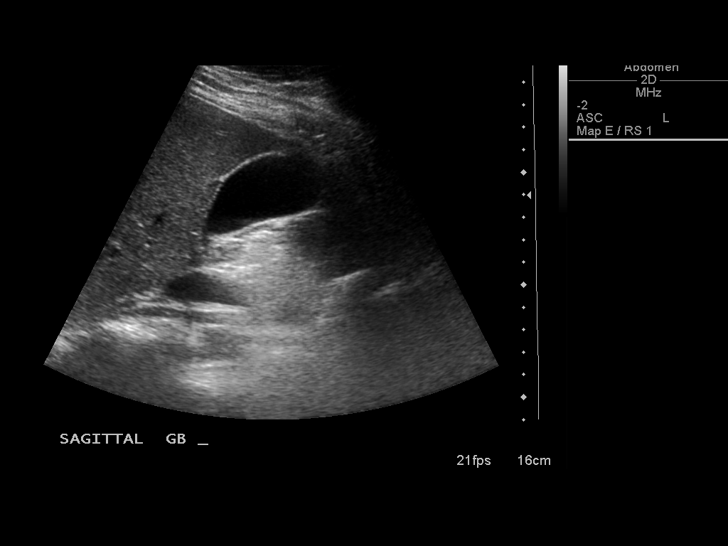
[im 39/85]
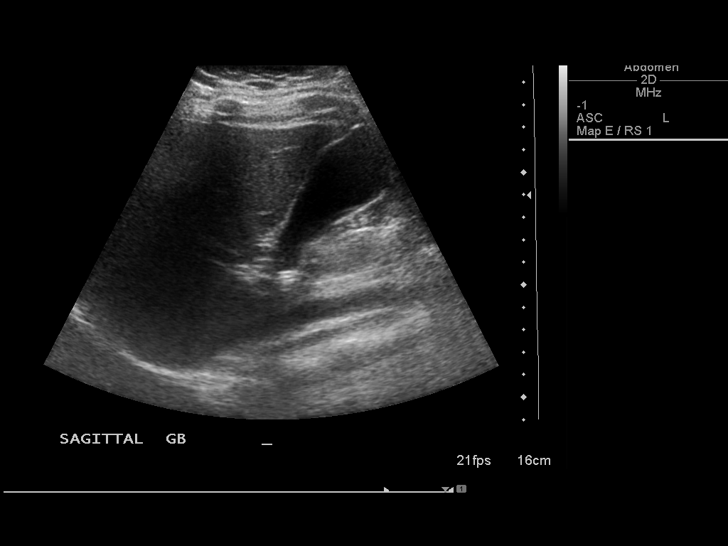
[im 46/85]
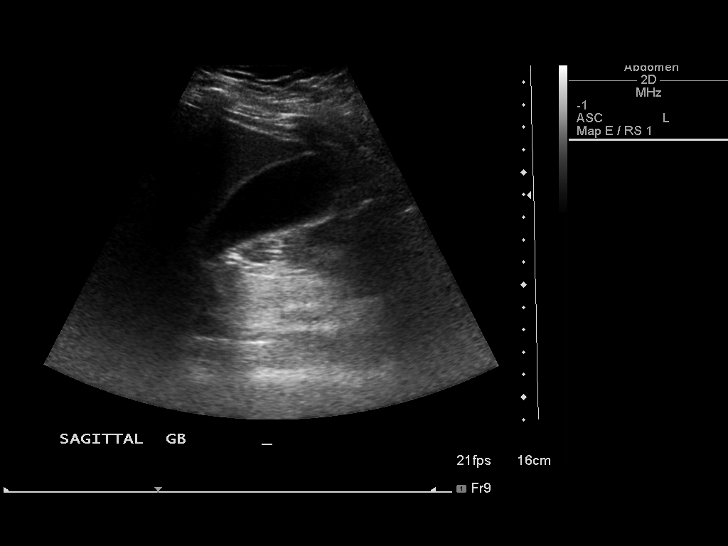
[im 53/85]
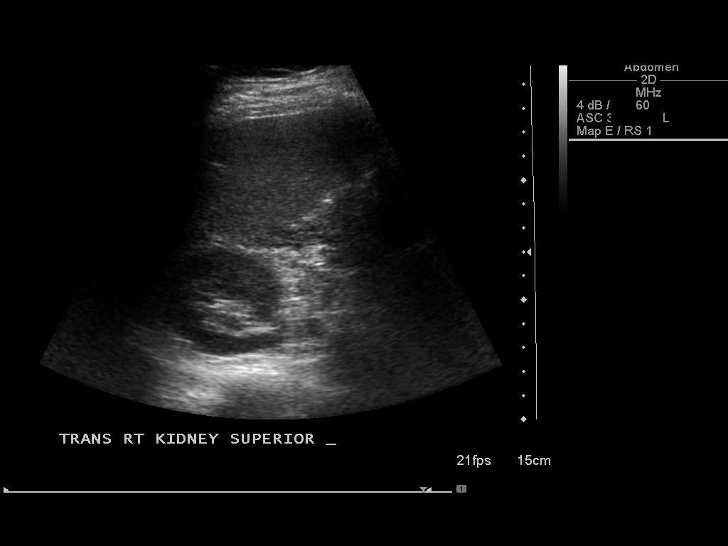
[im 57/85]
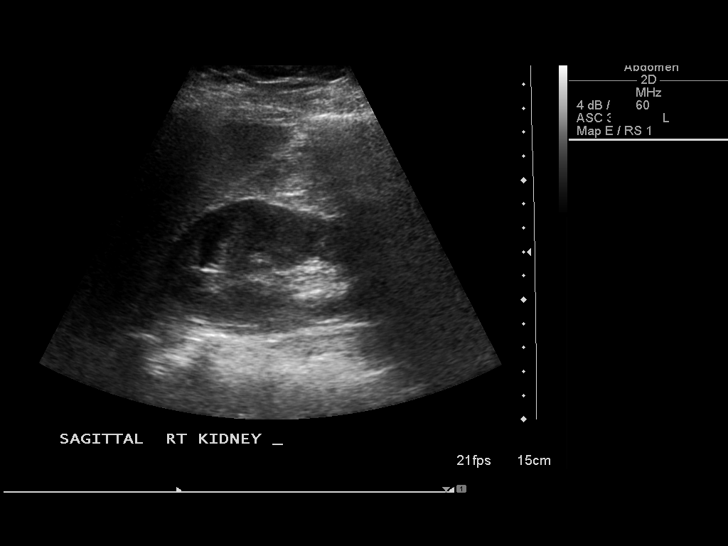
[im 64/85]
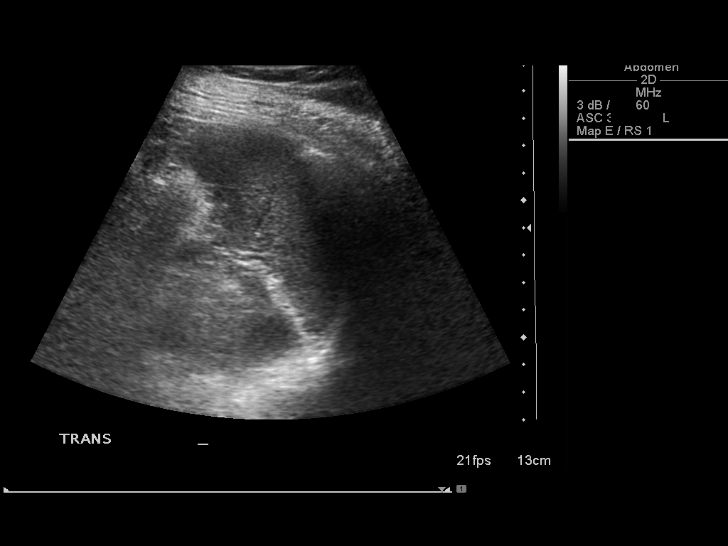
[im 71/85]
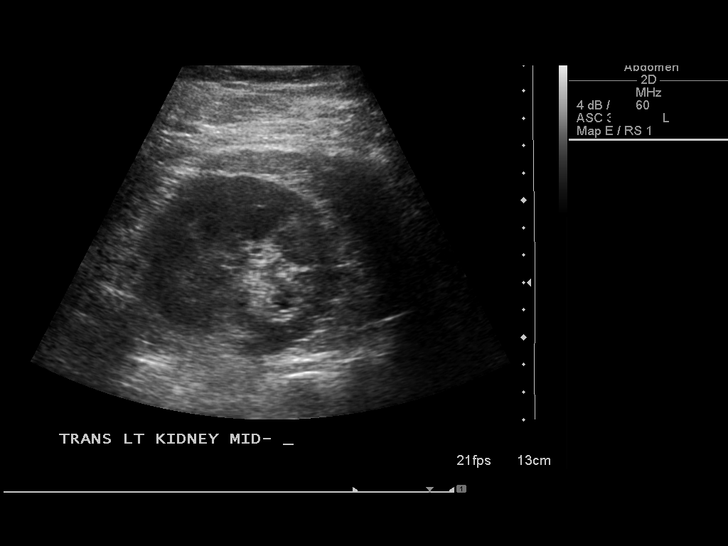
[im 78/85]
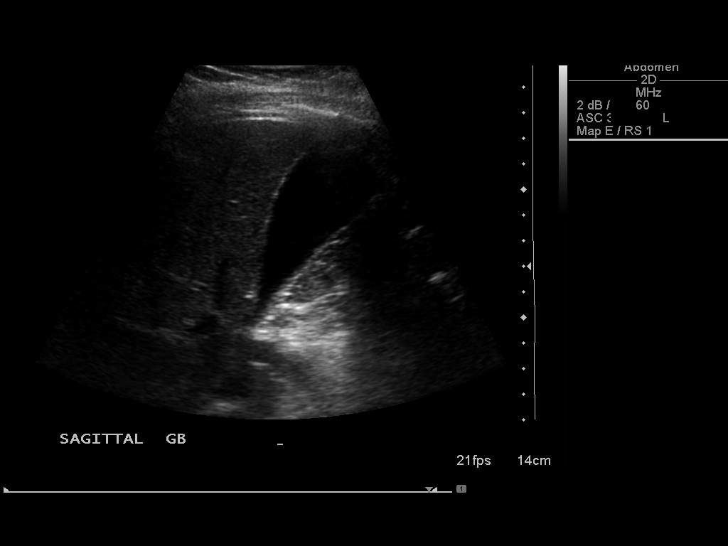
[im 85/85]
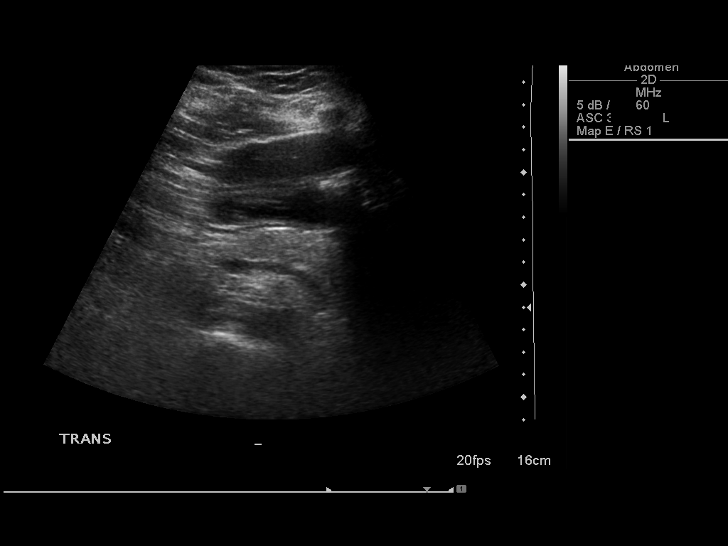

[14 of 25 positions shown; findings below may reference images not displayed]

FINDINGS: Gallbladder:  No gallstones, gallbladder wall thickening, or
pericholecystic fluid.  A previously demonstrated small echogenic
focus adjacent to the gallbladder is significantly smaller.

Common bile duct:  Normal in caliber, measuring 3.5 mm in diameter
proximally.

Liver:  Mildly echogenic, without significant change.

IVC:  Appears normal.

Pancreas:  No focal abnormality seen.

Spleen:  Normal, measuring 6.0 cm in length.

Right Kidney:  Normal, measuring 10.5 cm in length.

Left Kidney:  Normal, measuring 10.3 cm in length.

Abdominal aorta:  No aneurysm identified.
IMPRESSION: Normal examination.

## 2012-08-25 IMAGING — RF DG CHOLANGIOGRAM OPERATIVE
1 series · 1 of 1 positions shown · non-contrast
Comparison: Ultrasound 08/04/2011

CLINICAL DATA: Cholecystectomy.

INTRAOPERATIVE CHOLANGIOGRAM

[Series 1: run · 1 of 1 slices shown]
[im 1/1]
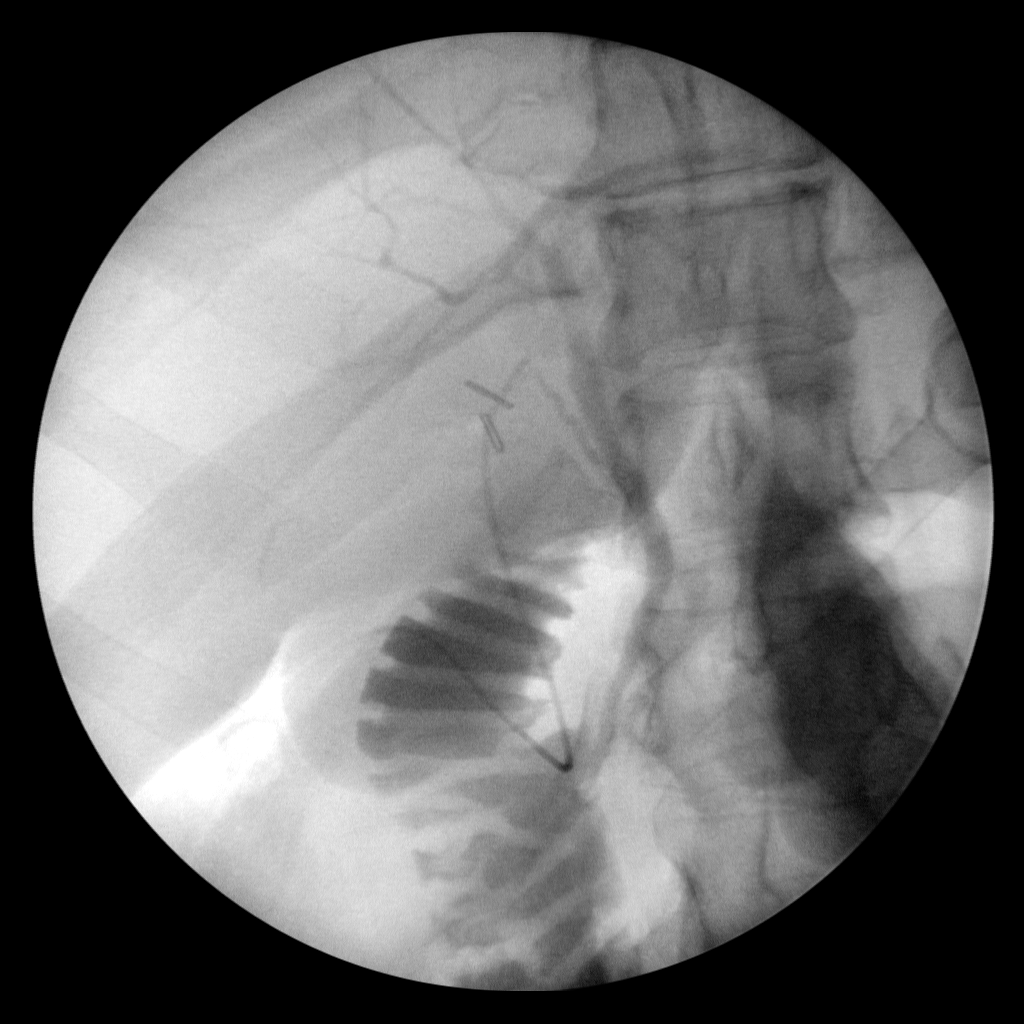

[1 of 1 positions shown; findings below may reference images not displayed]

FINDINGS: Intraoperative spot images show normal caliber biliary
system.  No evidence of retained stone or obstruction.  Free
passage of contrast into the small bowel noted.
IMPRESSION: No evidence of retained stone or obstruction.

These images were submitted for radiologic interpretation only.
Please see the procedural report for the amount of contrast and the
fluoroscopy time utilized.

## 2012-09-01 DIAGNOSIS — A0472 Enterocolitis due to Clostridium difficile, not specified as recurrent: Secondary | ICD-10-CM

## 2012-09-01 HISTORY — DX: Enterocolitis due to Clostridium difficile, not specified as recurrent: A04.72

## 2012-12-25 IMAGING — US US PELVIS COMPLETE
1 series · 14 of 25 positions shown · non-contrast
Comparison: None.

CLINICAL DATA: Left lower quadrant pelvic pain.  LMP 11/11/2011

TRANSABDOMINAL AND TRANSVAGINAL ULTRASOUND OF PELVIS
TECHNIQUE: Both transabdominal and transvaginal ultrasound
examinations of the pelvis were performed. Transabdominal technique
was performed for global imaging of the pelvis including uterus,
ovaries, adnexal regions, and pelvic cul-de-sac.

[Series 1: us pelvis complete · 14 of 52 slices shown]
[im 1/52]
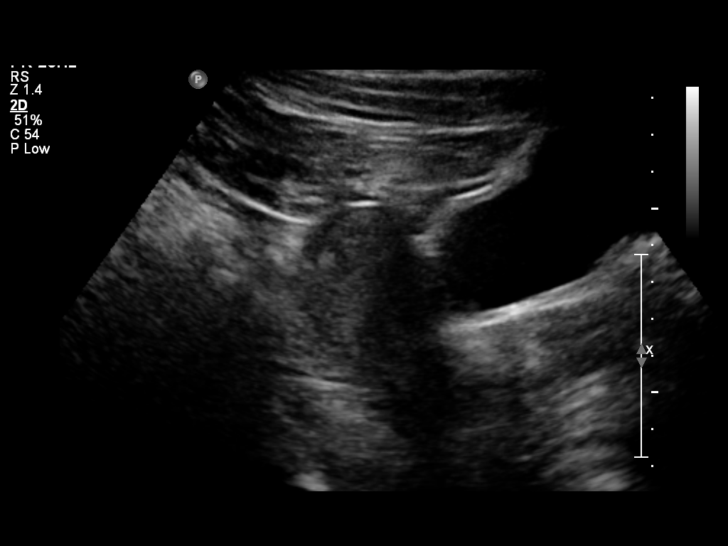
[im 5/52]
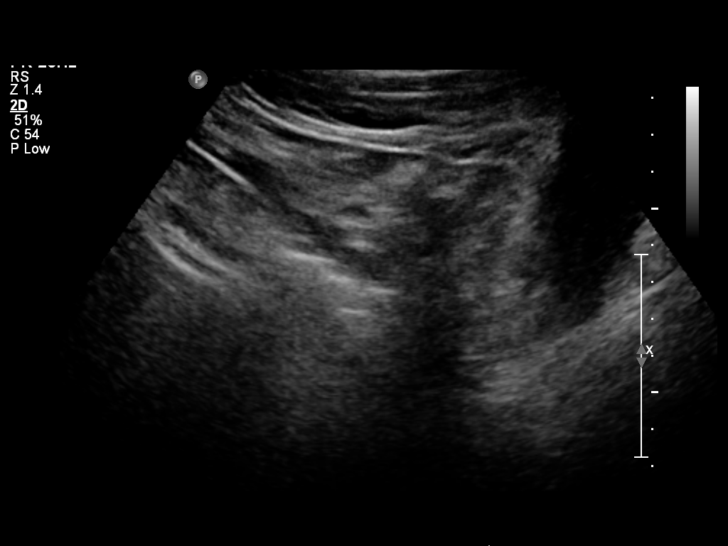
[im 9/52]
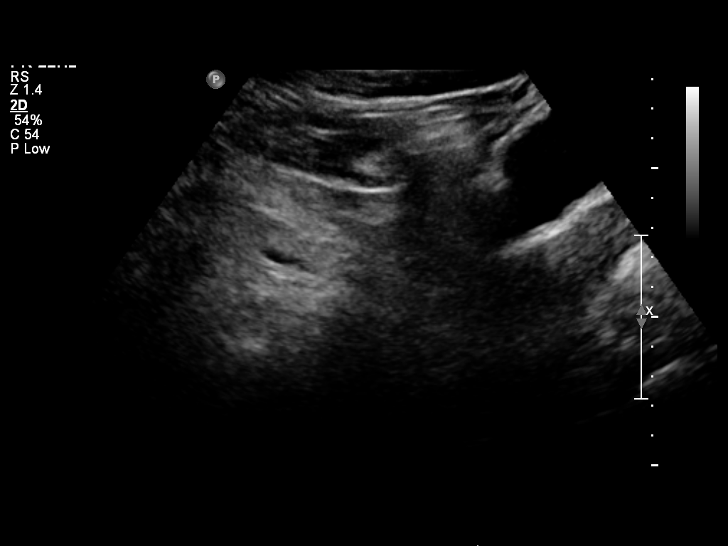
[im 13/52]
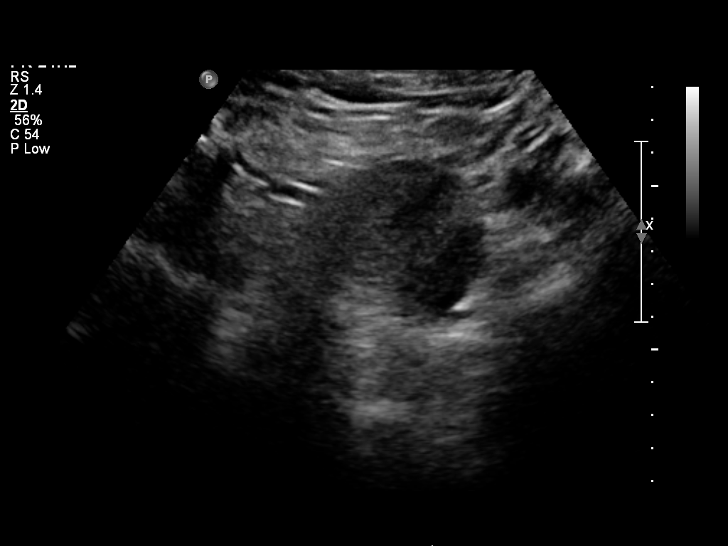
[im 18/52]
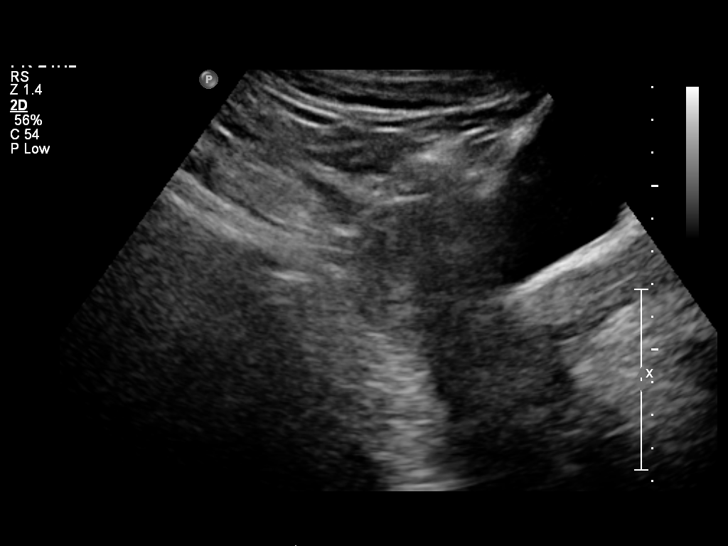
[im 20/52]
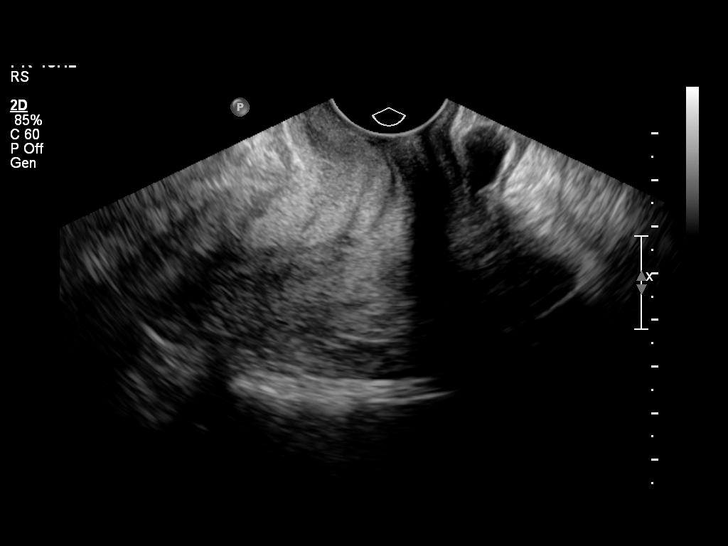
[im 24/52]
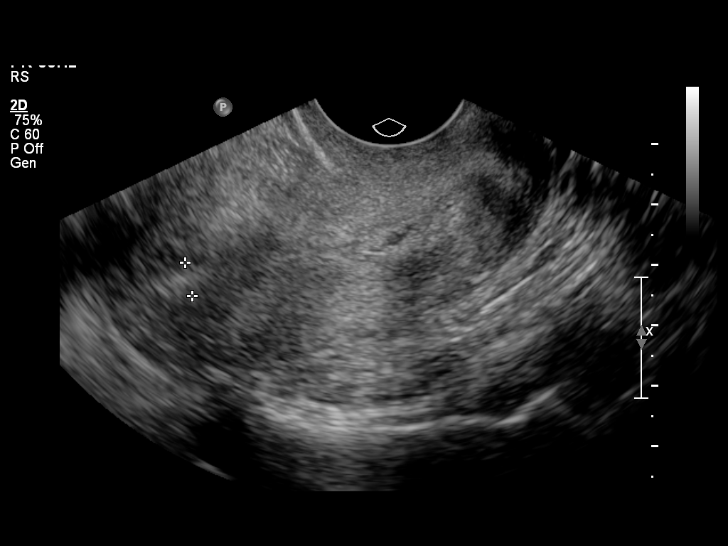
[im 28/52]
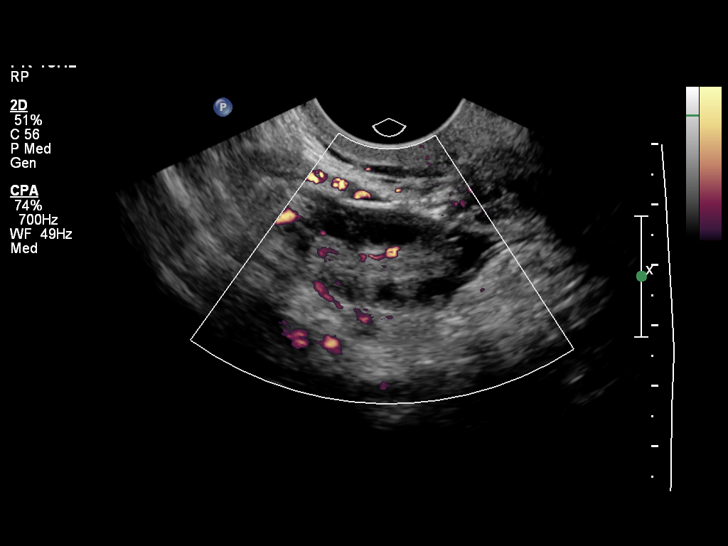
[im 32/52]
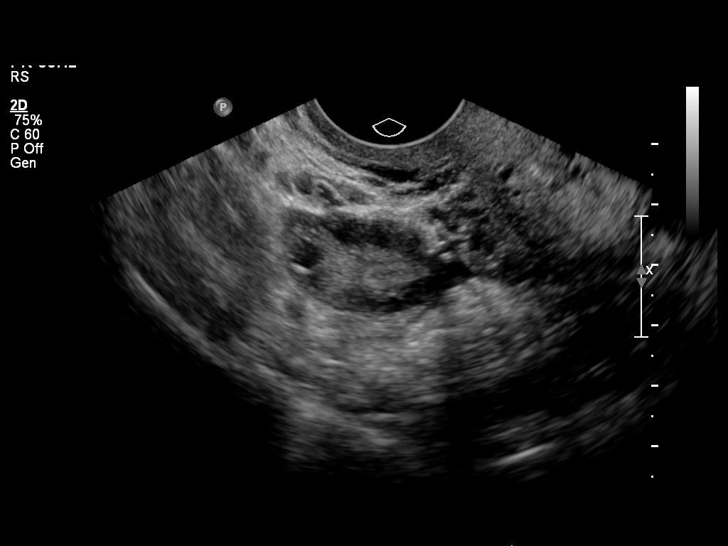
[im 35/52]
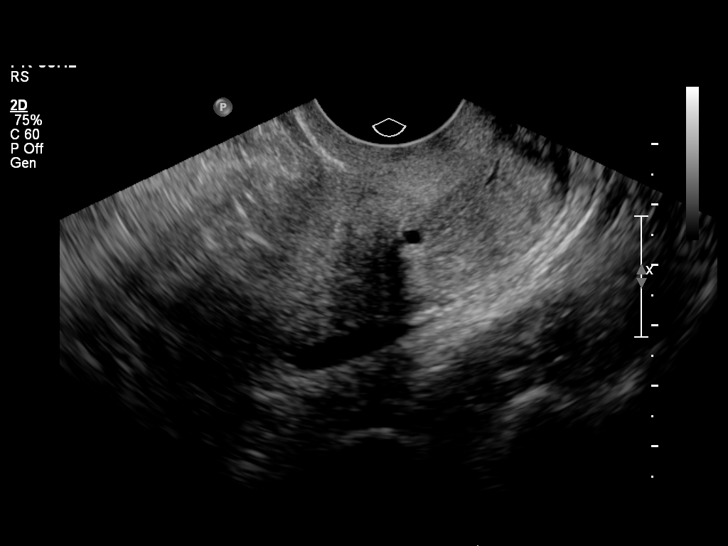
[im 39/52]
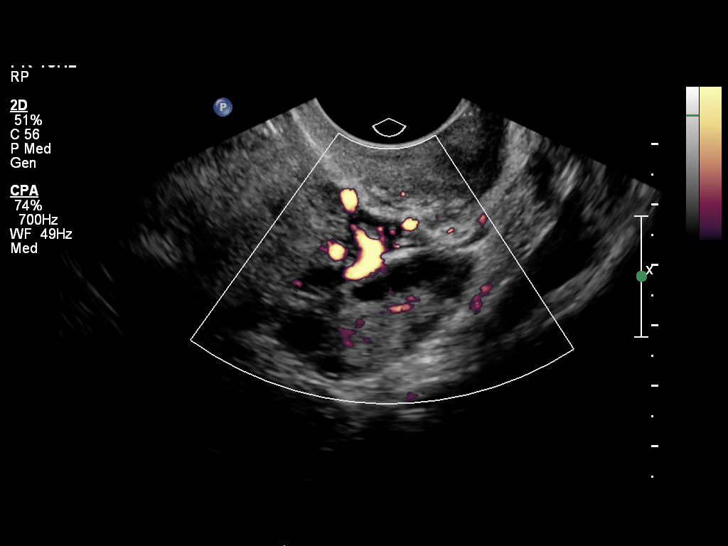
[im 43/52]
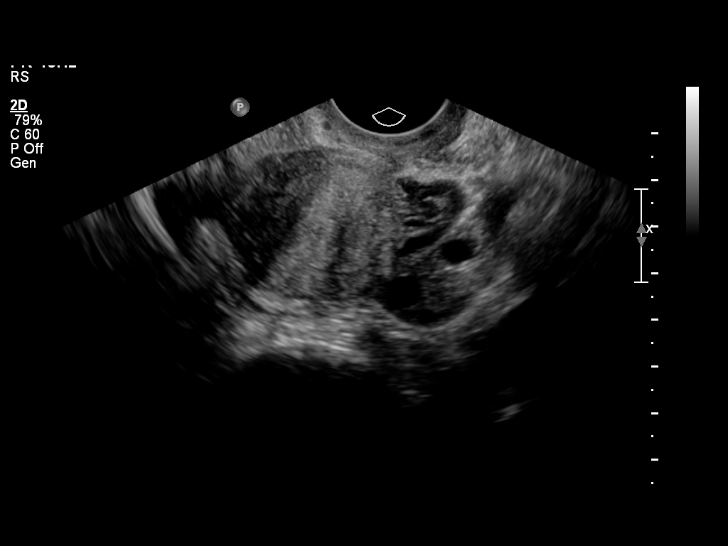
[im 47/52]
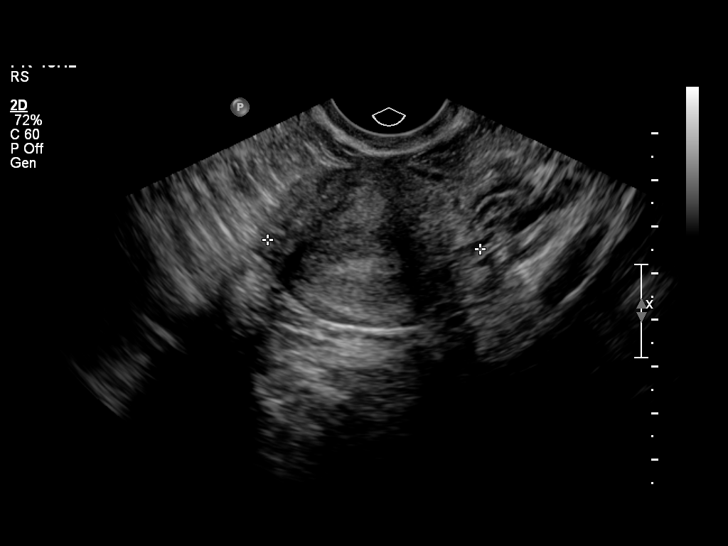
[im 52/52]
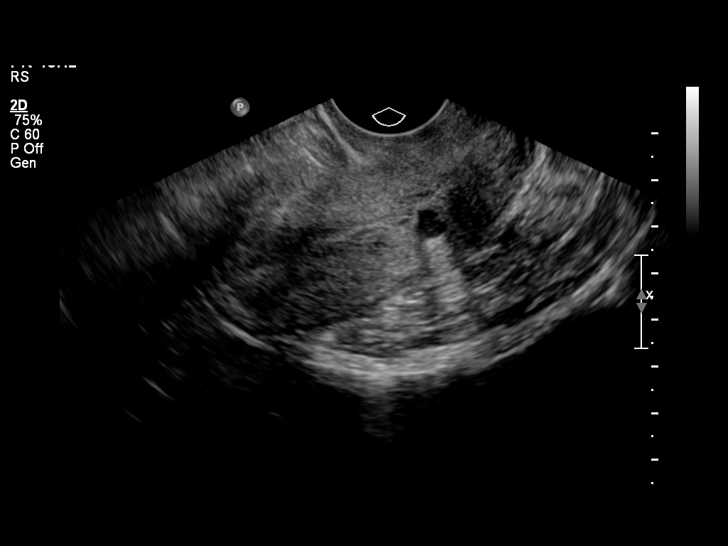

[14 of 25 positions shown; findings below may reference images not displayed]

It was necessary to proceed with endovaginal exam following the
transabdominal exam to visualize the myometrium, endometrium and
adnexa.
FINDINGS: Uterus: Demonstrates a sagittal length of 7.7 cm, depth of 3.2 cm
and width of 4.6 cm.  A homogeneous uterine myometrium is seen.

Endometrium: Appears thin and echogenic with an AP width of 5.7 cm.
No areas of focal thickening or heterogeneity are seen

Right ovary:  Has a normal appearance measuring 3.1 x 1.8 x 2.9 cm

Left ovary: Has a normal appearance measuring 3.1 x 1.5 x 2.5 cm

Other findings: No pelvic fluid or separate adnexal masses are
seen.
IMPRESSION: Normal pelvic ultrasound..

## 2013-02-10 ENCOUNTER — Emergency Department (HOSPITAL_COMMUNITY): Payer: Medicaid Other

## 2013-02-10 ENCOUNTER — Encounter (HOSPITAL_COMMUNITY): Payer: Self-pay | Admitting: Family Medicine

## 2013-02-10 ENCOUNTER — Emergency Department (HOSPITAL_COMMUNITY)
Admission: EM | Admit: 2013-02-10 | Discharge: 2013-02-10 | Disposition: A | Discharge status: Home / Self Care | Payer: Medicaid Other | Attending: Emergency Medicine | Admitting: Emergency Medicine

## 2013-02-10 DIAGNOSIS — G43909 Migraine, unspecified, not intractable, without status migrainosus: Secondary | ICD-10-CM | POA: Insufficient documentation

## 2013-02-10 DIAGNOSIS — Z79899 Other long term (current) drug therapy: Secondary | ICD-10-CM | POA: Insufficient documentation

## 2013-02-10 DIAGNOSIS — F411 Generalized anxiety disorder: Secondary | ICD-10-CM | POA: Insufficient documentation

## 2013-02-10 DIAGNOSIS — F329 Major depressive disorder, single episode, unspecified: Secondary | ICD-10-CM | POA: Insufficient documentation

## 2013-02-10 DIAGNOSIS — Z862 Personal history of diseases of the blood and blood-forming organs and certain disorders involving the immune mechanism: Secondary | ICD-10-CM | POA: Insufficient documentation

## 2013-02-10 DIAGNOSIS — Z7982 Long term (current) use of aspirin: Secondary | ICD-10-CM | POA: Insufficient documentation

## 2013-02-10 DIAGNOSIS — Z8639 Personal history of other endocrine, nutritional and metabolic disease: Secondary | ICD-10-CM | POA: Insufficient documentation

## 2013-02-10 DIAGNOSIS — F3289 Other specified depressive episodes: Secondary | ICD-10-CM | POA: Insufficient documentation

## 2013-02-10 DIAGNOSIS — F17200 Nicotine dependence, unspecified, uncomplicated: Secondary | ICD-10-CM | POA: Insufficient documentation

## 2013-02-10 DIAGNOSIS — Z884 Allergy status to anesthetic agent status: Secondary | ICD-10-CM | POA: Insufficient documentation

## 2013-02-10 DIAGNOSIS — R110 Nausea: Secondary | ICD-10-CM | POA: Insufficient documentation

## 2013-02-10 DIAGNOSIS — R109 Unspecified abdominal pain: Secondary | ICD-10-CM

## 2013-02-10 DIAGNOSIS — F430 Acute stress reaction: Secondary | ICD-10-CM | POA: Insufficient documentation

## 2013-02-10 DIAGNOSIS — Z8719 Personal history of other diseases of the digestive system: Secondary | ICD-10-CM | POA: Insufficient documentation

## 2013-02-10 DIAGNOSIS — Z3202 Encounter for pregnancy test, result negative: Secondary | ICD-10-CM | POA: Insufficient documentation

## 2013-02-10 DIAGNOSIS — M549 Dorsalgia, unspecified: Secondary | ICD-10-CM | POA: Insufficient documentation

## 2013-02-10 DIAGNOSIS — Z9089 Acquired absence of other organs: Secondary | ICD-10-CM | POA: Insufficient documentation

## 2013-02-10 DIAGNOSIS — R1011 Right upper quadrant pain: Secondary | ICD-10-CM | POA: Insufficient documentation

## 2013-02-10 LAB — COMPREHENSIVE METABOLIC PANEL
ALT: 46 U/L — ABNORMAL HIGH (ref 0–35)
AST: 24 U/L (ref 0–37)
Albumin: 3.5 g/dL (ref 3.5–5.2)
Alkaline Phosphatase: 101 U/L (ref 39–117)
BUN: 11 mg/dL (ref 6–23)
CO2: 25 mEq/L (ref 19–32)
Calcium: 8.7 mg/dL (ref 8.4–10.5)
Chloride: 99 mEq/L (ref 96–112)
Creatinine, Ser: 0.5 mg/dL (ref 0.50–1.10)
GFR calc Af Amer: >90 mL/min (ref >90)
GFR calc non Af Amer: >90 mL/min (ref >90)
Glucose, Bld: 95 mg/dL (ref 70–99)
Potassium: 4.2 mEq/L (ref 3.5–5.1)
Sodium: 134 mEq/L — ABNORMAL LOW (ref 135–145)
Total Bilirubin: 0.2 mg/dL — ABNORMAL LOW (ref 0.3–1.2)
Total Protein: 6.7 g/dL (ref 6.0–8.3)

## 2013-02-10 LAB — URINALYSIS, ROUTINE W REFLEX MICROSCOPIC
Bilirubin Urine: NEGATIVE
Glucose, UA: NEGATIVE mg/dL
Hgb urine dipstick: NEGATIVE
Ketones, ur: NEGATIVE mg/dL
Leukocytes, UA: NEGATIVE
Nitrite: NEGATIVE
Protein, ur: NEGATIVE mg/dL
Specific Gravity, Urine: 1.023 (ref 1.005–1.030)
Urobilinogen, UA: 1 mg/dL (ref 0.0–1.0)
pH: 6.5 (ref 5.0–8.0)

## 2013-02-10 LAB — LIPASE, BLOOD: Lipase: 18 U/L (ref 11–59)

## 2013-02-10 LAB — CBC WITH DIFFERENTIAL/PLATELET
Basophils Absolute: 0 10*3/uL (ref 0.0–0.1)
Basophils Relative: 0 % (ref 0–1)
Eosinophils Absolute: 0.3 10*3/uL (ref 0.0–0.7)
Eosinophils Relative: 3 % (ref 0–5)
HCT: 39.1 % (ref 36.0–46.0)
Hemoglobin: 13.7 g/dL (ref 12.0–15.0)
Lymphocytes Relative: 32 % (ref 12–46)
Lymphs Abs: 3.1 10*3/uL (ref 0.7–4.0)
MCH: 31.6 pg (ref 26.0–34.0)
MCHC: 35 g/dL (ref 30.0–36.0)
MCV: 90.3 fL (ref 78.0–100.0)
Monocytes Absolute: 0.9 10*3/uL (ref 0.1–1.0)
Monocytes Relative: 9 % (ref 3–12)
Neutro Abs: 5.4 10*3/uL (ref 1.7–7.7)
Neutrophils Relative %: 56 % (ref 43–77)
Platelets: 295 10*3/uL (ref 150–400)
RBC: 4.33 MIL/uL (ref 3.87–5.11)
RDW: 11.6 % (ref 11.5–15.5)
WBC: 9.7 10*3/uL (ref 4.0–10.5)

## 2013-02-10 LAB — PREGNANCY, URINE: Preg Test, Ur: NEGATIVE

## 2013-02-10 MED ORDER — IOHEXOL 300 MG/ML  SOLN
80.0000 mL | Freq: Once | INTRAMUSCULAR | Status: AC | PRN
  Administered 2013-02-10: 80 mL via INTRAVENOUS

## 2013-02-10 MED ORDER — ONDANSETRON HCL 4 MG/2ML IJ SOLN
4.0000 mg | Freq: Once | INTRAMUSCULAR | Status: AC
  Administered 2013-02-10: 4 mg via INTRAVENOUS
  Filled 2013-02-10: qty 2

## 2013-02-10 MED ORDER — LORAZEPAM 2 MG/ML IJ SOLN
0.5000 mg | Freq: Once | INTRAMUSCULAR | Status: AC
  Administered 2013-02-10: 0.5 mg via INTRAVENOUS
  Filled 2013-02-10: qty 1

## 2013-02-10 MED ORDER — DIPHENHYDRAMINE HCL 25 MG PO CAPS
25.0000 mg | ORAL_CAPSULE | Freq: Once | ORAL | Status: AC
  Administered 2013-02-10: 25 mg via ORAL
  Filled 2013-02-10: qty 1

## 2013-02-10 MED ORDER — ONDANSETRON 4 MG PO TBDP: ORAL_TABLET | ORAL | Status: DC

## 2013-02-10 MED ORDER — HYDROMORPHONE HCL PF 1 MG/ML IJ SOLN
0.5000 mg | Freq: Once | INTRAMUSCULAR | Status: AC
  Administered 2013-02-10: 0.5 mg via INTRAVENOUS
  Filled 2013-02-10: qty 1

## 2013-02-10 MED ORDER — IOHEXOL 300 MG/ML  SOLN
25.0000 mL | INTRAMUSCULAR | Status: AC
  Administered 2013-02-10: 25 mL via ORAL

## 2013-02-10 MED ORDER — MORPHINE SULFATE 4 MG/ML IJ SOLN
4.0000 mg | Freq: Once | INTRAMUSCULAR | Status: AC
  Administered 2013-02-10: 4 mg via INTRAVENOUS
  Filled 2013-02-10: qty 1

## 2013-02-10 MED ORDER — HYDROCODONE-ACETAMINOPHEN 5-325 MG PO TABS: 1.0000 | ORAL_TABLET | ORAL | Status: DC | PRN

## 2013-02-10 MED ORDER — SODIUM CHLORIDE 0.9 % IV BOLUS (SEPSIS)
1000.0000 mL | Freq: Once | INTRAVENOUS | Status: AC
  Administered 2013-02-10: 1000 mL via INTRAVENOUS

## 2013-02-10 NOTE — ED Notes (Signed)
Patient transported to CT 

## 2013-02-10 NOTE — ED Provider Notes (Signed)
History     CSN: [id number]  Arrival date & time 02/10/13  4098   First MD Initiated Contact with Patient 02/10/13 0802      Chief Complaint  Patient presents with  . Abdominal Pain    (Consider location/radiation/quality/duration/timing/severity/associated sxs/prior treatment) HPI PT with RUQ pain described as stabbing with nausea x 5 days. Pt states pain wraps around to back. No fever, chills. No urinary symptoms. Pt has had both Gb and appendix removed. +increased stress with father recently hospitalized.  Past Medical History  Diagnosis Date  . Migraine, unspecified, without mention of intractable migraine without mention of status migrainosus   . Vomiting alone   . Irritable bowel syndrome   . Esophageal reflux   . Diarrhea   . Depressive disorder, not elsewhere classified   . Panic disorder without agoraphobia   . Hypothyroid   . Depression     Past Surgical History  Procedure Laterality Date  . Wisdom tooth extraction    . Appendectomy    . Cholecystectomy  08/19/2011    Procedure: LAPAROSCOPIC CHOLECYSTECTOMY WITH INTRAOPERATIVE CHOLANGIOGRAM;  Surgeon: Velora Heckler, MD;  Location: [facility];  Service: General;  Laterality: N/A;  . Colon surgery      Family History  Problem Relation Age of Onset  . Diabetes Father   . Melanoma Father   . Colon polyps Father   . Colon cancer Neg Hx     History  Substance Use Topics  . Smoking status: Current Some Day Smoker -- 0.50 packs/day for 5 years    Types: Cigarettes  . Smokeless tobacco: Never Used  . Alcohol Use: No     Comment: socially    OB History   Grav Para Term Preterm Abortions TAB SAB Ect Mult Living   3    3 1 2    0      Review of Systems  Constitutional: Negative for fever and chills.  Respiratory: Negative for cough and shortness of breath.   Cardiovascular: Negative for chest pain.  Gastrointestinal: Positive for nausea and abdominal pain. Negative for vomiting and blood in stool.   Genitourinary: Negative for dysuria, hematuria, flank pain, vaginal bleeding and vaginal discharge.  Musculoskeletal: Positive for back pain. Negative for myalgias, joint swelling, arthralgias and gait problem.  Skin: Negative for rash and wound.  Neurological: Negative for dizziness, weakness and headaches.  All other systems reviewed and are negative.    Allergies  Dust mite extract and Procaine hcl  Home Medications   Current Outpatient Rx  Name  Route  Sig  Dispense  Refill  . ALPRAZolam (XANAX) 1 MG tablet   Oral   Take 1 mg by mouth 3 (three) times daily as needed for sleep or anxiety.         [name] aspirin EC 81 MG tablet   Oral   Take 81 mg by mouth daily.         [name] atenolol (TENORMIN) 100 MG tablet   Oral   Take 100 mg by mouth at bedtime.         . citalopram (CELEXA) 40 MG tablet   Oral   Take 40 mg by mouth at bedtime.         . promethazine (PHENERGAN) 25 MG tablet   Oral   Take 25 mg by mouth every 6 (six) hours as needed. nausea         . HYDROcodone-acetaminophen (NORCO) 5-325 MG per tablet   Oral   Take  1 tablet by mouth every 4 (four) hours as needed for pain.   10 tablet   0   . ondansetron (ZOFRAN ODT) 4 MG disintegrating tablet      4mg  ODT q4 hours prn nausea/vomit   8 tablet   0     BP 97/55  Pulse 60  Temp(Src) 97 F (36.1 C)  Resp 14  SpO2 96%  LMP 01/23/2013  Physical Exam  Nursing note and vitals reviewed. Constitutional: She is oriented to person, place, and time. She appears well-developed and well-nourished. No distress.  Anxious appearing  HENT:  Head: Normocephalic and atraumatic.  Mouth/Throat: Oropharynx is clear and moist.  Eyes: EOM are normal. Pupils are equal, round, and reactive to light.  Neck: Normal range of motion. Neck supple.  Cardiovascular: Normal rate and regular rhythm.   Pulmonary/Chest: Effort normal and breath sounds normal. No respiratory distress. She has no wheezes. She has no rales.   Abdominal: Soft. Bowel sounds are normal. She exhibits no distension and no mass. There is tenderness (diffuse abd tenderness, greater in RUQ. No rebound or guarding). There is no rebound and no guarding.  Musculoskeletal: Normal range of motion. She exhibits no edema and no tenderness.  No flank tenderness  Neurological: She is alert and oriented to person, place, and time.  Skin: Skin is warm and dry. No rash noted. No erythema.  Psychiatric: She has a normal mood and affect. Her behavior is normal.    ED Course  Procedures (including critical care time)  Labs Reviewed  COMPREHENSIVE METABOLIC PANEL - Abnormal; Notable for the following:    Sodium 134 (*)    ALT 46 (*)    Total Bilirubin 0.2 (*)    All other components within normal limits  URINALYSIS, ROUTINE W REFLEX MICROSCOPIC - Abnormal; Notable for the following:    APPearance CLOUDY (*)    All other components within normal limits  CBC WITH DIFFERENTIAL  LIPASE, BLOOD  PREGNANCY, URINE   Ct Abdomen Pelvis W Contrast  02/10/2013   *RADIOLOGY REPORT*  Clinical Data: Abdominal pain. Right upper quadrant pain.  Short of breath for 1 week.  Nausea.  Diarrhea.  History of colitis.  CT ABDOMEN AND PELVIS WITH CONTRAST  Technique:  Multidetector CT imaging of the abdomen and pelvis was performed following the standard protocol during bolus administration of intravenous contrast.  Contrast: 80mL OMNIPAQUE IOHEXOL 300 MG/ML  SOLN  Comparison: 12/30/2010.  Findings: Lung Bases: Scattered areas of atelectasis at the lung bases. Coronary artery atherosclerosis is present. If office based assessment of coronary risk factors has not been performed, it is now recommended.  Liver:  Tiny area of low attenuation is present in the lateral right hepatic lobe which is unchanged compared to prior, likely representing hemangioma.  Focal fatty infiltration adjacent to the falciform ligament.  Spleen:  Normal.  Gallbladder:  Cholecystectomy.  Common bile  duct:  Normal.  Pancreas:  Normal.  Adrenal glands:  Normal.  Kidneys:  Normal enhancement.  Probable cyst in the right interpolar region.  This appears unchanged compared to prior. Ureters appear within normal limits.  No calculi.  Stomach:  Normal.  Small bowel:  Duodenum normal.  Small bowel appears within normal limits.  No mesenteric adenopathy.  No inflammatory changes or obstruction.  Colon:   Fatty neural infiltration of the ascending colon wall is present, which is nonspecific but most commonly associated with prior episodes of colitis.  Appendectomy.  No right lower quadrant inflammatory changes.  Prominent lipoma of the ileocecal valve. The remainder of the colon appears within normal limits.  Pelvic Genitourinary:  No adenopathy or free fluid.  Physiologic appearance of the uterus and adnexa.  Urinary bladder normal.  Bones:  Mild bilateral hip osteoarthritis.  Synovial herniation cyst in the anterior right femoral neck.  No aggressive osseous lesions.  Vasculature: Minimal atherosclerosis.  No acute vascular abnormality.  Body Wall: Scarring is present in the periumbilical region.  This is probably related to prior cholecystectomy.  IMPRESSION: No acute abnormality.  Stable appearance of the liver and kidneys. Cholecystectomy.  Fatty infiltration of the ascending colonic wall which is commonly associated with prior colitis or inflammatory bowel disease.  No colonic inflammation on today's study.   Original Report Authenticated By: Andreas Newport, M.D.     1. Abdominal pain       MDM  Benign workup. Pt continues to request pain medicine. Concern for psychiatric component for pt's symptoms. Pt has has GI MD and is encouraged to f/u.          Loren Racer, MD 02/10/13 1243

## 2013-02-10 NOTE — ED Notes (Signed)
Per pt sts RUQ pain x 1 week. sts feel like stabbing pain. sts some nausea. sts the pain is constant but it gets worse.

## 2013-02-11 ENCOUNTER — Encounter (HOSPITAL_COMMUNITY): Payer: Self-pay | Admitting: Emergency Medicine

## 2013-02-11 ENCOUNTER — Emergency Department (HOSPITAL_COMMUNITY)
Admission: EM | Admit: 2013-02-11 | Discharge: 2013-02-12 | Disposition: A | Discharge status: Home / Self Care | Payer: Medicaid Other | Attending: Emergency Medicine | Admitting: Emergency Medicine

## 2013-02-11 ENCOUNTER — Emergency Department (HOSPITAL_COMMUNITY)
Admission: EM | Admit: 2013-02-11 | Discharge: 2013-02-11 | Disposition: A | Discharge status: Home / Self Care | Payer: Self-pay | Attending: Emergency Medicine | Admitting: Emergency Medicine

## 2013-02-11 DIAGNOSIS — F410 Panic disorder [episodic paroxysmal anxiety] without agoraphobia: Secondary | ICD-10-CM | POA: Insufficient documentation

## 2013-02-11 DIAGNOSIS — M549 Dorsalgia, unspecified: Secondary | ICD-10-CM | POA: Insufficient documentation

## 2013-02-11 DIAGNOSIS — F17200 Nicotine dependence, unspecified, uncomplicated: Secondary | ICD-10-CM | POA: Insufficient documentation

## 2013-02-11 DIAGNOSIS — Z8679 Personal history of other diseases of the circulatory system: Secondary | ICD-10-CM | POA: Insufficient documentation

## 2013-02-11 DIAGNOSIS — R110 Nausea: Secondary | ICD-10-CM

## 2013-02-11 DIAGNOSIS — G43909 Migraine, unspecified, not intractable, without status migrainosus: Secondary | ICD-10-CM | POA: Insufficient documentation

## 2013-02-11 DIAGNOSIS — F3289 Other specified depressive episodes: Secondary | ICD-10-CM | POA: Insufficient documentation

## 2013-02-11 DIAGNOSIS — K219 Gastro-esophageal reflux disease without esophagitis: Secondary | ICD-10-CM | POA: Insufficient documentation

## 2013-02-11 DIAGNOSIS — R6883 Chills (without fever): Secondary | ICD-10-CM | POA: Insufficient documentation

## 2013-02-11 DIAGNOSIS — R1900 Intra-abdominal and pelvic swelling, mass and lump, unspecified site: Secondary | ICD-10-CM | POA: Insufficient documentation

## 2013-02-11 DIAGNOSIS — R109 Unspecified abdominal pain: Secondary | ICD-10-CM | POA: Insufficient documentation

## 2013-02-11 DIAGNOSIS — Z79899 Other long term (current) drug therapy: Secondary | ICD-10-CM | POA: Insufficient documentation

## 2013-02-11 DIAGNOSIS — Z8639 Personal history of other endocrine, nutritional and metabolic disease: Secondary | ICD-10-CM | POA: Insufficient documentation

## 2013-02-11 DIAGNOSIS — R1031 Right lower quadrant pain: Secondary | ICD-10-CM

## 2013-02-11 DIAGNOSIS — Z7982 Long term (current) use of aspirin: Secondary | ICD-10-CM | POA: Insufficient documentation

## 2013-02-11 DIAGNOSIS — R1909 Other intra-abdominal and pelvic swelling, mass and lump: Secondary | ICD-10-CM

## 2013-02-11 DIAGNOSIS — F329 Major depressive disorder, single episode, unspecified: Secondary | ICD-10-CM | POA: Insufficient documentation

## 2013-02-11 DIAGNOSIS — Z8719 Personal history of other diseases of the digestive system: Secondary | ICD-10-CM | POA: Insufficient documentation

## 2013-02-11 DIAGNOSIS — R112 Nausea with vomiting, unspecified: Secondary | ICD-10-CM | POA: Insufficient documentation

## 2013-02-11 DIAGNOSIS — K5900 Constipation, unspecified: Secondary | ICD-10-CM | POA: Insufficient documentation

## 2013-02-11 DIAGNOSIS — Z862 Personal history of diseases of the blood and blood-forming organs and certain disorders involving the immune mechanism: Secondary | ICD-10-CM | POA: Insufficient documentation

## 2013-02-11 LAB — COMPREHENSIVE METABOLIC PANEL
ALT: 50 U/L — ABNORMAL HIGH (ref 0–35)
AST: 25 U/L (ref 0–37)
Albumin: 3.4 g/dL — ABNORMAL LOW (ref 3.5–5.2)
Alkaline Phosphatase: 94 U/L (ref 39–117)
BUN: 10 mg/dL (ref 6–23)
CO2: 28 mEq/L (ref 19–32)
Calcium: 8.5 mg/dL (ref 8.4–10.5)
Chloride: 100 mEq/L (ref 96–112)
Creatinine, Ser: 0.66 mg/dL (ref 0.50–1.10)
GFR calc Af Amer: >90 mL/min (ref >90)
GFR calc non Af Amer: >90 mL/min (ref >90)
Glucose, Bld: 103 mg/dL — ABNORMAL HIGH (ref 70–99)
Potassium: 3.7 mEq/L (ref 3.5–5.1)
Sodium: 136 mEq/L (ref 135–145)
Total Bilirubin: 0.3 mg/dL (ref 0.3–1.2)
Total Protein: 6.3 g/dL (ref 6.0–8.3)

## 2013-02-11 LAB — CBC WITH DIFFERENTIAL/PLATELET
Basophils Absolute: 0 10*3/uL (ref 0.0–0.1)
Basophils Relative: 0 % (ref 0–1)
Eosinophils Absolute: 0.2 10*3/uL (ref 0.0–0.7)
Eosinophils Relative: 2 % (ref 0–5)
HCT: 36.1 % (ref 36.0–46.0)
Hemoglobin: 12.1 g/dL (ref 12.0–15.0)
Lymphocytes Relative: 28 % (ref 12–46)
Lymphs Abs: 2.7 10*3/uL (ref 0.7–4.0)
MCH: 30.9 pg (ref 26.0–34.0)
MCHC: 33.5 g/dL (ref 30.0–36.0)
MCV: 92.1 fL (ref 78.0–100.0)
Monocytes Absolute: 0.5 10*3/uL (ref 0.1–1.0)
Monocytes Relative: 5 % (ref 3–12)
Neutro Abs: 6.3 10*3/uL (ref 1.7–7.7)
Neutrophils Relative %: 65 % (ref 43–77)
Platelets: 251 10*3/uL (ref 150–400)
RBC: 3.92 MIL/uL (ref 3.87–5.11)
RDW: 11.6 % (ref 11.5–15.5)
WBC: 9.7 10*3/uL (ref 4.0–10.5)

## 2013-02-11 LAB — LIPASE, BLOOD: Lipase: 17 U/L (ref 11–59)

## 2013-02-11 MED ORDER — POLYETHYLENE GLYCOL 3350 17 GM/SCOOP PO POWD: 17.0000 g | Freq: Every day | ORAL | Status: DC | PRN

## 2013-02-11 MED ORDER — PROMETHAZINE HCL 25 MG RE SUPP: 25.0000 mg | Freq: Four times a day (QID) | RECTAL | Status: DC | PRN

## 2013-02-11 MED ORDER — KETOROLAC TROMETHAMINE 30 MG/ML IJ SOLN
30.0000 mg | Freq: Once | INTRAMUSCULAR | Status: AC
  Administered 2013-02-11: 30 mg via INTRAVENOUS
  Filled 2013-02-11: qty 1

## 2013-02-11 MED ORDER — HYDROMORPHONE HCL PF 1 MG/ML IJ SOLN: 1.0000 mg | Freq: Once | INTRAMUSCULAR | Status: DC

## 2013-02-11 NOTE — ED Notes (Addendum)
Pt sts unable to void. NS bolus started per Britt Boozer PA-C

## 2013-02-11 NOTE — ED Provider Notes (Signed)
History     CSN: [id number]  Arrival date & time 02/11/13  4098   First MD Initiated Contact with Patient 02/11/13 1834      Chief Complaint  Patient presents with  . Abdominal Pain  . Back Pain    (Consider location/radiation/quality/duration/timing/severity/associated sxs/prior treatment) Patient is a 37 y.o. female presenting with abdominal pain and back pain. The history is provided by the patient.  Abdominal Pain This is a new problem. Associated symptoms include abdominal pain. Pertinent negatives include no chest pain, no headaches and no shortness of breath.  Back Pain Associated symptoms: abdominal pain   Associated symptoms: no chest pain, no headaches, no numbness and no weakness    patient states that she developed right-sided abdominal pain yesterday. She states her was in her upper abdomen and then went to her back. She states it is not worse when on to her lower back. She's had nausea and vomiting. No diarrhea. She states she's not had a bowel movement in 2 days. She states she has talked to her primary care Dr. who told her to come in to the ER. She states she has never had a CAT scan for it. She was seen at [facility] yesterday and had a CT scan and evaluation of the pain. She initially had not told me this. When confronted with this she states she didn't know the name of the test that she had a states that her doctor has told her to come in. She states she had taken Zofran without relief.   she also states she has a new pump in her groin area. She states she noticed it earlier today. She states it came up after vomiting.  Past Medical History  Diagnosis Date  . Migraine, unspecified, without mention of intractable migraine without mention of status migrainosus   . Vomiting alone   . Irritable bowel syndrome   . Esophageal reflux   . Diarrhea   . Depressive disorder, not elsewhere classified   . Panic disorder without agoraphobia   . Hypothyroid   .  Depression     Past Surgical History  Procedure Laterality Date  . Wisdom tooth extraction    . Appendectomy    . Cholecystectomy  08/19/2011    Procedure: LAPAROSCOPIC CHOLECYSTECTOMY WITH INTRAOPERATIVE CHOLANGIOGRAM;  Surgeon: Velora Heckler, MD;  Location: [facility];  Service: General;  Laterality: N/A;  . Colon surgery      Family History  Problem Relation Age of Onset  . Diabetes Father   . Melanoma Father   . Colon polyps Father   . Colon cancer Neg Hx     History  Substance Use Topics  . Smoking status: Current Some Day Smoker -- 0.50 packs/day for 5 years    Types: Cigarettes  . Smokeless tobacco: Never Used  . Alcohol Use: No     Comment: socially    OB History   Grav Para Term Preterm Abortions TAB SAB Ect Mult Living   3    3 1 2    0      Review of Systems  Constitutional: Negative for activity change and appetite change.  HENT: Negative for neck stiffness.   Eyes: Negative for pain.  Respiratory: Negative for chest tightness and shortness of breath.   Cardiovascular: Negative for chest pain and leg swelling.  Gastrointestinal: Positive for nausea, vomiting, abdominal pain and constipation. Negative for diarrhea.  Genitourinary: Negative for flank pain.  Musculoskeletal: Positive for back pain.  Skin:  Negative for rash.  Neurological: Negative for weakness, numbness and headaches.  Hematological: Negative for adenopathy.  Psychiatric/Behavioral: Negative for behavioral problems.    Allergies  Dust mite extract and Procaine hcl  Home Medications   Current Outpatient Rx  Name  Route  Sig  Dispense  Refill  . ALPRAZolam (XANAX) 1 MG tablet   Oral   Take 1 mg by mouth 3 (three) times daily as needed for sleep or anxiety.         [name] aspirin EC 81 MG tablet   Oral   Take 81 mg by mouth daily.         . citalopram (CELEXA) 40 MG tablet   Oral   Take 40 mg by mouth at bedtime.         . ondansetron (ZOFRAN ODT) 4 MG disintegrating tablet       4mg  ODT q4 hours prn nausea/vomit   8 tablet   0   . promethazine (PHENERGAN) 25 MG tablet   Oral   Take 25 mg by mouth every 6 (six) hours as needed. nausea         . atenolol (TENORMIN) 100 MG tablet   Oral   Take 100 mg by mouth at bedtime.         [name] HYDROcodone-acetaminophen (NORCO) 5-325 MG per tablet   Oral   Take 1 tablet by mouth every 4 (four) hours as needed for pain.   10 tablet   0   . polyethylene glycol powder (GLYCOLAX/MIRALAX) powder   Oral   Take 17 g by mouth daily as needed.   255 g   0   . promethazine (PHENERGAN) 25 MG suppository   Rectal   Place 1 suppository (25 mg total) rectally every 6 (six) hours as needed for nausea.   12 each   0     BP 120/81  Pulse 84  Temp(Src) 99.1 F (37.3 C) (Oral)  Resp 24  SpO2 100%  LMP 01/23/2013  Physical Exam  Nursing note and vitals reviewed. Constitutional: She is oriented to person, place, and time. She appears well-developed and well-nourished.  HENT:  Head: Normocephalic and atraumatic.  Eyes: EOM are normal. Pupils are equal, round, and reactive to light.  Neck: Normal range of motion. Neck supple.  Cardiovascular: Normal rate, regular rhythm and normal heart sounds.   No murmur heard. Pulmonary/Chest: Effort normal and breath sounds normal. No respiratory distress. She has no wheezes. She has no rales.  Abdominal: Soft. Bowel sounds are normal. She exhibits no distension. There is tenderness. There is no rebound and no guarding.  Mild abdominal tenderness.  Genitourinary:  1cm mildy tender mass right inguinal area deeply under labia majora. No fluctuance.   Musculoskeletal: Normal range of motion.  Neurological: She is alert and oriented to person, place, and time. No cranial nerve deficit.  Skin: Skin is warm and dry.  Psychiatric: She has a normal mood and affect. Her speech is normal.    ED Course  Procedures (including critical care time)  Labs Reviewed - No data to display Ct  Abdomen Pelvis W Contrast  02/10/2013   *RADIOLOGY REPORT*  Clinical Data: Abdominal pain. Right upper quadrant pain.  Short of breath for 1 week.  Nausea.  Diarrhea.  History of colitis.  CT ABDOMEN AND PELVIS WITH CONTRAST  Technique:  Multidetector CT imaging of the abdomen and pelvis was performed following the standard protocol during bolus administration of intravenous contrast.  Contrast: 80mL OMNIPAQUE  IOHEXOL 300 MG/ML  SOLN  Comparison: 12/30/2010.  Findings: Lung Bases: Scattered areas of atelectasis at the lung bases. Coronary artery atherosclerosis is present. If office based assessment of coronary risk factors has not been performed, it is now recommended.  Liver:  Tiny area of low attenuation is present in the lateral right hepatic lobe which is unchanged compared to prior, likely representing hemangioma.  Focal fatty infiltration adjacent to the falciform ligament.  Spleen:  Normal.  Gallbladder:  Cholecystectomy.  Common bile duct:  Normal.  Pancreas:  Normal.  Adrenal glands:  Normal.  Kidneys:  Normal enhancement.  Probable cyst in the right interpolar region.  This appears unchanged compared to prior. Ureters appear within normal limits.  No calculi.  Stomach:  Normal.  Small bowel:  Duodenum normal.  Small bowel appears within normal limits.  No mesenteric adenopathy.  No inflammatory changes or obstruction.  Colon:   Fatty neural infiltration of the ascending colon wall is present, which is nonspecific but most commonly associated with prior episodes of colitis.  Appendectomy.  No right lower quadrant inflammatory changes.  Prominent lipoma of the ileocecal valve. The remainder of the colon appears within normal limits.  Pelvic Genitourinary:  No adenopathy or free fluid.  Physiologic appearance of the uterus and adnexa.  Urinary bladder normal.  Bones:  Mild bilateral hip osteoarthritis.  Synovial herniation cyst in the anterior right femoral neck.  No aggressive osseous lesions.   Vasculature: Minimal atherosclerosis.  No acute vascular abnormality.  Body Wall: Scarring is present in the periumbilical region.  This is probably related to prior cholecystectomy.  IMPRESSION: No acute abnormality.  Stable appearance of the liver and kidneys. Cholecystectomy.  Fatty infiltration of the ascending colonic wall which is commonly associated with prior colitis or inflammatory bowel disease.  No colonic inflammation on today's study.   Original Report Authenticated By: Andreas Newport, M.D.     1. Abdominal pain   2. Groin mass       MDM   Patient with abdominal pain. Seen yesterday for same. Patient was not initially forthcoming with this for me. Lab work is reassuring. She also has a groin mass which is likely a lymph node told him to be followed. She'll be discharged home with Phenergan suppositories and MiraLAX. I did not think she has severe obstruction at this time. She does not appear dehydrated. She will be discharged       Juliet Rude. Rubin Payor, MD 02/11/13 2012

## 2013-02-11 NOTE — ED Notes (Addendum)
Pt reports 7/10 intermittent lower generalized abdominal pain and 8/10 lower back pain. Pt also reports finding a lump in her right thigh/pelvic area. Pt also reports nausea and vomiting. Pt also reports not being able to void: bowel or bladder. Pt reports last BM was yesterday and normal, however the pt reports having a history of IBS; pt reports last urination was at 0800. Pt denies vaginal bleeding or discharge.

## 2013-02-11 NOTE — ED Notes (Addendum)
Pt states she was seen in ED yesterday for abd pain.  Reports increased abd pain across abd that radiates to back today, nausea, vomiting, "lump" on groin area, and decreased urination.  Last BM yesterday- normally goes 6 times per day due to IBS.

## 2013-02-11 NOTE — ED Notes (Signed)
Pt sts increased nausea and begin vomiting yesterday after discharge. sts has urgency to urinate and defecate but unable too.

## 2013-02-11 NOTE — ED Provider Notes (Signed)
History     CSN: [id number]  Arrival date & time 02/11/13  2103   First MD Initiated Contact with Patient 02/11/13 2210      Chief Complaint  Patient presents with  . Abdominal Pain    (Consider location/radiation/quality/duration/timing/severity/associated sxs/prior treatment) HPI  Pt is a 37 yo F presenting with lower throbbing abdominal pain and back pain that rates "12/10" that has been going on for the last several days. Pt has nausea, non-bloody non-bilious vomiting that she has developed this morning. Pt states she has not had a bowel movement in two days and has no urinated since "this morning she thinks." Pt also states her groin is continuing to bother her, but has not noticed any swelling, erythema, drainage. Pt was evaluated yesterday and this morning in our emergency departments with negative work ups. Pt states she has not used at home symptomatic care as prescribed to help with symptoms including her zofran, phenergan, and pain medication. Pt called her PCP earlier this evening and was advised to return to the ED.   Past Medical History  Diagnosis Date  . Migraine, unspecified, without mention of intractable migraine without mention of status migrainosus   . Vomiting alone   . Irritable bowel syndrome   . Esophageal reflux   . Diarrhea   . Depressive disorder, not elsewhere classified   . Panic disorder without agoraphobia   . Hypothyroid   . Depression     Past Surgical History  Procedure Laterality Date  . Wisdom tooth extraction    . Appendectomy    . Cholecystectomy  08/19/2011    Procedure: LAPAROSCOPIC CHOLECYSTECTOMY WITH INTRAOPERATIVE CHOLANGIOGRAM;  Surgeon: Velora Heckler, MD;  Location: [facility];  Service: General;  Laterality: N/A;  . Colon surgery      Family History  Problem Relation Age of Onset  . Diabetes Father   . Melanoma Father   . Colon polyps Father   . Colon cancer Neg Hx     History  Substance Use Topics  . Smoking status:  Current Some Day Smoker -- 0.50 packs/day for 5 years    Types: Cigarettes  . Smokeless tobacco: Never Used  . Alcohol Use: No     Comment: socially    OB History   Grav Para Term Preterm Abortions TAB SAB Ect Mult Living   3    3 1 2    0      Review of Systems  Constitutional: Positive for chills.  HENT: Negative for neck stiffness.   Eyes: Negative.   Respiratory: Negative for shortness of breath.   Cardiovascular: Negative for chest pain.  Gastrointestinal: Positive for nausea, vomiting and abdominal pain.  Genitourinary: Positive for decreased urine volume. Negative for dysuria and flank pain.  Musculoskeletal: Positive for back pain.  Skin: Negative.   Neurological: Negative.     Allergies  Dust mite extract and Procaine hcl  Home Medications   Current Outpatient Rx  Name  Route  Sig  Dispense  Refill  . ALPRAZolam (XANAX) 1 MG tablet   Oral   Take 1 mg by mouth 3 (three) times daily as needed for sleep or anxiety.         [name] aspirin EC 81 MG tablet   Oral   Take 81 mg by mouth daily.         [name] atenolol (TENORMIN) 100 MG tablet   Oral   Take 100 mg by mouth at bedtime.         [name]  citalopram (CELEXA) 40 MG tablet   Oral   Take 40 mg by mouth at bedtime.         [name] HYDROcodone-acetaminophen (NORCO) 5-325 MG per tablet   Oral   Take 1 tablet by mouth every 4 (four) hours as needed for pain.   10 tablet   0   . ondansetron (ZOFRAN ODT) 4 MG disintegrating tablet      4mg  ODT q4 hours prn nausea/vomit   8 tablet   0   . promethazine (PHENERGAN) 25 MG tablet   Oral   Take 25 mg by mouth every 6 (six) hours as needed. nausea         . HYDROcodone-acetaminophen (NORCO/VICODIN) 5-325 MG per tablet   Oral   Take 1 tablet by mouth every 4 (four) hours as needed for pain.   10 tablet   0   . polyethylene glycol powder (GLYCOLAX/MIRALAX) powder   Oral   Take 17 g by mouth daily as needed.   255 g   0   . promethazine (PHENERGAN) 25 MG  suppository   Rectal   Place 1 suppository (25 mg total) rectally every 6 (six) hours as needed for nausea.   12 each   0     BP 128/82  Pulse 67  Temp(Src) 98.4 F (36.9 C)  Resp 16  SpO2 100%  LMP 01/23/2013  Physical Exam  Constitutional: She is oriented to person, place, and time. She appears well-developed and well-nourished. No distress.  HENT:  Head: Normocephalic and atraumatic.  Eyes: Conjunctivae are normal.  Neck: Neck supple.  Cardiovascular: Normal rate, regular rhythm, normal heart sounds and intact distal pulses.   Pulmonary/Chest: Effort normal and breath sounds normal. No respiratory distress.  Abdominal: Soft. Bowel sounds are normal. She exhibits no distension. There is tenderness. There is no rigidity, no rebound and no guarding.  Mild abdominal tenderness noted  Genitourinary:  1 cm mildly tender mass in right inguinal area deep under labia major. No erythema or fluctuance noted.   Musculoskeletal: Normal range of motion. She exhibits no edema.  Neurological: She is alert and oriented to person, place, and time.  Skin: Skin is warm and dry. She is not diaphoretic.  Psychiatric: She has a normal mood and affect.    ED Course  Procedures (including critical care time)  Labs Reviewed  COMPREHENSIVE METABOLIC PANEL - Abnormal; Notable for the following:    Glucose, Bld 103 (*)    Albumin 3.4 (*)    ALT 50 (*)    All other components within normal limits  CBC WITH DIFFERENTIAL  LIPASE, BLOOD  URINALYSIS, ROUTINE W REFLEX MICROSCOPIC   Ct Abdomen Pelvis W Contrast  02/10/2013   *RADIOLOGY REPORT*  Clinical Data: Abdominal pain. Right upper quadrant pain.  Short of breath for 1 week.  Nausea.  Diarrhea.  History of colitis.  CT ABDOMEN AND PELVIS WITH CONTRAST  Technique:  Multidetector CT imaging of the abdomen and pelvis was performed following the standard protocol during bolus administration of intravenous contrast.  Contrast: 80mL OMNIPAQUE IOHEXOL  300 MG/ML  SOLN  Comparison: 12/30/2010.  Findings: Lung Bases: Scattered areas of atelectasis at the lung bases. Coronary artery atherosclerosis is present. If office based assessment of coronary risk factors has not been performed, it is now recommended.  Liver:  Tiny area of low attenuation is present in the lateral right hepatic lobe which is unchanged compared to prior, likely representing hemangioma.  Focal fatty infiltration adjacent to  the falciform ligament.  Spleen:  Normal.  Gallbladder:  Cholecystectomy.  Common bile duct:  Normal.  Pancreas:  Normal.  Adrenal glands:  Normal.  Kidneys:  Normal enhancement.  Probable cyst in the right interpolar region.  This appears unchanged compared to prior. Ureters appear within normal limits.  No calculi.  Stomach:  Normal.  Small bowel:  Duodenum normal.  Small bowel appears within normal limits.  No mesenteric adenopathy.  No inflammatory changes or obstruction.  Colon:   Fatty neural infiltration of the ascending colon wall is present, which is nonspecific but most commonly associated with prior episodes of colitis.  Appendectomy.  No right lower quadrant inflammatory changes.  Prominent lipoma of the ileocecal valve. The remainder of the colon appears within normal limits.  Pelvic Genitourinary:  No adenopathy or free fluid.  Physiologic appearance of the uterus and adnexa.  Urinary bladder normal.  Bones:  Mild bilateral hip osteoarthritis.  Synovial herniation cyst in the anterior right femoral neck.  No aggressive osseous lesions.  Vasculature: Minimal atherosclerosis.  No acute vascular abnormality.  Body Wall: Scarring is present in the periumbilical region.  This is probably related to prior cholecystectomy.  IMPRESSION: No acute abnormality.  Stable appearance of the liver and kidneys. Cholecystectomy.  Fatty infiltration of the ascending colonic wall which is commonly associated with prior colitis or inflammatory bowel disease.  No colonic inflammation  on today's study.   Original Report Authenticated By: Andreas Newport, M.D.     1. Abdominal pain   2. Deep groin pain, right   3. Nausea       MDM  Patient is nontoxic, nonseptic appearing, in no apparent distress.  Patient's pain and other symptoms adequately managed in emergency department.  Fluid bolus given.  Labs, imaging from yesterday and vitals reviewed.  Patient does not meet the SIRS or Sepsis criteria.  On repeat exam patient does not have a surgical abdomen and there are nor peritoneal signs.  No indication of appendicitis, bowel obstruction, bowel perforation, cholecystitis, diverticulitis.  At this time no need for reimaging. Patient discharged home with symptomatic treatment and given strict instructions for follow-up with their primary care physician, gynecologist, and GI.  I have also discussed reasons to return immediately to the ER.  Patient expresses understanding and agrees with plan.Patient d/w with Dr. Oletta Lamas, agrees with plan. Patient is stable at time of discharge             Jeannetta Ellis, PA-C 02/12/13 0113

## 2013-02-12 LAB — URINALYSIS, ROUTINE W REFLEX MICROSCOPIC
Bilirubin Urine: NEGATIVE
Glucose, UA: NEGATIVE mg/dL
Hgb urine dipstick: NEGATIVE
Ketones, ur: NEGATIVE mg/dL
Leukocytes, UA: NEGATIVE
Nitrite: NEGATIVE
Protein, ur: NEGATIVE mg/dL
Specific Gravity, Urine: 1.016 (ref 1.005–1.030)
Urobilinogen, UA: 0.2 mg/dL (ref 0.0–1.0)
pH: 5.5 (ref 5.0–8.0)

## 2013-02-12 MED ORDER — HYDROCODONE-ACETAMINOPHEN 5-325 MG PO TABS: 1.0000 | ORAL_TABLET | ORAL | Status: DC | PRN

## 2013-02-12 NOTE — ED Provider Notes (Signed)
Medical screening examination/treatment/procedure(s) were conducted as a shared visit with non-physician practitioner(s) and myself.  I personally evaluated the patient during the encounter  I reviewed pt's labs, prior CT scan from yesterday.  Pt has small cyst or possibly lymph node right groin, tender.  No fluctuance, no surrounding erythema to suggest abscess.  Pt with soft abd, no guard or rebound.  Pt has pain meds and antiemetics at home already.  Pt encouraged to see PCP, GI and GYN as outpt .  Will give pt additional analgesics here, pt is tolerating PO's.      Gavin Pound. Oletta Lamas, MD 02/12/13 1610

## 2013-02-12 NOTE — ED Notes (Signed)
Removed IV line.

## 2013-04-28 ENCOUNTER — Emergency Department (HOSPITAL_COMMUNITY): Payer: Medicaid Other

## 2013-04-28 ENCOUNTER — Encounter (HOSPITAL_COMMUNITY): Payer: Self-pay | Admitting: Emergency Medicine

## 2013-04-28 ENCOUNTER — Emergency Department (HOSPITAL_COMMUNITY)
Admission: EM | Admit: 2013-04-28 | Discharge: 2013-04-28 | Disposition: A | Discharge status: Home / Self Care | Payer: Medicaid Other | Attending: Emergency Medicine | Admitting: Emergency Medicine

## 2013-04-28 DIAGNOSIS — Z79899 Other long term (current) drug therapy: Secondary | ICD-10-CM | POA: Insufficient documentation

## 2013-04-28 DIAGNOSIS — Z8639 Personal history of other endocrine, nutritional and metabolic disease: Secondary | ICD-10-CM | POA: Insufficient documentation

## 2013-04-28 DIAGNOSIS — Z8719 Personal history of other diseases of the digestive system: Secondary | ICD-10-CM | POA: Insufficient documentation

## 2013-04-28 DIAGNOSIS — K219 Gastro-esophageal reflux disease without esophagitis: Secondary | ICD-10-CM | POA: Insufficient documentation

## 2013-04-28 DIAGNOSIS — Z862 Personal history of diseases of the blood and blood-forming organs and certain disorders involving the immune mechanism: Secondary | ICD-10-CM | POA: Insufficient documentation

## 2013-04-28 DIAGNOSIS — G44209 Tension-type headache, unspecified, not intractable: Secondary | ICD-10-CM

## 2013-04-28 DIAGNOSIS — F17200 Nicotine dependence, unspecified, uncomplicated: Secondary | ICD-10-CM | POA: Insufficient documentation

## 2013-04-28 DIAGNOSIS — R51 Headache: Secondary | ICD-10-CM | POA: Insufficient documentation

## 2013-04-28 DIAGNOSIS — F3289 Other specified depressive episodes: Secondary | ICD-10-CM | POA: Insufficient documentation

## 2013-04-28 DIAGNOSIS — F329 Major depressive disorder, single episode, unspecified: Secondary | ICD-10-CM | POA: Insufficient documentation

## 2013-04-28 DIAGNOSIS — H53149 Visual discomfort, unspecified: Secondary | ICD-10-CM | POA: Insufficient documentation

## 2013-04-28 DIAGNOSIS — F410 Panic disorder [episodic paroxysmal anxiety] without agoraphobia: Secondary | ICD-10-CM | POA: Insufficient documentation

## 2013-04-28 MED ORDER — DIPHENHYDRAMINE HCL 50 MG/ML IJ SOLN
25.0000 mg | Freq: Once | INTRAMUSCULAR | Status: AC
  Administered 2013-04-28: 25 mg via INTRAVENOUS
  Filled 2013-04-28: qty 1

## 2013-04-28 MED ORDER — METOCLOPRAMIDE HCL 10 MG PO TABS: 10.0000 mg | ORAL_TABLET | Freq: Four times a day (QID) | ORAL | Status: DC | PRN

## 2013-04-28 MED ORDER — METOCLOPRAMIDE HCL 5 MG/ML IJ SOLN
10.0000 mg | Freq: Once | INTRAMUSCULAR | Status: AC
  Administered 2013-04-28: 10 mg via INTRAVENOUS
  Filled 2013-04-28: qty 2

## 2013-04-28 MED ORDER — SODIUM CHLORIDE 0.9 % IV SOLN: 1000.0000 mL | INTRAVENOUS | Status: DC

## 2013-04-28 MED ORDER — SODIUM CHLORIDE 0.9 % IV SOLN
1000.0000 mL | Freq: Once | INTRAVENOUS | Status: AC
  Administered 2013-04-28: 1000 mL via INTRAVENOUS

## 2013-04-28 MED ORDER — DEXAMETHASONE SODIUM PHOSPHATE 10 MG/ML IJ SOLN
10.0000 mg | Freq: Once | INTRAMUSCULAR | Status: AC
  Administered 2013-04-28: 10 mg via INTRAVENOUS
  Filled 2013-04-28: qty 1

## 2013-04-28 NOTE — Discharge Instructions (Signed)
Tension Headache A tension headache is a feeling of pain, pressure, or aching often felt over the front and sides of the head. The pain can be dull or can feel tight (constricting). It is the most common type of headache. Tension headaches are not normally associated with nausea or vomiting and do not get worse with physical activity. Tension headaches can last 30 minutes to several days.  CAUSES  The exact cause is not known, but it may be caused by chemicals and hormones in the brain that lead to pain. Tension headaches often begin after stress, anxiety, or depression. Other triggers may include:  Alcohol.  Caffeine (too much or withdrawal).  Respiratory infections (colds, flu, sinus infections).  Dental problems or teeth clenching.  Fatigue.  Holding your head and neck in one position too long while using a computer. SYMPTOMS   Pressure around the head.   Dull, aching head pain.   Pain felt over the front and sides of the head.   Tenderness in the muscles of the head, neck, and shoulders. DIAGNOSIS  A tension headache is often diagnosed based on:   Symptoms.   Physical examination.   A CT scan or MRI of your head. These tests may be ordered if symptoms are severe or unusual. TREATMENT  Medicines may be given to help relieve symptoms.  HOME CARE INSTRUCTIONS   Only take over-the-counter or prescription medicines for pain or discomfort as directed by your caregiver.   Lie down in a dark, quiet room when you have a headache.   Keep a journal to find out what may be triggering your headaches. For example, write down:  What you eat and drink.  How much sleep you get.  Any change to your diet or medicines.  Try massage or other relaxation techniques.   Ice packs or heat applied to the head and neck can be used. Use these 3 to 4 times per day for 15 to 20 minutes each time, or as needed.   Limit stress.   Sit up straight, and do not tense your muscles.    Quit smoking if you smoke.  Limit alcohol use.  Decrease the amount of caffeine you drink, or stop drinking caffeine.  Eat and exercise regularly.  Get 7 to 9 hours of sleep, or as recommended by your caregiver.  Avoid excessive use of pain medicine as recurrent headaches can occur.  SEEK MEDICAL CARE IF:   You have problems with the medicines you were prescribed.  Your medicines do not work.  You have a change from the usual headache.  You have nausea or vomiting. SEEK IMMEDIATE MEDICAL CARE IF:   Your headache becomes severe.  You have a fever.  You have a stiff neck.  You have loss of vision.  You have muscular weakness or loss of muscle control.  You lose your balance or have trouble walking.  You feel faint or pass out.  You have severe symptoms that are different from your first symptoms. MAKE SURE YOU:   Understand these instructions.  Will watch your condition.  Will get help right away if you are not doing well or get worse. Document Released: 08/18/2005 Document Revised: 11/10/2011 Document Reviewed: 08/08/2011 [facility] Patient Information 2014 [place], [place].  Metoclopramide tablets What is this medicine? METOCLOPRAMIDE (met oh kloe PRA mide) is used to treat the symptoms of gastroesophageal reflux disease (GERD) like heartburn. It is also used to treat people with slow emptying of the stomach and intestinal tract.  This medicine may be used for other purposes; ask your health care provider or pharmacist if you have questions. What should I tell my health care provider before I take this medicine? They need to know if you have any of these conditions: -breast cancer -depression -diabetes -heart failure -high blood pressure -kidney disease -liver disease -Parkinson's disease or a movement disorder -pheochromocytoma -seizures -stomach obstruction, bleeding, or perforation -an unusual or allergic reaction to metoclopramide, procainamide,  sulfites, other medicines, foods, dyes, or preservatives -pregnant or trying to get pregnant -breast-feeding How should I use this medicine? Take this medicine by mouth with a glass of water. Follow the directions on the prescription label. Take this medicine on an empty stomach, about 30 minutes before eating. Take your doses at regular intervals. Do not take your medicine more often than directed. Do not stop taking except on the advice of your doctor or health care professional. A special MedGuide will be given to you by the pharmacist with each prescription and refill. Be sure to read this information carefully each time. Talk to your pediatrician regarding the use of this medicine in children. Special care may be needed. Overdosage: If you think you have taken too much of this medicine contact a poison control center or emergency room at once. NOTE: This medicine is only for you. Do not share this medicine with others. What if I miss a dose? If you miss a dose, take it as soon as you can. If it is almost time for your next dose, take only that dose. Do not take double or extra doses. What may interact with this medicine? -acetaminophen -cyclosporine -digoxin -medicines for blood pressure -medicines for diabetes, including insulin -medicines for hay fever and other allergies -medicines for depression, especially an Monoamine Oxidase Inhibitor (MAOI) -medicines for Parkinson's disease, like levodopa -medicines for sleep or for pain -tetracycline This list may not describe all possible interactions. Give your health care provider a list of all the medicines, herbs, non-prescription drugs, or dietary supplements you use. Also tell them if you smoke, drink alcohol, or use illegal drugs. Some items may interact with your medicine. What should I watch for while using this medicine? It may take a few weeks for your stomach condition to start to get better. However, do not take this medicine for  longer than 12 weeks. The longer you take this medicine, and the more you take it, the greater your chances are of developing serious side effects. If you are an elderly patient, a female patient, or you have diabetes, you may be at an increased risk for side effects from this medicine. Contact your doctor immediately if you start having movements you cannot control such as lip smacking, rapid movements of the tongue, involuntary or uncontrollable movements of the eyes, head, arms and legs, or muscle twitches and spasms. Patients and their families should watch out for worsening depression or thoughts of suicide. Also watch out for any sudden or severe changes in feelings such as feeling anxious, agitated, panicky, irritable, hostile, aggressive, impulsive, severely restless, overly excited and hyperactive, or not being able to sleep. If this happens, especially at the beginning of treatment or after a change in dose, call your doctor. Do not treat yourself for high fever. Ask your doctor or health care professional for advice. You may get drowsy or dizzy. Do not drive, use machinery, or do anything that needs mental alertness until you know how this drug affects you. Do not stand or sit up  quickly, especially if you are an older patient. This reduces the risk of dizzy or fainting spells. Alcohol can make you more drowsy and dizzy. Avoid alcoholic drinks. What side effects may I notice from receiving this medicine? Side effects that you should report to your doctor or health care professional as soon as possible: -allergic reactions like skin rash, itching or hives, swelling of the face, lips, or tongue -abnormal production of milk in females -breast enlargement in both males and females -change in the way you walk -difficulty moving, speaking or swallowing -drooling, lip smacking, or rapid movements of the tongue -excessive sweating -fever -involuntary or uncontrollable movements of the eyes, head,  arms and legs -irregular heartbeat or palpitations -muscle twitches and spasms -unusually weak or tired Side effects that usually do not require medical attention (report to your doctor or health care professional if they continue or are bothersome): -change in sex drive or performance -depressed mood -diarrhea -difficulty sleeping -headache -menstrual changes -restless or nervous This list may not describe all possible side effects. Call your doctor for medical advice about side effects. You may report side effects to FDA at 1-800-FDA-1088. Where should I keep my medicine? Keep out of the reach of children. Store at room temperature between 20 and 25 degrees C (68 and 77 degrees F). Protect from light. Keep container tightly closed. Throw away any unused medicine after the expiration date. NOTE: This sheet is a summary. It may not cover all possible information. If you have questions about this medicine, talk to your doctor, pharmacist, or health care provider.  2012, [employer]. (04/12/2008 4:30:05 PM)
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
## 2013-06-23 ENCOUNTER — Emergency Department (HOSPITAL_COMMUNITY)
Admission: EM | Admit: 2013-06-23 | Discharge: 2013-06-23 | Disposition: A | Discharge status: Home / Self Care | Payer: Medicaid Other | Attending: Emergency Medicine | Admitting: Emergency Medicine

## 2013-06-23 ENCOUNTER — Encounter (HOSPITAL_COMMUNITY): Payer: Self-pay | Admitting: Emergency Medicine

## 2013-06-23 DIAGNOSIS — Z79899 Other long term (current) drug therapy: Secondary | ICD-10-CM | POA: Insufficient documentation

## 2013-06-23 DIAGNOSIS — Z791 Long term (current) use of non-steroidal anti-inflammatories (NSAID): Secondary | ICD-10-CM | POA: Insufficient documentation

## 2013-06-23 DIAGNOSIS — G43909 Migraine, unspecified, not intractable, without status migrainosus: Secondary | ICD-10-CM | POA: Insufficient documentation

## 2013-06-23 DIAGNOSIS — Z8719 Personal history of other diseases of the digestive system: Secondary | ICD-10-CM | POA: Insufficient documentation

## 2013-06-23 DIAGNOSIS — F17200 Nicotine dependence, unspecified, uncomplicated: Secondary | ICD-10-CM | POA: Insufficient documentation

## 2013-06-23 DIAGNOSIS — F3289 Other specified depressive episodes: Secondary | ICD-10-CM | POA: Insufficient documentation

## 2013-06-23 DIAGNOSIS — Z862 Personal history of diseases of the blood and blood-forming organs and certain disorders involving the immune mechanism: Secondary | ICD-10-CM | POA: Insufficient documentation

## 2013-06-23 DIAGNOSIS — F913 Oppositional defiant disorder: Secondary | ICD-10-CM | POA: Insufficient documentation

## 2013-06-23 DIAGNOSIS — R519 Headache, unspecified: Secondary | ICD-10-CM

## 2013-06-23 DIAGNOSIS — F329 Major depressive disorder, single episode, unspecified: Secondary | ICD-10-CM | POA: Insufficient documentation

## 2013-06-23 DIAGNOSIS — F410 Panic disorder [episodic paroxysmal anxiety] without agoraphobia: Secondary | ICD-10-CM | POA: Insufficient documentation

## 2013-06-23 DIAGNOSIS — R112 Nausea with vomiting, unspecified: Secondary | ICD-10-CM | POA: Insufficient documentation

## 2013-06-23 DIAGNOSIS — Z8639 Personal history of other endocrine, nutritional and metabolic disease: Secondary | ICD-10-CM | POA: Insufficient documentation

## 2013-06-23 LAB — CBC WITH DIFFERENTIAL/PLATELET
Basophils Absolute: 0 10*3/uL (ref 0.0–0.1)
Basophils Relative: 1 % (ref 0–1)
Eosinophils Absolute: 0.2 10*3/uL (ref 0.0–0.7)
Eosinophils Relative: 2 % (ref 0–5)
HCT: 42.6 % (ref 36.0–46.0)
Hemoglobin: 14.2 g/dL (ref 12.0–15.0)
Lymphocytes Relative: 32 % (ref 12–46)
Lymphs Abs: 2.5 10*3/uL (ref 0.7–4.0)
MCH: 30.8 pg (ref 26.0–34.0)
MCHC: 33.3 g/dL (ref 30.0–36.0)
MCV: 92.4 fL (ref 78.0–100.0)
Monocytes Absolute: 0.5 10*3/uL (ref 0.1–1.0)
Monocytes Relative: 6 % (ref 3–12)
Neutro Abs: 4.7 10*3/uL (ref 1.7–7.7)
Neutrophils Relative %: 60 % (ref 43–77)
Platelets: 394 10*3/uL (ref 150–400)
RBC: 4.61 MIL/uL (ref 3.87–5.11)
RDW: 11.9 % (ref 11.5–15.5)
WBC: 7.9 10*3/uL (ref 4.0–10.5)

## 2013-06-23 LAB — BASIC METABOLIC PANEL
BUN: 13 mg/dL (ref 6–23)
CO2: 25 mEq/L (ref 19–32)
Calcium: 10.1 mg/dL (ref 8.4–10.5)
Chloride: 100 mEq/L (ref 96–112)
Creatinine, Ser: 0.69 mg/dL (ref 0.50–1.10)
GFR calc Af Amer: >90 mL/min (ref >90)
GFR calc non Af Amer: >90 mL/min (ref >90)
Glucose, Bld: 118 mg/dL — ABNORMAL HIGH (ref 70–99)
Potassium: 4.1 mEq/L (ref 3.5–5.1)
Sodium: 137 mEq/L (ref 135–145)

## 2013-06-23 MED ORDER — PROMETHAZINE HCL 25 MG/ML IJ SOLN
25.0000 mg | Freq: Once | INTRAMUSCULAR | Status: AC
  Administered 2013-06-23: 25 mg via INTRAVENOUS
  Filled 2013-06-23: qty 1

## 2013-06-23 MED ORDER — SODIUM CHLORIDE 0.9 % IV BOLUS (SEPSIS)
1000.0000 mL | Freq: Once | INTRAVENOUS | Status: AC
  Administered 2013-06-23: 1000 mL via INTRAVENOUS

## 2013-06-23 MED ORDER — DIPHENHYDRAMINE HCL 50 MG/ML IJ SOLN
25.0000 mg | Freq: Once | INTRAMUSCULAR | Status: AC
  Administered 2013-06-23: 25 mg via INTRAVENOUS
  Filled 2013-06-23: qty 1

## 2013-06-23 MED ORDER — KETOROLAC TROMETHAMINE 30 MG/ML IJ SOLN
30.0000 mg | Freq: Once | INTRAMUSCULAR | Status: AC
  Administered 2013-06-23: 30 mg via INTRAVENOUS
  Filled 2013-06-23: qty 1

## 2013-06-23 MED ORDER — HYDROMORPHONE HCL PF 1 MG/ML IJ SOLN
1.0000 mg | Freq: Once | INTRAMUSCULAR | Status: AC
  Administered 2013-06-23: 1 mg via INTRAVENOUS
  Filled 2013-06-23: qty 1

## 2013-06-23 MED ORDER — METOCLOPRAMIDE HCL 5 MG/ML IJ SOLN
10.0000 mg | Freq: Once | INTRAMUSCULAR | Status: AC
  Administered 2013-06-23: 10 mg via INTRAVENOUS
  Filled 2013-06-23: qty 2

## 2013-06-23 MED ORDER — ONDANSETRON 4 MG PO TBDP: 4.0000 mg | ORAL_TABLET | Freq: Three times a day (TID) | ORAL | Status: DC | PRN

## 2013-06-23 NOTE — ED Provider Notes (Signed)
CSN: [id number]     Arrival date & time 06/23/13  1216 History   First MD Initiated Contact with Patient 06/23/13 1250     Chief Complaint  Patient presents with  . headache, spinal fluid leak in past    (Consider location/radiation/quality/duration/timing/severity/associated sxs/prior Treatment) HPI Comments: Patient is a 37 year old female with a past medical history of chronic migraines who presents with a headache for 4 days. Patient reports a gradual onset and progressive worsening of the headache. The pain is sharp, constant and is located in generalized head without radiation. Patient has tried Mobic for symptoms without relief. No alleviating/aggravating factors. Patient reports associated nausea and vomiting. Patient denies fever, diarrhea, numbness/tingling, weakness, visual changes, congestion, chest pain, SOB, abdominal pain.      Past Medical History  Diagnosis Date  . Migraine, unspecified, without mention of intractable migraine without mention of status migrainosus   . Vomiting alone   . Irritable bowel syndrome   . Esophageal reflux   . Diarrhea   . Depressive disorder, not elsewhere classified   . Panic disorder without agoraphobia   . Hypothyroid   . Depression    Past Surgical History  Procedure Laterality Date  . Wisdom tooth extraction    . Appendectomy    . Cholecystectomy  08/19/2011    Procedure: LAPAROSCOPIC CHOLECYSTECTOMY WITH INTRAOPERATIVE CHOLANGIOGRAM;  Surgeon: Velora Heckler, MD;  Location: [facility];  Service: General;  Laterality: N/A;  . Colon surgery     Family History  Problem Relation Age of Onset  . Diabetes Father   . Melanoma Father   . Colon polyps Father   . Colon cancer Neg Hx    History  Substance Use Topics  . Smoking status: Current Some Day Smoker -- 0.50 packs/day for 5 years    Types: Cigarettes  . Smokeless tobacco: Never Used  . Alcohol Use: No     Comment: socially   OB History   Grav Para Term Preterm Abortions TAB  SAB Ect Mult Living   3    3 1 2    0     Review of Systems  Gastrointestinal: Positive for nausea and vomiting.  Neurological: Positive for headaches.  All other systems reviewed and are negative.    Allergies  Dust mite extract and Procaine hcl  Home Medications   Current Outpatient Rx  Name  Route  Sig  Dispense  Refill  . ALPRAZolam (XANAX) 1 MG tablet   Oral   Take 1 mg by mouth 3 (three) times daily as needed for anxiety.          [name] atenolol (TENORMIN) 100 MG tablet   Oral   Take 100 mg by mouth at bedtime.         . cetirizine (ZYRTEC) 10 MG tablet   Oral   Take 10 mg by mouth daily.         . citalopram (CELEXA) 40 MG tablet   Oral   Take 40 mg by mouth at bedtime.         . meloxicam (MOBIC) 15 MG tablet   Oral   Take 15 mg by mouth daily.         . promethazine (PHENERGAN) 25 MG tablet   Oral   Take 25 mg by mouth every 6 (six) hours as needed. nausea          BP 107/72  Pulse 76  Temp(Src) 98.3 F (36.8 C) (Oral)  Resp 18  SpO2 97% Physical Exam  Nursing note and vitals reviewed. Constitutional: She is oriented to person, place, and time. She appears well-developed and well-nourished. No distress.  HENT:  Head: Normocephalic and atraumatic.  Eyes: Conjunctivae and EOM are normal.  Neck: Normal range of motion.  Cardiovascular: Normal rate and regular rhythm.  Exam reveals no gallop and no friction rub.   No murmur heard. Pulmonary/Chest: Effort normal and breath sounds normal. She has no wheezes. She has no rales. She exhibits no tenderness.  Abdominal: Soft. She exhibits no distension. There is no tenderness. There is no rebound and no guarding.  Musculoskeletal: Normal range of motion.  Neurological: She is alert and oriented to person, place, and time. Coordination normal.  Speech is goal-oriented. Moves limbs without ataxia.   Skin: Skin is warm and dry.  Psychiatric: She has a normal mood and affect.  Odd behavior.     ED  Course  Procedures (including critical care time) Labs Review Labs Reviewed  BASIC METABOLIC PANEL - Abnormal; Notable for the following:    Glucose, Bld 118 (*)    All other components within normal limits  CBC WITH DIFFERENTIAL   Imaging Review No results found.  EKG Interpretation   None       MDM   1. Headache     3:03 PM Patient feeling better. Labs unremarkable for acute changes. Vitals stable and patient afebrile. No meningeal signs. Patient has odd behavior. I doubt life-threatening etiology at this time. Patient will be discharged without further evaluation. Patient instructed to return with worsening or concerning symptoms.     Emilia Beck, PA-C 06/23/13 [phone]

## 2013-06-23 NOTE — ED Notes (Signed)
Bed: WA04 Expected date:  Expected time:  Means of arrival:  Comments: Triage 4 

## 2013-06-23 NOTE — ED Provider Notes (Signed)
Medical screening examination/treatment/procedure(s) were performed by non-physician practitioner and as supervising physician I was immediately available for consultation/collaboration.   Jakhi Dishman David Maudell Stanbrough, MD 06/23/13 1613 
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
## 2013-07-05 ENCOUNTER — Telehealth: Payer: Self-pay | Admitting: Internal Medicine

## 2013-07-05 MED ORDER — ALPRAZOLAM 1 MG PO TABS: 1.0000 mg | ORAL_TABLET | Freq: Three times a day (TID) | ORAL | Status: DC | PRN

## 2013-07-05 NOTE — Telephone Encounter (Signed)
[facility] [phone] per Marchelle Folks called in xanax 1 mg one po tid # 90, 0 refills

## 2013-07-05 NOTE — Telephone Encounter (Signed)
REFILL ON XANAX 1MG  [id number]

## 2013-07-07 ENCOUNTER — Other Ambulatory Visit: Payer: Self-pay

## 2013-08-04 ENCOUNTER — Other Ambulatory Visit: Payer: Self-pay | Admitting: Physician Assistant

## 2013-08-05 ENCOUNTER — Encounter (HOSPITAL_COMMUNITY): Payer: Self-pay | Admitting: Emergency Medicine

## 2013-08-05 ENCOUNTER — Inpatient Hospital Stay (HOSPITAL_COMMUNITY)
Admission: EM | Admit: 2013-08-05 | Discharge: 2013-08-11 | DRG: 918 | Disposition: A | Discharge status: Other Acute Inpatient Hospital | Payer: Medicaid Other | Attending: Internal Medicine | Admitting: Internal Medicine

## 2013-08-05 DIAGNOSIS — F329 Major depressive disorder, single episode, unspecified: Secondary | ICD-10-CM | POA: Diagnosis present

## 2013-08-05 DIAGNOSIS — F1010 Alcohol abuse, uncomplicated: Secondary | ICD-10-CM | POA: Diagnosis present

## 2013-08-05 DIAGNOSIS — T50992A Poisoning by other drugs, medicaments and biological substances, intentional self-harm, initial encounter: Secondary | ICD-10-CM | POA: Diagnosis present

## 2013-08-05 DIAGNOSIS — R748 Abnormal levels of other serum enzymes: Secondary | ICD-10-CM

## 2013-08-05 DIAGNOSIS — E039 Hypothyroidism, unspecified: Secondary | ICD-10-CM | POA: Diagnosis present

## 2013-08-05 DIAGNOSIS — T424X1A Poisoning by benzodiazepines, accidental (unintentional), initial encounter: Secondary | ICD-10-CM

## 2013-08-05 DIAGNOSIS — A0472 Enterocolitis due to Clostridium difficile, not specified as recurrent: Secondary | ICD-10-CM

## 2013-08-05 DIAGNOSIS — F410 Panic disorder [episodic paroxysmal anxiety] without agoraphobia: Secondary | ICD-10-CM

## 2013-08-05 DIAGNOSIS — T43502A Poisoning by unspecified antipsychotics and neuroleptics, intentional self-harm, initial encounter: Secondary | ICD-10-CM | POA: Diagnosis present

## 2013-08-05 DIAGNOSIS — G43909 Migraine, unspecified, not intractable, without status migrainosus: Secondary | ICD-10-CM

## 2013-08-05 DIAGNOSIS — K589 Irritable bowel syndrome without diarrhea: Secondary | ICD-10-CM

## 2013-08-05 DIAGNOSIS — Z833 Family history of diabetes mellitus: Secondary | ICD-10-CM

## 2013-08-05 DIAGNOSIS — Z808 Family history of malignant neoplasm of other organs or systems: Secondary | ICD-10-CM

## 2013-08-05 DIAGNOSIS — Z83719 Family history of colon polyps, unspecified: Secondary | ICD-10-CM

## 2013-08-05 DIAGNOSIS — T50901A Poisoning by unspecified drugs, medicaments and biological substances, accidental (unintentional), initial encounter: Secondary | ICD-10-CM

## 2013-08-05 DIAGNOSIS — R579 Shock, unspecified: Secondary | ICD-10-CM | POA: Diagnosis present

## 2013-08-05 DIAGNOSIS — T424X4A Poisoning by benzodiazepines, undetermined, initial encounter: Principal | ICD-10-CM | POA: Diagnosis present

## 2013-08-05 DIAGNOSIS — R1110 Vomiting, unspecified: Secondary | ICD-10-CM

## 2013-08-05 DIAGNOSIS — F1910 Other psychoactive substance abuse, uncomplicated: Secondary | ICD-10-CM | POA: Diagnosis present

## 2013-08-05 DIAGNOSIS — R7401 Elevation of levels of liver transaminase levels: Secondary | ICD-10-CM | POA: Diagnosis present

## 2013-08-05 DIAGNOSIS — K219 Gastro-esophageal reflux disease without esophagitis: Secondary | ICD-10-CM

## 2013-08-05 DIAGNOSIS — T447X2A Poisoning by beta-adrenoreceptor antagonists, intentional self-harm, initial encounter: Secondary | ICD-10-CM

## 2013-08-05 DIAGNOSIS — T465X1A Poisoning by other antihypertensive drugs, accidental (unintentional), initial encounter: Secondary | ICD-10-CM | POA: Diagnosis present

## 2013-08-05 DIAGNOSIS — E669 Obesity, unspecified: Secondary | ICD-10-CM | POA: Diagnosis present

## 2013-08-05 DIAGNOSIS — E861 Hypovolemia: Secondary | ICD-10-CM | POA: Diagnosis present

## 2013-08-05 DIAGNOSIS — R7402 Elevation of levels of lactic acid dehydrogenase (LDH): Secondary | ICD-10-CM | POA: Diagnosis present

## 2013-08-05 DIAGNOSIS — R197 Diarrhea, unspecified: Secondary | ICD-10-CM

## 2013-08-05 DIAGNOSIS — Z6830 Body mass index (BMI) 30.0-30.9, adult: Secondary | ICD-10-CM

## 2013-08-05 DIAGNOSIS — T447X2S Poisoning by beta-adrenoreceptor antagonists, intentional self-harm, sequela: Secondary | ICD-10-CM

## 2013-08-05 DIAGNOSIS — F3289 Other specified depressive episodes: Secondary | ICD-10-CM

## 2013-08-05 DIAGNOSIS — E2749 Other adrenocortical insufficiency: Secondary | ICD-10-CM | POA: Diagnosis present

## 2013-08-05 DIAGNOSIS — F419 Anxiety disorder, unspecified: Secondary | ICD-10-CM

## 2013-08-05 DIAGNOSIS — F17200 Nicotine dependence, unspecified, uncomplicated: Secondary | ICD-10-CM | POA: Diagnosis present

## 2013-08-05 DIAGNOSIS — Z79899 Other long term (current) drug therapy: Secondary | ICD-10-CM

## 2013-08-05 DIAGNOSIS — R031 Nonspecific low blood-pressure reading: Secondary | ICD-10-CM | POA: Diagnosis present

## 2013-08-05 DIAGNOSIS — Z8371 Family history of colonic polyps: Secondary | ICD-10-CM

## 2013-08-05 DIAGNOSIS — F339 Major depressive disorder, recurrent, unspecified: Secondary | ICD-10-CM

## 2013-08-05 MED ORDER — SODIUM CHLORIDE 0.9 % IV BOLUS (SEPSIS)
1000.0000 mL | Freq: Once | INTRAVENOUS | Status: AC
Start: 2013-08-05 — End: 2013-08-06
  Administered 2013-08-05: 1000 mL via INTRAVENOUS

## 2013-08-05 NOTE — ED Notes (Signed)
EMS called to home by mother.  Found patient ambulatory outside of residence. She states that she took half a bottle of 90 count xanax 1mg , an unknown amount Of atenolol and some wine  VS stable on arrival.  EMS states patient never had LOC But has been lethargic.

## 2013-08-05 NOTE — ED Notes (Signed)
Bed: RESA Expected date:  Expected time:  Means of arrival:  Comments: EMS 37yo F, overdose on 45-60 tabs of Xanax

## 2013-08-06 ENCOUNTER — Encounter: Payer: Self-pay | Admitting: Internal Medicine

## 2013-08-06 ENCOUNTER — Encounter (HOSPITAL_COMMUNITY): Payer: Self-pay | Admitting: Internal Medicine

## 2013-08-06 DIAGNOSIS — R579 Shock, unspecified: Secondary | ICD-10-CM | POA: Diagnosis present

## 2013-08-06 DIAGNOSIS — T424X1A Poisoning by benzodiazepines, accidental (unintentional), initial encounter: Secondary | ICD-10-CM

## 2013-08-06 DIAGNOSIS — T424X4A Poisoning by benzodiazepines, undetermined, initial encounter: Secondary | ICD-10-CM

## 2013-08-06 DIAGNOSIS — T50901A Poisoning by unspecified drugs, medicaments and biological substances, accidental (unintentional), initial encounter: Secondary | ICD-10-CM

## 2013-08-06 DIAGNOSIS — T448X1A Poisoning by centrally-acting and adrenergic-neuron-blocking agents, accidental (unintentional), initial encounter: Secondary | ICD-10-CM

## 2013-08-06 DIAGNOSIS — T447X2A Poisoning by beta-adrenoreceptor antagonists, intentional self-harm, initial encounter: Secondary | ICD-10-CM

## 2013-08-06 DIAGNOSIS — F419 Anxiety disorder, unspecified: Secondary | ICD-10-CM | POA: Insufficient documentation

## 2013-08-06 DIAGNOSIS — R748 Abnormal levels of other serum enzymes: Secondary | ICD-10-CM

## 2013-08-06 LAB — MAGNESIUM: Magnesium: 1.4 mg/dL — ABNORMAL LOW (ref 1.5–2.5)

## 2013-08-06 LAB — CBC WITH DIFFERENTIAL/PLATELET
Basophils Absolute: 0 10*3/uL (ref 0.0–0.1)
Basophils Relative: 0 % (ref 0–1)
Eosinophils Absolute: 0.2 10*3/uL (ref 0.0–0.7)
Eosinophils Relative: 2 % (ref 0–5)
HCT: 37.5 % (ref 36.0–46.0)
Hemoglobin: 12.8 g/dL (ref 12.0–15.0)
Lymphocytes Relative: 31 % (ref 12–46)
Lymphs Abs: 2.7 10*3/uL (ref 0.7–4.0)
MCH: 31.1 pg (ref 26.0–34.0)
MCHC: 34.1 g/dL (ref 30.0–36.0)
MCV: 91 fL (ref 78.0–100.0)
Monocytes Absolute: 0.7 10*3/uL (ref 0.1–1.0)
Monocytes Relative: 8 % (ref 3–12)
Neutro Abs: 5.3 10*3/uL (ref 1.7–7.7)
Neutrophils Relative %: 60 % (ref 43–77)
Platelets: 300 10*3/uL (ref 150–400)
RBC: 4.12 MIL/uL (ref 3.87–5.11)
RDW: 11.9 % (ref 11.5–15.5)
WBC: 8.9 10*3/uL (ref 4.0–10.5)

## 2013-08-06 LAB — ACETAMINOPHEN LEVEL: Acetaminophen (Tylenol), Serum: <15 ug/mL (ref 10–30)

## 2013-08-06 LAB — BLOOD GAS, ARTERIAL
Acid-base deficit: 1.8 mmol/L (ref 0.0–2.0)
Bicarbonate: 24 mEq/L (ref 20.0–24.0)
Drawn by: 235321
O2 Content: 3 L/min
O2 Saturation: 98 %
Patient temperature: 37
TCO2: 22.2 mmol/L (ref 0–100)
pCO2 arterial: 47.9 mmHg — ABNORMAL HIGH (ref 35.0–45.0)
pH, Arterial: 7.32 — ABNORMAL LOW (ref 7.350–7.450)
pO2, Arterial: 137 mmHg — ABNORMAL HIGH (ref 80.0–100.0)

## 2013-08-06 LAB — URINALYSIS, ROUTINE W REFLEX MICROSCOPIC
Bilirubin Urine: NEGATIVE
Glucose, UA: NEGATIVE mg/dL
Hgb urine dipstick: NEGATIVE
Ketones, ur: NEGATIVE mg/dL
Leukocytes, UA: NEGATIVE
Nitrite: NEGATIVE
Protein, ur: NEGATIVE mg/dL
Specific Gravity, Urine: 1.006 (ref 1.005–1.030)
Urobilinogen, UA: 0.2 mg/dL (ref 0.0–1.0)
pH: 5.5 (ref 5.0–8.0)

## 2013-08-06 LAB — CBC
HCT: 33.7 % — ABNORMAL LOW (ref 36.0–46.0)
Hemoglobin: 11.5 g/dL — ABNORMAL LOW (ref 12.0–15.0)
MCH: 31 pg (ref 26.0–34.0)
MCHC: 34.1 g/dL (ref 30.0–36.0)
MCV: 90.8 fL (ref 78.0–100.0)
Platelets: 276 10*3/uL (ref 150–400)
RBC: 3.71 MIL/uL — ABNORMAL LOW (ref 3.87–5.11)
RDW: 11.9 % (ref 11.5–15.5)
WBC: 9.1 10*3/uL (ref 4.0–10.5)

## 2013-08-06 LAB — COMPREHENSIVE METABOLIC PANEL
ALT: 53 U/L — ABNORMAL HIGH (ref 0–35)
AST: 43 U/L — ABNORMAL HIGH (ref 0–37)
Albumin: 3.4 g/dL — ABNORMAL LOW (ref 3.5–5.2)
Alkaline Phosphatase: 100 U/L (ref 39–117)
BUN: 10 mg/dL (ref 6–23)
CO2: 22 mEq/L (ref 19–32)
Calcium: 8.3 mg/dL — ABNORMAL LOW (ref 8.4–10.5)
Chloride: 104 mEq/L (ref 96–112)
Creatinine, Ser: 0.52 mg/dL (ref 0.50–1.10)
GFR calc Af Amer: >90 mL/min (ref >90)
GFR calc non Af Amer: >90 mL/min (ref >90)
Glucose, Bld: 110 mg/dL — ABNORMAL HIGH (ref 70–99)
Potassium: 3.7 mEq/L (ref 3.5–5.1)
Sodium: 139 mEq/L (ref 135–145)
Total Bilirubin: <0.1 mg/dL — ABNORMAL LOW (ref 0.3–1.2)
Total Protein: 6.3 g/dL (ref 6.0–8.3)

## 2013-08-06 LAB — RAPID URINE DRUG SCREEN, HOSP PERFORMED
Amphetamines: NOT DETECTED
Barbiturates: NOT DETECTED
Benzodiazepines: POSITIVE — AB
Cocaine: NOT DETECTED
Opiates: NOT DETECTED
Tetrahydrocannabinol: NOT DETECTED

## 2013-08-06 LAB — BASIC METABOLIC PANEL
BUN: 10 mg/dL (ref 6–23)
CO2: 25 mEq/L (ref 19–32)
Calcium: 7.9 mg/dL — ABNORMAL LOW (ref 8.4–10.5)
Chloride: 107 mEq/L (ref 96–112)
Creatinine, Ser: 0.52 mg/dL (ref 0.50–1.10)
GFR calc Af Amer: >90 mL/min (ref >90)
GFR calc non Af Amer: >90 mL/min (ref >90)
Glucose, Bld: 144 mg/dL — ABNORMAL HIGH (ref 70–99)
Potassium: 3.6 mEq/L (ref 3.5–5.1)
Sodium: 143 mEq/L (ref 135–145)

## 2013-08-06 LAB — PREGNANCY, URINE: Preg Test, Ur: NEGATIVE

## 2013-08-06 LAB — SALICYLATE LEVEL: Salicylate Lvl: <2 mg/dL — ABNORMAL LOW (ref 2.8–20.0)

## 2013-08-06 LAB — PHOSPHORUS: Phosphorus: 2.8 mg/dL (ref 2.3–4.6)

## 2013-08-06 LAB — ETHANOL: Alcohol, Ethyl (B): 115 mg/dL — ABNORMAL HIGH (ref 0–11)

## 2013-08-06 LAB — MRSA PCR SCREENING: MRSA by PCR: NEGATIVE

## 2013-08-06 LAB — CLOSTRIDIUM DIFFICILE BY PCR: Toxigenic C. Difficile by PCR: POSITIVE — AB

## 2013-08-06 MED ORDER — SODIUM CHLORIDE 0.9 % IV BOLUS (SEPSIS)
750.0000 mL | Freq: Once | INTRAVENOUS | Status: AC
  Administered 2013-08-06: 750 mL via INTRAVENOUS

## 2013-08-06 MED ORDER — SODIUM CHLORIDE 0.9 % IV SOLN: 250.0000 mL | INTRAVENOUS | Status: DC | PRN

## 2013-08-06 MED ORDER — SODIUM CHLORIDE 0.9 % IV SOLN
INTRAVENOUS | Status: AC
  Administered 2013-08-06: 20:00:00 via INTRAVENOUS

## 2013-08-06 MED ORDER — HEPARIN SODIUM (PORCINE) 5000 UNIT/ML IJ SOLN
5000.0000 [IU] | Freq: Three times a day (TID) | INTRAMUSCULAR | Status: DC
  Administered 2013-08-06 – 2013-08-11 (×17): 5000 [IU] via SUBCUTANEOUS
  Filled 2013-08-06 (×19): qty 1

## 2013-08-06 MED ORDER — GLUCAGON HCL (RDNA) 1 MG IJ SOLR
3.0000 mg | Freq: Once | INTRAMUSCULAR | Status: AC
  Administered 2013-08-06: 3 mg via INTRAVENOUS

## 2013-08-06 MED ORDER — GLUCAGON HCL (RDNA) 1 MG IJ SOLR
INTRAMUSCULAR | Status: AC
  Filled 2013-08-06: qty 1

## 2013-08-06 MED ORDER — SODIUM CHLORIDE 0.9 % IV BOLUS (SEPSIS)
1000.0000 mL | Freq: Once | INTRAVENOUS | Status: AC
  Administered 2013-08-06: 1000 mL via INTRAVENOUS

## 2013-08-06 MED ORDER — GLUCAGON (RDNA) 1 MG IJ KIT
PACK | INTRAVENOUS | Status: DC
  Administered 2013-08-06: 03:00:00 via INTRAVENOUS
  Filled 2013-08-06 (×4): qty 250

## 2013-08-06 MED ORDER — PANTOPRAZOLE SODIUM 40 MG IV SOLR: 40.0000 mg | Freq: Every day | INTRAVENOUS | Status: DC

## 2013-08-06 MED ORDER — SODIUM CHLORIDE 0.9 % IV SOLN: 1.0000 g | Freq: Once | INTRAVENOUS | Status: DC

## 2013-08-06 MED ORDER — SODIUM CHLORIDE 0.9 % IV SOLN
INTRAVENOUS | Status: DC
  Administered 2013-08-06 – 2013-08-08 (×6): via INTRAVENOUS

## 2013-08-06 MED ORDER — METRONIDAZOLE 500 MG PO TABS
500.0000 mg | ORAL_TABLET | Freq: Three times a day (TID) | ORAL | Status: DC
  Administered 2013-08-06 – 2013-08-11 (×16): 500 mg via ORAL
  Filled 2013-08-06 (×18): qty 1

## 2013-08-06 MED ORDER — ONDANSETRON HCL 4 MG/2ML IJ SOLN
4.0000 mg | Freq: Once | INTRAMUSCULAR | Status: AC
  Administered 2013-08-06: 4 mg via INTRAVENOUS
  Filled 2013-08-06: qty 2

## 2013-08-06 MED ORDER — SODIUM BICARBONATE 8.4 % IV SOLN
INTRAVENOUS | Status: AC
  Administered 2013-08-06: 50 meq
  Filled 2013-08-06: qty 50

## 2013-08-06 MED ORDER — SODIUM CHLORIDE 0.9 % IV BOLUS (SEPSIS)
500.0000 mL | INTRAVENOUS | Status: AC
  Administered 2013-08-06: 500 mL via INTRAVENOUS

## 2013-08-06 MED ORDER — HEPARIN SODIUM (PORCINE) 5000 UNIT/ML IJ SOLN: 5000.0000 [IU] | Freq: Three times a day (TID) | INTRAMUSCULAR | Status: DC

## 2013-08-06 MED ORDER — SODIUM CHLORIDE 0.9 % IV BOLUS (SEPSIS): 750.0000 mL | Freq: Once | INTRAVENOUS | Status: DC

## 2013-08-06 MED ORDER — MAGNESIUM SULFATE 40 MG/ML IJ SOLN
2.0000 g | Freq: Once | INTRAMUSCULAR | Status: AC
  Administered 2013-08-06: 2 g via INTRAVENOUS
  Filled 2013-08-06: qty 50

## 2013-08-06 NOTE — Progress Notes (Signed)
Lab called to report that the patient had a positive C diff.  Dr. Herma Carson notified of lab results.  No new orders received.  Continue to monitor.  Nissa Stannard Debroah Loop RN

## 2013-08-06 NOTE — Progress Notes (Signed)
eLink Physician-Brief Progress Note Patient Name: Lori Marshall DOB: 1975-09-10 MRN: [id number]  Date of Service  08/06/2013   HPI/Events of Note   Beta blocker and benzodiazepene overdose   eICU Interventions  See admission orders, full h and p to follow   Intervention Category Major Interventions: Other:  Shan Levans 08/06/2013, 1:15 AM

## 2013-08-06 NOTE — ED Notes (Signed)
Attempted lab draw x 2 but unsuccessful. Main lab called to come and draw labs.
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
## 2013-08-06 NOTE — ED Provider Notes (Signed)
CSN: [id number]     Arrival date & time 08/05/13  2307 History   First MD Initiated Contact with Patient 08/05/13 2309     Chief Complaint  Patient presents with  . Drug Overdose   (Consider location/radiation/quality/duration/timing/severity/associated sxs/prior Treatment) HPI Patient arrives via EMS as an intentional overdose. She states she took roughly 60 Xanax 1 mg tabs and 30 atenolol 50 mg tabs 30-45 minutes prior to arrival. Patient states that the overdose was intentional. She denies any nausea vomiting or abdominal pain. She denies any difficulty breathing. She states that the episode was prompted by a disagreement with her family. She's been off her Celexa for 3 weeks. Past Medical History  Diagnosis Date  . Migraine, unspecified, without mention of intractable migraine without mention of status migrainosus   . Vomiting alone   . Irritable bowel syndrome   . Esophageal reflux   . Diarrhea   . Depressive disorder, not elsewhere classified   . Panic disorder without agoraphobia   . Hypothyroid   . Depression    Past Surgical History  Procedure Laterality Date  . Wisdom tooth extraction    . Appendectomy    . Cholecystectomy  08/19/2011    Procedure: LAPAROSCOPIC CHOLECYSTECTOMY WITH INTRAOPERATIVE CHOLANGIOGRAM;  Surgeon: Velora Heckler, MD;  Location: [facility];  Service: General;  Laterality: N/A;  . Colon surgery     Family History  Problem Relation Age of Onset  . Diabetes Father   . Melanoma Father   . Colon polyps Father   . Colon cancer Neg Hx    History  Substance Use Topics  . Smoking status: Current Some Day Smoker -- 0.50 packs/day for 5 years    Types: Cigarettes  . Smokeless tobacco: Never Used  . Alcohol Use: No     Comment: socially   OB History   Grav Para Term Preterm Abortions TAB SAB Ect Mult Living   3    3 1 2    0     Review of Systems  Constitutional: Negative for fever and chills.  HENT: Negative for sore throat and trouble swallowing.    Respiratory: Negative for shortness of breath.   Cardiovascular: Negative for chest pain.  Gastrointestinal: Negative for nausea, vomiting, abdominal pain, diarrhea and constipation.  Musculoskeletal: Negative for back pain, myalgias, neck pain and neck stiffness.  Skin: Negative for rash and wound.  Neurological: Negative for dizziness, seizures, syncope, weakness, light-headedness, numbness and headaches.  Psychiatric/Behavioral: Positive for suicidal ideas, self-injury and dysphoric mood.  All other systems reviewed and are negative.    Allergies  Dust mite extract and Procaine hcl  Home Medications   Current Outpatient Rx  Name  Route  Sig  Dispense  Refill  . ALPRAZolam (XANAX) 1 MG tablet      TAKE ONE TABLET BY MOUTH THREE TIMES DAILY    90 tablet   0   . atenolol (TENORMIN) 100 MG tablet   Oral   Take 100 mg by mouth at bedtime.         . cetirizine (ZYRTEC) 10 MG tablet   Oral   Take 10 mg by mouth daily.         . citalopram (CELEXA) 40 MG tablet   Oral   Take 40 mg by mouth at bedtime.         . meloxicam (MOBIC) 15 MG tablet   Oral   Take 15 mg by mouth daily.         [name]  ondansetron (ZOFRAN ODT) 4 MG disintegrating tablet   Oral   Take 1 tablet (4 mg total) by mouth every 8 (eight) hours as needed for nausea.   10 tablet   0   . promethazine (PHENERGAN) 25 MG tablet   Oral   Take 25 mg by mouth every 6 (six) hours as needed. nausea          BP 108/67  Pulse 93  Temp(Src) 97.8 F (36.6 C) (Oral)  Resp 33  Ht 5\' 6"  (1.676 m)  Wt 165 lb (74.844 kg)  BMI 26.64 kg/m2  SpO2 89% Physical Exam  Nursing note and vitals reviewed. Constitutional: She is oriented to person, place, and time. She appears well-developed and well-nourished. No distress.  Patient is drowsy but easily aroused.  HENT:  Head: Normocephalic and atraumatic.  Mouth/Throat: Oropharynx is clear and moist. No oropharyngeal exudate.  Eyes: EOM are normal. Pupils are  equal, round, and reactive to light.  Neck: Normal range of motion. Neck supple.  Cardiovascular: Normal rate and regular rhythm.   Pulmonary/Chest: Effort normal and breath sounds normal. No respiratory distress. She has no wheezes. She has no rales. She exhibits no tenderness.  Abdominal: Soft. Bowel sounds are normal. She exhibits no distension and no mass. There is no tenderness. There is no rebound and no guarding.  Musculoskeletal: Normal range of motion. She exhibits no edema and no tenderness.  Neurological: She is oriented to person, place, and time.  Slurred speech. 5/5 motor in all extremities. Sensation intact. Gag reflex intact.  Skin: Skin is warm and dry. No rash noted. No erythema.    ED Course  Procedures (including critical care time) Labs Review Labs Reviewed  URINE RAPID DRUG SCREEN (HOSP PERFORMED) - Abnormal; Notable for the following:    Benzodiazepines POSITIVE (*)    All other components within normal limits  CBC WITH DIFFERENTIAL  URINALYSIS, ROUTINE W REFLEX MICROSCOPIC  PREGNANCY, URINE  COMPREHENSIVE METABOLIC PANEL  ACETAMINOPHEN LEVEL  ETHANOL  SALICYLATE LEVEL   Imaging Review No results found.  EKG Interpretation    Date/Time:  Friday August 05 2013 23:14:54 EST Ventricular Rate:  93 PR Interval:  167 QRS Duration: 115 QT Interval:  380 QTC Calculation: 473 R Axis:   77 Text Interpretation:  Sinus rhythm Incomplete right bundle branch block Confirmed by Aboubacar Matsuo  MD, Annina Piotrowski ([id number]) on 08/06/2013 1:46:28 AM           CRITICAL CARE Performed by: Ranae Palms, Jeancarlo Leffler Total critical care time: 45 min Critical care time was exclusive of separately billable procedures and treating other patients. Critical care was necessary to treat or prevent imminent or life-threatening deterioration. Critical care was time spent personally by me on the following activities: development of treatment plan with patient and/or surrogate as well as nursing,  discussions with consultants, evaluation of patient's response to treatment, examination of patient, obtaining history from patient or surrogate, ordering and performing treatments and interventions, ordering and review of laboratory studies, ordering and review of radiographic studies, pulse oximetry and re-evaluation of patient's condition.  MDM   1. Suicide attempt by beta blocker overdose, initial encounter   2. Benzodiazepine (tranquilizer) overdose, initial encounter    Patient's blood pressure began to drop an emergency department. Lowest systolic pressure was 82. Discussed with poison control they recommend glucagon IV 3-5 mg. She was also given 2 L of IV normal saline. Improved blood pressure with systolic in the 90s. Patient is maintaining her airway and tolerating her secretions.  She's trowels issues he or rales. She satting mid 90s on 2 L of oxygen. Critical Care is in the emergency department evaluating the patient and will admit    Loren Racer, MD 08/06/13 [phone]

## 2013-08-06 NOTE — Progress Notes (Signed)
No orders received from Dr. Herma Carson, called Anders Simmonds NP with lab results.  Anders Simmonds NP to place some new orders. Continue to monitor.  Juliann Olesky Debroah Loop RN

## 2013-08-06 NOTE — Progress Notes (Addendum)
PULMONARY  / CRITICAL CARE MEDICINE HISTORY AND PHYSICAL EXAMINATION  Name: Lori Marshall MRN: [id number] DOB: 1975-12-14    ADMISSION DATE:  08/05/2013 CONSULTATION DATE: 08/06/2013  REFERRING SERVICE:  EDP PRIMARY SERVICE: PCCM  CHIEF COMPLAINT:  Overdose  BRIEF PATIENT DESCRIPTION: 2 F with intentional benzodiazepine / beta blocker overdose admitted on 12/5.  SIGNIFICANT EVENTS / STUDIES:   LINES / TUBES:  CULTURES:  ANTIBIOTICS: Flagyl 12/06 >>>  PHYSICAL EXAM  VITAL SIGNS: Temp:  [97.8 F (36.6 C)] 97.8 F (36.6 C) (12/05 2315) Pulse Rate:  [88-99] 89 (12/06 0800) Resp:  [14-33] 26 (12/06 0800) BP: (74-108)/(39-67) 97/54 mmHg (12/06 0800) SpO2:  [89 %-100 %] 92 % (12/06 0800) Weight:  [74.844 kg (165 lb)-83.3 kg (183 lb 10.3 oz)] 83.3 kg (183 lb 10.3 oz) (12/06 0214)  HEMODYNAMICS:   VENTILATOR SETTINGS:   INTAKE / OUTPUT: Intake/Output     12/05 0701 - 12/06 0700 12/06 0701 - 12/07 0700   I.V. (mL/kg) 404.3 (4.9)    Total Intake(mL/kg) 404.3 (4.9)    Urine (mL/kg/hr) 1100    Total Output 1100     Net -695.8            PHYSICAL EXAMINATION: General:  Obese F in NAD Neuro:  A+O x 2-3 (Definite Person, Time, unclear if Place). Selectively cooperative HEENT: Sclera anicteric, conjunctiva pink. MMM, OP Clear Cardiovascular:  RRR, NS1/S2, (-) MRG Lungs:  CTAB Abdomen:  S/Obese/NT/(+)BS Musculoskeletal:  (-) C/C/E Skin:  (-) Rash  LABS: CBC  Recent Labs Lab 08/05/13 2159 08/06/13 0340  WBC 8.9 9.1  HGB 12.8 11.5*  HCT 37.5 33.7*  PLT 300 276   Coag's No results found for this basename: APTT, INR,  in the last 168 hours BMET  Recent Labs Lab 08/05/13 2159 08/06/13 0340  NA 139 143  K 3.7 3.6  CL 104 107  CO2 22 25  BUN 10 10  CREATININE 0.52 0.52  GLUCOSE 110* 144*   Electrolytes  Recent Labs Lab 08/05/13 2159 08/06/13 0340  CALCIUM 8.3* 7.9*  MG  --  1.4*  PHOS  --  2.8   Sepsis Markers No results found for this  basename: LATICACIDVEN, PROCALCITON, O2SATVEN,  in the last 168 hours ABG  Recent Labs Lab 08/06/13 0240  PHART 7.320*  PCO2ART 47.9*  PO2ART 137.0*   Liver Enzymes  Recent Labs Lab 08/05/13 2159  AST 43*  ALT 53*  ALKPHOS 100  BILITOT <0.1*  ALBUMIN 3.4*   Cardiac Enzymes No results found for this basename: TROPONINI, PROBNP,  in the last 168 hours Glucose No results found for this basename: GLUCAP,  in the last 168 hours  ASSESSMENT / PLAN:  PULMONARY A:  Episodes apnea during sleep, no formal OSA Dx.  Protects airway. P Goal SpO2>92 Supplemental oxygen PRN May need polysomnography as outpatient  CARDIOVASCULAR A:  Betablocker overdose.  Transient hypotension. P Goal MAP > 60 D/c Glucagon gtt  RENAL A:  No acute issues. P: Trend BMP NS@125   GASTROINTESTINAL A:  Mildly elevated transaminases. P Trend LFTs D/c Protonix (GI Px is not indicated)  HEMATOLOGIC A:  No acute issues. P: Trend CBC  Heparin for DVT Px  INFECTIOUS A:  C.dif positive. P: Start Flagyl as above  ENDOCRINE A:  No active issues. P: No intervention required  NEUROLOGIC A:  Benzodiazepine overdose. P: Psych consult  Avoid sedative / hypnotics  I have personally obtained history, examined patient, evaluated and interpreted laboratory and imaging results, reviewed  medical records, formulated assessment / plan and placed orders.  CRITICAL CARE:  The patient is critically ill with multiple organ systems failure and requires high complexity decision making for assessment and support, frequent evaluation and titration of therapies, application of advanced monitoring technologies and extensive interpretation of multiple databases. Critical Care Time devoted to patient care services described in this note is 35 minutes.   Lonia Farber, MD Pulmonary and Critical Care Medicine [facility] Pager: [phone]  08/06/2013, 2:44 PM

## 2013-08-07 ENCOUNTER — Inpatient Hospital Stay (HOSPITAL_COMMUNITY): Payer: Medicaid Other

## 2013-08-07 DIAGNOSIS — R197 Diarrhea, unspecified: Secondary | ICD-10-CM

## 2013-08-07 DIAGNOSIS — G43909 Migraine, unspecified, not intractable, without status migrainosus: Secondary | ICD-10-CM

## 2013-08-07 DIAGNOSIS — F329 Major depressive disorder, single episode, unspecified: Secondary | ICD-10-CM

## 2013-08-07 DIAGNOSIS — R748 Abnormal levels of other serum enzymes: Secondary | ICD-10-CM

## 2013-08-07 DIAGNOSIS — T424X4A Poisoning by benzodiazepines, undetermined, initial encounter: Principal | ICD-10-CM

## 2013-08-07 DIAGNOSIS — T448X1A Poisoning by centrally-acting and adrenergic-neuron-blocking agents, accidental (unintentional), initial encounter: Secondary | ICD-10-CM

## 2013-08-07 DIAGNOSIS — T50992A Poisoning by other drugs, medicaments and biological substances, intentional self-harm, initial encounter: Secondary | ICD-10-CM

## 2013-08-07 DIAGNOSIS — F411 Generalized anxiety disorder: Secondary | ICD-10-CM

## 2013-08-07 DIAGNOSIS — T43591A Poisoning by other antipsychotics and neuroleptics, accidental (unintentional), initial encounter: Secondary | ICD-10-CM

## 2013-08-07 LAB — COMPREHENSIVE METABOLIC PANEL
ALT: 48 U/L — ABNORMAL HIGH (ref 0–35)
AST: 26 U/L (ref 0–37)
Albumin: 2.6 g/dL — ABNORMAL LOW (ref 3.5–5.2)
Alkaline Phosphatase: 59 U/L (ref 39–117)
BUN: 5 mg/dL — ABNORMAL LOW (ref 6–23)
CO2: 20 mEq/L (ref 19–32)
Calcium: 6.8 mg/dL — ABNORMAL LOW (ref 8.4–10.5)
Chloride: 108 mEq/L (ref 96–112)
Creatinine, Ser: 0.52 mg/dL (ref 0.50–1.10)
GFR calc Af Amer: >90 mL/min (ref >90)
GFR calc non Af Amer: >90 mL/min (ref >90)
Glucose, Bld: 145 mg/dL — ABNORMAL HIGH (ref 70–99)
Potassium: 4.2 mEq/L (ref 3.5–5.1)
Sodium: 136 mEq/L (ref 135–145)
Total Bilirubin: 0.2 mg/dL — ABNORMAL LOW (ref 0.3–1.2)
Total Protein: 4.9 g/dL — ABNORMAL LOW (ref 6.0–8.3)

## 2013-08-07 LAB — TROPONIN I
Troponin I: <0.3 ng/mL (ref <0.30)
Troponin I: <0.3 ng/mL (ref <0.30)

## 2013-08-07 LAB — CBC
HCT: 32.9 % — ABNORMAL LOW (ref 36.0–46.0)
Hemoglobin: 10.9 g/dL — ABNORMAL LOW (ref 12.0–15.0)
MCH: 30.7 pg (ref 26.0–34.0)
MCHC: 33.1 g/dL (ref 30.0–36.0)
MCV: 92.7 fL (ref 78.0–100.0)
Platelets: 295 10*3/uL (ref 150–400)
RBC: 3.55 MIL/uL — ABNORMAL LOW (ref 3.87–5.11)
RDW: 12 % (ref 11.5–15.5)
WBC: 11.1 10*3/uL — ABNORMAL HIGH (ref 4.0–10.5)

## 2013-08-07 LAB — MAGNESIUM: Magnesium: 1.7 mg/dL (ref 1.5–2.5)

## 2013-08-07 LAB — PHOSPHORUS: Phosphorus: 2.9 mg/dL (ref 2.3–4.6)

## 2013-08-07 LAB — CORTISOL: Cortisol, Plasma: 4.9 ug/dL

## 2013-08-07 MED ORDER — PHENYLEPHRINE HCL 10 MG/ML IJ SOLN
30.0000 ug/min | INTRAMUSCULAR | Status: DC
  Administered 2013-08-07: 30 ug/min via INTRAVENOUS
  Filled 2013-08-07 (×2): qty 1

## 2013-08-07 MED ORDER — LORAZEPAM 2 MG/ML IJ SOLN
2.0000 mg | INTRAMUSCULAR | Status: DC | PRN
  Administered 2013-08-07: 2 mg via INTRAVENOUS

## 2013-08-07 MED ORDER — SODIUM CHLORIDE 0.9 % IV BOLUS (SEPSIS)
500.0000 mL | Freq: Once | INTRAVENOUS | Status: AC
  Administered 2013-08-07: 500 mL via INTRAVENOUS

## 2013-08-07 MED ORDER — LORAZEPAM 2 MG/ML IJ SOLN
INTRAMUSCULAR | Status: AC
  Filled 2013-08-07: qty 1

## 2013-08-07 MED ORDER — SUMATRIPTAN SUCCINATE 6 MG/0.5ML ~~LOC~~ SOLN
6.0000 mg | Freq: Once | SUBCUTANEOUS | Status: AC
  Administered 2013-08-07: 6 mg via SUBCUTANEOUS
  Filled 2013-08-07: qty 0.5

## 2013-08-07 MED ORDER — SODIUM CHLORIDE 0.9 % IJ SOLN
10.0000 mL | INTRAMUSCULAR | Status: DC | PRN
  Administered 2013-08-08: 10 mL

## 2013-08-07 MED ORDER — PROCHLORPERAZINE EDISYLATE 5 MG/ML IJ SOLN
10.0000 mg | Freq: Four times a day (QID) | INTRAMUSCULAR | Status: DC | PRN
  Administered 2013-08-07: 10 mg via INTRAVENOUS
  Filled 2013-08-07: qty 2

## 2013-08-07 MED ORDER — SODIUM CHLORIDE 0.9 % IV BOLUS (SEPSIS)
1000.0000 mL | Freq: Once | INTRAVENOUS | Status: AC
  Administered 2013-08-07: 1000 mL via INTRAVENOUS

## 2013-08-07 MED ORDER — ALUM & MAG HYDROXIDE-SIMETH 200-200-20 MG/5ML PO SUSP
15.0000 mL | Freq: Four times a day (QID) | ORAL | Status: DC | PRN
  Administered 2013-08-07 – 2013-08-09 (×3): 15 mL via ORAL
  Filled 2013-08-07 (×3): qty 30

## 2013-08-07 MED ORDER — SUMATRIPTAN SUCCINATE 25 MG PO TABS
25.0000 mg | ORAL_TABLET | ORAL | Status: DC | PRN
  Administered 2013-08-07: 25 mg via ORAL
  Filled 2013-08-07: qty 1

## 2013-08-07 NOTE — Progress Notes (Signed)
   STill hypotenis ve MAP 54 despite 1L fluid bolus  Plan Start neo via PIV Ask Dr Luciano Cutter to place CVL: 500 cc fluid bolus   Dr. Kalman Shan, M.D., [facility].C.P Pulmonary and Critical Care Medicine Staff Physician [facility] Pager: [phone], If no answer or between  15:00h - 7:00h: call [phone]  08/07/2013 3:03 AM

## 2013-08-07 NOTE — Progress Notes (Signed)
PULMONARY  / CRITICAL CARE MEDICINE PROGRESS NOTE  Name: Lori Marshall MRN: [id number] DOB: [birth year]    ADMISSION DATE:  08/05/2013 CONSULTATION DATE: 08/06/2013  REFERRING SERVICE:  EDP PRIMARY SERVICE: PCCM  CHIEF COMPLAINT:  Overdose  BRIEF PATIENT DESCRIPTION: 60 F with intentional benzodiazepine / beta blocker overdose admitted on 12/5.  SIGNIFICANT EVENTS / STUDIES:  12/07  Psych consult requested  LINES / TUBES: R IJ TLC 12/06 >>>  CULTURES:  ANTIBIOTICS: Flagyl 12/06 >>>  INTERVAL HISTORY:  Reports migraine.  Diarrhea resolving. Transiently hypotensive overnight requiring CVL / Neo-Synephrine  PHYSICAL EXAM  VITAL SIGNS: Temp:  [97.2 F (36.2 C)-98.8 F (37.1 C)] 98.8 F (37.1 C) (12/07 0758) Pulse Rate:  [64-88] 88 (12/07 0830) Resp:  [13-32] 30 (12/07 0830) BP: (73-124)/(37-78) 93/59 mmHg (12/07 0830) SpO2:  [91 %-100 %] 95 % (12/07 0830) Weight:  [89.3 kg (196 lb 13.9 oz)] 89.3 kg (196 lb 13.9 oz) (12/07 0401)  HEMODYNAMICS:   VENTILATOR SETTINGS:   INTAKE / OUTPUT: Intake/Output     12/06 0701 - 12/07 0700 12/07 0701 - 12/08 0700   P.O. 1080    I.V. (mL/kg) 4012 (44.9) 147.5 (1.7)   IV Piggyback 2300    Total Intake(mL/kg) 7392 (82.8) 147.5 (1.7)   Urine (mL/kg/hr) 5775 (2.7)    Stool 12 (0)    Total Output 5787     Net +1605 +147.5          PHYSICAL EXAMINATION: General:  Obese F in NAD Neuro:  A+O x 3  HEENT: Sclera anicteric, conjunctiva pink. MMM, OP Clear Cardiovascular:  RRR, NS1/S2, (-) MRG Lungs:  CTAB Abdomen:  S/Obese/NT/(+)BS Musculoskeletal:  (-) C/C/E Skin:  (-) Rash  LABS: CBC  Recent Labs Lab 08/05/13 2159 08/06/13 0340 08/07/13 0358  WBC 8.9 9.1 11.1*  HGB 12.8 11.5* 10.9*  HCT 37.5 33.7* 32.9*  PLT 300 276 295   Coag's No results found for this basename: APTT, INR,  in the last 168 hours BMET  Recent Labs Lab 08/05/13 2159 08/06/13 0340 08/07/13 0358  NA 139 143 136  K 3.7 3.6 4.2  CL 104 107  108  CO2 22 25 20   BUN 10 10 5*  CREATININE 0.52 0.52 0.52  GLUCOSE 110* 144* 145*   Electrolytes  Recent Labs Lab 08/05/13 2159 08/06/13 0340 08/07/13 0358  CALCIUM 8.3* 7.9* 6.8*  MG  --  1.4* 1.7  PHOS  --  2.8 2.9   Sepsis Markers No results found for this basename: LATICACIDVEN, PROCALCITON, O2SATVEN,  in the last 168 hours ABG  Recent Labs Lab 08/06/13 0240  PHART 7.320*  PCO2ART 47.9*  PO2ART 137.0*   Liver Enzymes  Recent Labs Lab 08/05/13 2159 08/07/13 0358  AST 43* 26  ALT 53* 48*  ALKPHOS 100 59  BILITOT <0.1* 0.2*  ALBUMIN 3.4* 2.6*   Cardiac Enzymes  Recent Labs Lab 08/07/13 0358  TROPONINI <0.30   Glucose No results found for this basename: GLUCAP,  in the last 168 hours  ASSESSMENT / PLAN:  PULMONARY A:  Episodes apnea during sleep, no formal OSA Dx.  Protects airway. P Goal SpO2>92 Supplemental oxygen PRN May need polysomnography as outpatient  CARDIOVASCULAR A:  Baseline hypotension.  Beta-blocker overdose.  Transient hypotension, likely hypovolemia in setting of diarrhea, doubt shock ( warm, maling urine, mentating ). P Goal systolic > 90 Wean Neo-Synephrine  RENAL A:  Hypovolemia. P: Trend BMP NS 1000 x 1 Increase NS@150   GASTROINTESTINAL A:  Mildly  elevated transaminases. P Trend LFTs  HEMATOLOGIC A:  No acute issues. P: Trend CBC  Heparin for DVT Px  INFECTIOUS A:  C.dif positive. P: Started Flagyl as above  ENDOCRINE A:  No active issues. P: No intervention required  NEUROLOGIC A:  Benzodiazepine overdose, risk of withdrawal.  Anxiety.  Migraine headache. P: Psych consult  Ativan PRN Imitrex PRN  BABCOCK,PETE, ACNP-BC Pulmonary and Critical Care Medicine [facility] Pager: [phone]  08/07/2013, 9:04 AM  I have personally obtained history, examined patient, evaluated and interpreted laboratory and imaging results, reviewed medical records, formulated assessment / plan and placed  orders.  CRITICAL CARE:  The patient is critically ill with multiple organ systems failure and requires high complexity decision making for assessment and support, frequent evaluation and titration of therapies, application of advanced monitoring technologies and extensive interpretation of multiple databases. Critical Care Time devoted to patient care services described in this note is 35 minutes.   Lonia Farber, MD Pulmonary and Critical Care Medicine [facility] Pager: [phone]  08/07/2013, 11:47 AM

## 2013-08-07 NOTE — Consult Note (Signed)
[facility] Face-to-Face Psychiatry Consult   Reason for Consult:  Polysubstance abuse and overdose Referring Physician:  Anders Simmonds  NP [name] is an 37 y.o. female.  Assessment: AXIS I:  Polysubstance abuse, suicide attempt,  AXIS II:  Deferred AXIS III:   Past Medical History  Diagnosis Date  . Migraine, unspecified, without mention of intractable migraine without mention of status migrainosus   . Vomiting alone   . Irritable bowel syndrome   . Esophageal reflux   . Diarrhea   . Panic disorder without agoraphobia   . Hypothyroid   . Depressive disorder, not elsewhere classified   . Depression   . Anxiety    AXIS IV:  occupational problems and other psychosocial or environmental problems AXIS V:  41-50 serious symptoms  Plan:  Recommend psychiatric Inpatient admission when medically cleared. 1. IVC for safety. 2. Continue 1:1 for safety. 3. Will admit to [facility] for stabilization and medication management upon discharge and bed availability.  Subjective:   Lori Marshall is a 37 y.o. female patient admitted with overdose of beta blocker and alcohol intoxication. She is sleepy and drowsy and not a reliable historian.       She reports that she was tired and accidentally took her beta blockers instead of her depression medication. She denied this as a suicide attempt and stated that she had not been drinking and was not intoxicated when the event happened.     She reported only one previous suicide attempt in 2011 when she cut her wrists.  She has a history of depression and anxiety       Prior to her admission she denies any increase in anxiety or depression, denies suicidal ideation, or AVH.  HPI:  As noted above. HPI Elements:   Location:  Step down unit [employer]. Quality:  poor. Severity:  life threatening. Timing:  <72 hours. Duration:  unknowns. Context:  patient is a poor historian with a history of previous admission for suicide attempt.  Past Psychiatric  History: Past Medical History  Diagnosis Date  . Migraine, unspecified, without mention of intractable migraine without mention of status migrainosus   . Vomiting alone   . Irritable bowel syndrome   . Esophageal reflux   . Diarrhea   . Panic disorder without agoraphobia   . Hypothyroid   . Depressive disorder, not elsewhere classified   . Depression   . Anxiety     reports that she has been smoking Cigarettes.  She has a 2.5 pack-year smoking history. She has never used smokeless tobacco. She reports that she does not drink alcohol or use illicit drugs. Family History  Problem Relation Age of Onset  . Diabetes Father   . Melanoma Father   . Colon polyps Father   . Colon cancer Neg Hx      Living Arrangements: Parent   Abuse/Neglect [facility]) Physical Abuse: Denies Verbal Abuse: Denies Sexual Abuse: Denies Allergies:   Allergies  Allergen Reactions  . Dust Mite Extract Swelling  . Medrol [Methylprednisolone]   . Novocain [Procaine]     unknown  . Procaine Hcl Palpitations    ACT Assessment Complete:  No:   Past Psychiatric History: Diagnosis:  MDD poly substance abuse, alcohol intoxication  Hospitalizations:  [facility] 2012  Outpatient Care:  none  Substance Abuse Care:  none  Self-Mutilation:  denies  Suicidal Attempts:  2 previous  Homicidal Behaviors:  none   Violent Behaviors:  none   Place of Residence:  [place]  Marital Status:  Single Employed/Unemployed:  Insurance agent Education:  Oncologist Family Supports:  parents Objective: Blood pressure 107/72, pulse 85, temperature 98.5 F (36.9 C), temperature source Oral, resp. rate 20, height 5\' 6"  (1.676 m), weight 89.3 kg (196 lb 13.9 oz), SpO2 93.00%.Body mass index is 31.79 kg/(m^2). Results for orders placed during the hospital encounter of 08/05/13 (from the past 72 hour(s))  CBC WITH DIFFERENTIAL     Status: None   Collection Time    08/05/13  9:59 PM      Result Value Range   WBC 8.9  4.0 -  10.5 K/uL   RBC 4.12  3.87 - 5.11 MIL/uL   Hemoglobin 12.8  12.0 - 15.0 g/dL   HCT 16.1  09.6 - 04.5 %   MCV 91.0  78.0 - 100.0 fL   MCH 31.1  26.0 - 34.0 pg   MCHC 34.1  30.0 - 36.0 g/dL   RDW 40.9  81.1 - 91.4 %   Platelets 300  150 - 400 K/uL   Neutrophils Relative % 60  43 - 77 %   Neutro Abs 5.3  1.7 - 7.7 K/uL   Lymphocytes Relative 31  12 - 46 %   Lymphs Abs 2.7  0.7 - 4.0 K/uL   Monocytes Relative 8  3 - 12 %   Monocytes Absolute 0.7  0.1 - 1.0 K/uL   Eosinophils Relative 2  0 - 5 %   Eosinophils Absolute 0.2  0.0 - 0.7 K/uL   Basophils Relative 0  0 - 1 %   Basophils Absolute 0.0  0.0 - 0.1 K/uL  COMPREHENSIVE METABOLIC PANEL     Status: Abnormal   Collection Time    08/05/13  9:59 PM      Result Value Range   Sodium 139  135 - 145 mEq/L   Potassium 3.7  3.5 - 5.1 mEq/L   Chloride 104  96 - 112 mEq/L   CO2 22  19 - 32 mEq/L   Glucose, Bld 110 (*) 70 - 99 mg/dL   BUN 10  6 - 23 mg/dL   Creatinine, Ser 7.82  0.50 - 1.10 mg/dL   Calcium 8.3 (*) 8.4 - 10.5 mg/dL   Total Protein 6.3  6.0 - 8.3 g/dL   Albumin 3.4 (*) 3.5 - 5.2 g/dL   AST 43 (*) 0 - 37 U/L   ALT 53 (*) 0 - 35 U/L   Alkaline Phosphatase 100  39 - 117 U/L   Total Bilirubin <0.1 (*) 0.3 - 1.2 mg/dL   GFR calc non Af Amer >90  >90 mL/min   GFR calc Af Amer >90  >90 mL/min   Comment: (NOTE)     The eGFR has been calculated using the CKD EPI equation.     This calculation has not been validated in all clinical situations.     eGFR's persistently <90 mL/min signify possible Chronic Kidney     Disease.  ACETAMINOPHEN LEVEL     Status: None   Collection Time    08/05/13  9:59 PM      Result Value Range   Acetaminophen (Tylenol), Serum <15.0  10 - 30 ug/mL   Comment:            THERAPEUTIC CONCENTRATIONS VARY     SIGNIFICANTLY. A RANGE OF 10-30     ug/mL MAY BE AN EFFECTIVE     CONCENTRATION FOR MANY PATIENTS.     HOWEVER, SOME ARE BEST TREATED  AT CONCENTRATIONS OUTSIDE THIS     RANGE.      ACETAMINOPHEN CONCENTRATIONS     >150 ug/mL AT 4 HOURS AFTER     INGESTION AND >50 ug/mL AT 12     HOURS AFTER INGESTION ARE     OFTEN ASSOCIATED WITH TOXIC     REACTIONS.  ETHANOL     Status: Abnormal   Collection Time    08/05/13  9:59 PM      Result Value Range   Alcohol, Ethyl (B) 115 (*) 0 - 11 mg/dL   Comment:            LOWEST DETECTABLE LIMIT FOR     SERUM ALCOHOL IS 11 mg/dL     FOR MEDICAL PURPOSES ONLY  SALICYLATE LEVEL     Status: Abnormal   Collection Time    08/05/13  9:59 PM      Result Value Range   Salicylate Lvl <2.0 (*) 2.8 - 20.0 mg/dL  URINALYSIS, ROUTINE W REFLEX MICROSCOPIC     Status: None   Collection Time    08/05/13 11:43 PM      Result Value Range   Color, Urine YELLOW  YELLOW   APPearance CLEAR  CLEAR   Specific Gravity, Urine 1.006  1.005 - 1.030   pH 5.5  5.0 - 8.0   Glucose, UA NEGATIVE  NEGATIVE mg/dL   Hgb urine dipstick NEGATIVE  NEGATIVE   Bilirubin Urine NEGATIVE  NEGATIVE   Ketones, ur NEGATIVE  NEGATIVE mg/dL   Protein, ur NEGATIVE  NEGATIVE mg/dL   Urobilinogen, UA 0.2  0.0 - 1.0 mg/dL   Nitrite NEGATIVE  NEGATIVE   Leukocytes, UA NEGATIVE  NEGATIVE   Comment: MICROSCOPIC NOT DONE ON URINES WITH NEGATIVE PROTEIN, BLOOD, LEUKOCYTES, NITRITE, OR GLUCOSE <1000 mg/dL.  PREGNANCY, URINE     Status: None   Collection Time    08/05/13 11:43 PM      Result Value Range   Preg Test, Ur NEGATIVE  NEGATIVE   Comment:            THE SENSITIVITY OF THIS     METHODOLOGY IS >20 mIU/mL.  URINE RAPID DRUG SCREEN (HOSP PERFORMED)     Status: Abnormal   Collection Time    08/05/13 11:43 PM      Result Value Range   Opiates NONE DETECTED  NONE DETECTED   Cocaine NONE DETECTED  NONE DETECTED   Benzodiazepines POSITIVE (*) NONE DETECTED   Amphetamines NONE DETECTED  NONE DETECTED   Tetrahydrocannabinol NONE DETECTED  NONE DETECTED   Barbiturates NONE DETECTED  NONE DETECTED   Comment:            DRUG SCREEN FOR MEDICAL PURPOSES     ONLY.  IF  CONFIRMATION IS NEEDED     FOR ANY PURPOSE, NOTIFY LAB     WITHIN 5 DAYS.                LOWEST DETECTABLE LIMITS     FOR URINE DRUG SCREEN     Drug Class       Cutoff (ng/mL)     Amphetamine      1000     Barbiturate      200     Benzodiazepine   200     Tricyclics       300     Opiates          300     Cocaine  300     THC              50  MRSA PCR SCREENING     Status: None   Collection Time    08/06/13  2:18 AM      Result Value Range   MRSA by PCR NEGATIVE  NEGATIVE   Comment:            The GeneXpert MRSA Assay (FDA     approved for NASAL specimens     only), is one component of a     comprehensive MRSA colonization     surveillance program. It is not     intended to diagnose MRSA     infection nor to guide or     monitor treatment for     MRSA infections.  BLOOD GAS, ARTERIAL     Status: Abnormal   Collection Time    08/06/13  2:40 AM      Result Value Range   O2 Content 3.0     Delivery systems NASAL CANNULA     pH, Arterial 7.320 (*) 7.350 - 7.450   pCO2 arterial 47.9 (*) 35.0 - 45.0 mmHg   pO2, Arterial 137.0 (*) 80.0 - 100.0 mmHg   Bicarbonate 24.0  20.0 - 24.0 mEq/L   TCO2 22.2  0 - 100 mmol/L   Acid-base deficit 1.8  0.0 - 2.0 mmol/L   O2 Saturation 98.0     Patient temperature 37.0     Collection site BRACHIAL ARTERY     Drawn by [id number]     Sample type ARTERIAL DRAW    CBC     Status: Abnormal   Collection Time    08/06/13  3:40 AM      Result Value Range   WBC 9.1  4.0 - 10.5 K/uL   RBC 3.71 (*) 3.87 - 5.11 MIL/uL   Hemoglobin 11.5 (*) 12.0 - 15.0 g/dL   HCT 91.4 (*) 78.2 - 95.6 %   MCV 90.8  78.0 - 100.0 fL   MCH 31.0  26.0 - 34.0 pg   MCHC 34.1  30.0 - 36.0 g/dL   RDW 21.3  08.6 - 57.8 %   Platelets 276  150 - 400 K/uL  BASIC METABOLIC PANEL     Status: Abnormal   Collection Time    08/06/13  3:40 AM      Result Value Range   Sodium 143  135 - 145 mEq/L   Potassium 3.6  3.5 - 5.1 mEq/L   Chloride 107  96 - 112 mEq/L   CO2 25   19 - 32 mEq/L   Glucose, Bld 144 (*) 70 - 99 mg/dL   BUN 10  6 - 23 mg/dL   Creatinine, Ser 4.69  0.50 - 1.10 mg/dL   Calcium 7.9 (*) 8.4 - 10.5 mg/dL   GFR calc non Af Amer >90  >90 mL/min   GFR calc Af Amer >90  >90 mL/min   Comment: (NOTE)     The eGFR has been calculated using the CKD EPI equation.     This calculation has not been validated in all clinical situations.     eGFR's persistently <90 mL/min signify possible Chronic Kidney     Disease.  MAGNESIUM     Status: Abnormal   Collection Time    08/06/13  3:40 AM      Result Value Range   Magnesium 1.4 (*) 1.5 - 2.5 mg/dL  PHOSPHORUS  Status: None   Collection Time    08/06/13  3:40 AM      Result Value Range   Phosphorus 2.8  2.3 - 4.6 mg/dL  CLOSTRIDIUM DIFFICILE BY PCR     Status: Abnormal   Collection Time    08/06/13 10:05 AM      Result Value Range   C difficile by pcr POSITIVE (*) NEGATIVE   Comment: CRITICAL RESULT CALLED TO, READ BACK BY AND VERIFIED WITH:     C.BONGEL,MLT 1310 08/06/13 M.CAMPBELL     CRITICAL RESULT CALLED TO, READ BACK BY AND VERIFIED WITH:     A.ARNOLD RN AT 1313 ON [id number] BY C.BONGEL  CBC     Status: Abnormal   Collection Time    08/07/13  3:58 AM      Result Value Range   WBC 11.1 (*) 4.0 - 10.5 K/uL   RBC 3.55 (*) 3.87 - 5.11 MIL/uL   Hemoglobin 10.9 (*) 12.0 - 15.0 g/dL   HCT 96.0 (*) 45.4 - 09.8 %   MCV 92.7  78.0 - 100.0 fL   MCH 30.7  26.0 - 34.0 pg   MCHC 33.1  30.0 - 36.0 g/dL   RDW 11.9  14.7 - 82.9 %   Platelets 295  150 - 400 K/uL  COMPREHENSIVE METABOLIC PANEL     Status: Abnormal   Collection Time    08/07/13  3:58 AM      Result Value Range   Sodium 136  135 - 145 mEq/L   Comment: DELTA CHECK NOTED   Potassium 4.2  3.5 - 5.1 mEq/L   Chloride 108  96 - 112 mEq/L   CO2 20  19 - 32 mEq/L   Glucose, Bld 145 (*) 70 - 99 mg/dL   BUN 5 (*) 6 - 23 mg/dL   Creatinine, Ser 5.62  0.50 - 1.10 mg/dL   Calcium 6.8 (*) 8.4 - 10.5 mg/dL   Total Protein 4.9 (*) 6.0 - 8.3  g/dL   Albumin 2.6 (*) 3.5 - 5.2 g/dL   AST 26  0 - 37 U/L   ALT 48 (*) 0 - 35 U/L   Alkaline Phosphatase 59  39 - 117 U/L   Total Bilirubin 0.2 (*) 0.3 - 1.2 mg/dL   GFR calc non Af Amer >90  >90 mL/min   GFR calc Af Amer >90  >90 mL/min   Comment: (NOTE)     The eGFR has been calculated using the CKD EPI equation.     This calculation has not been validated in all clinical situations.     eGFR's persistently <90 mL/min signify possible Chronic Kidney     Disease.  CORTISOL     Status: None   Collection Time    08/07/13  3:58 AM      Result Value Range   Cortisol, Plasma 4.9     Comment: (NOTE)     AM:  4.3 - 22.4 ug/dL     PM:  3.1 - 13.0 ug/dL     Performed at [facility]  TROPONIN I     Status: None   Collection Time    08/07/13  3:58 AM      Result Value Range   Troponin I <0.30  <0.30 ng/mL   Comment:            Due to the release kinetics of cTnI,     a negative result within the first hours     of  the onset of symptoms does not rule out     myocardial infarction with certainty.     If myocardial infarction is still suspected,     repeat the test at appropriate intervals.  MAGNESIUM     Status: None   Collection Time    08/07/13  3:58 AM      Result Value Range   Magnesium 1.7  1.5 - 2.5 mg/dL  PHOSPHORUS     Status: None   Collection Time    08/07/13  3:58 AM      Result Value Range   Phosphorus 2.9  2.3 - 4.6 mg/dL  TROPONIN I     Status: None   Collection Time    08/07/13  2:20 PM      Result Value Range   Troponin I <0.30  <0.30 ng/mL   Comment:            Due to the release kinetics of cTnI,     a negative result within the first hours     of the onset of symptoms does not rule out     myocardial infarction with certainty.     If myocardial infarction is still suspected,     repeat the test at appropriate intervals.   Labs are reviewed and are pertinent for UDS + for alcohol and benzodiazepines.  Current Facility-Administered Medications   Medication Dose Route Frequency Provider Last Rate Last Dose  . 0.9 %  sodium chloride infusion   Intravenous Continuous Lonia Farber, MD 150 mL/hr at 08/07/13 2120    . alum & mag hydroxide-simeth (MAALOX/MYLANTA) 200-200-20 MG/5ML suspension 15 mL  15 mL Oral Q6H PRN Deanna Artis, MD   15 mL at 08/07/13 1853  . heparin injection 5,000 Units  5,000 Units Subcutaneous Q8H Jenelle Mages, MD   5,000 Units at 08/07/13 2119  . LORazepam (ATIVAN) 2 MG/ML injection           . metroNIDAZOLE (FLAGYL) tablet 500 mg  500 mg Oral Q8H Simonne Martinet, NP   500 mg at 08/07/13 2119  . sodium chloride 0.9 % injection 10-40 mL  10-40 mL Intracatheter PRN Storm Frisk, MD        Psychiatric Specialty Exam:     Blood pressure 107/72, pulse 85, temperature 98.5 F (36.9 C), temperature source Oral, resp. rate 20, height 5\' 6"  (1.676 m), weight 89.3 kg (196 lb 13.9 oz), SpO2 93.00%.Body mass index is 31.79 kg/(m^2).  General Appearance: drowsy and lethargic  Eye Contact::  Minimal  Speech:  Clear and Coherent  Volume:  Normal  Mood:  Anxious and Depressed  Affect:  Congruent  Thought Process:  Goal Directed  Orientation:  Full (Time, Place, and Person)  Thought Content:  WDL  Suicidal Thoughts:  denies  Homicidal Thoughts:  denies  Memory:  poor  Judgement:  Impaired  Insight:  Lacking  Psychomotor Activity:  Decreased  Concentration:  Poor  Recall:  Poor  Akathisia:  No  Handed:  Right  AIMS (if indicated):     Assets:  Communication Skills Desire for Improvement Housing Physical Health  Sleep:      Treatment Plan Summary: Daily contact with patient to assess and evaluate symptoms and progress in treatment Medication management Admit to [facility] upon discharge when medically stable and bed is available. Thank you for allowing [place] to participate in the care of this nice young woman.  11:22 PM 08/07/2013  I agreed with the findings, treatment  and disposition plan of this  patient. Kathryne Sharper, MD

## 2013-08-07 NOTE — Progress Notes (Signed)
Pt received in rm 1517. Arrived on unit in wheelchair pushed by nurse tech accompanied by mother. Arrived in good condition. Pt requesting something for heart burn. MD notified. York Spaniel will put in new orders. Ofilia Neas

## 2013-08-07 NOTE — Progress Notes (Signed)
RN says headache c/w prior migrain  Plan Sumatriptan 6mg  sq x 1 Check mag, phos, trop  Dr. Kalman Shan, M.D., [facility].C.P Pulmonary and Critical Care Medicine Staff Physician [facility] [place] [facility] Pager: [phone], If no answer or between  15:00h - 7:00h: call [phone]  08/07/2013 5:59 AM

## 2013-08-07 NOTE — Progress Notes (Signed)
    Hypotension persistent . Beta blocker OD /sp glucagon until 9am 08/06/13 for 10h and COm Acquired C Dif C Diff; diarrhea x 11 times past 24h  N saline at 157ml/h ongoing   Recent Labs Lab 08/05/13 2159 08/06/13 0340  CREATININE 0.52 0.52    I/O last 3 completed shifts: In: 3233.8 [P.O.:600; I.V.:1833.8; IV Piggyback:800] Out: 5212 [Urine:5200; Stool:12]   Net + 370cc since admit  A Likely vlume depletion at this stage  PLAN 1 L fluid bolus x 30 minutes RN to call back MAP goal > 65

## 2013-08-07 NOTE — Procedures (Signed)
Central Venous Catheter Insertion Procedure Note  Procedure: Insertion of Central Venous Catheter  Indications:  vascular access, hypotension  Procedure Details  Informed consent was obtained for the procedure, including sedation.  Risks of lung perforation, hemorrhage, arrhythmia, and adverse drug reaction were discussed.   Maximum sterile technique was used including antiseptics, cap, gloves, gown, hand hygiene, mask and sheet.  Under sterile conditions the skin above the on the right internal jugular vein was prepped with betadine and covered with a sterile drape. Local anesthesia was applied to the skin and subcutaneous tissues. An 18-gauge needle was then inserted into the vein under ultrasound guidance. A guide wire was then passed easily through the catheter and location confirmed in the vessel. There was ventricular tachycardia requiring adjustment of the wire. The catheter was then withdrawn. A 8.0 French triple-lumen was then inserted into the vessel over the guide wire. The catheter was sutured into place.  I used ultrasound to locate and access the vein/artery.  Findings: There were no changes to vital signs. Catheter was flushed with 10 cc NS. Patient did tolerate procedure well.  Recommendations: CXR ordered to verify placement and is pending.

## 2013-08-08 ENCOUNTER — Telehealth: Payer: Self-pay | Admitting: Internal Medicine

## 2013-08-08 ENCOUNTER — Ambulatory Visit: Payer: Self-pay | Admitting: Emergency Medicine

## 2013-08-08 DIAGNOSIS — A0472 Enterocolitis due to Clostridium difficile, not specified as recurrent: Secondary | ICD-10-CM

## 2013-08-08 MED ORDER — SUMATRIPTAN SUCCINATE 25 MG PO TABS
25.0000 mg | ORAL_TABLET | ORAL | Status: DC | PRN
  Administered 2013-08-08 – 2013-08-11 (×6): 25 mg via ORAL
  Filled 2013-08-08 (×7): qty 1

## 2013-08-08 MED ORDER — ALPRAZOLAM 1 MG PO TABS
1.0000 mg | ORAL_TABLET | Freq: Three times a day (TID) | ORAL | Status: DC | PRN
  Administered 2013-08-08 – 2013-08-11 (×7): 1 mg via ORAL
  Filled 2013-08-08 (×7): qty 1

## 2013-08-08 NOTE — Progress Notes (Addendum)
PULMONARY  / CRITICAL CARE MEDICINE PROGRESS NOTE  Name: Lori Marshall MRN: [id number] DOB: Dec 06, 1975    ADMISSION DATE:  08/05/2013 CONSULTATION DATE: 08/06/2013  REFERRING SERVICE:  EDP PRIMARY SERVICE: PCCM  CHIEF COMPLAINT:  Overdose  BRIEF PATIENT DESCRIPTION: 55 F with intentional benzodiazepine / beta blocker overdose admitted on 12/5.  SIGNIFICANT EVENTS / STUDIES:  12/07  Psych consult requested  LINES / TUBES: R IJ TLC 12/06 >>>12/8  CULTURES:  ANTIBIOTICS: Flagyl 12/06 >>>  INTERVAL HISTORY:  No more diarrhea  PHYSICAL EXAM  VITAL SIGNS: Temp:  [98.5 F (36.9 C)-99.2 F (37.3 C)] 98.6 F (37 C) (12/08 0537) Pulse Rate:  [54-101] 70 (12/08 0537) Resp:  [16-30] 20 (12/08 0537) BP: (95-117)/(52-76) 111/71 mmHg (12/08 0537) SpO2:  [91 %-100 %] 97 % (12/08 0537) Weight:  [84.868 kg (187 lb 1.6 oz)] 84.868 kg (187 lb 1.6 oz) (12/08 0503)  HEMODYNAMICS:   VENTILATOR SETTINGS:   INTAKE / OUTPUT:  Intake/Output Summary (Last 24 hours) at 08/08/13 1151 Last data filed at 08/08/13 1041  Gross per 24 hour  Intake 1607.5 ml  Output   3300 ml  Net -1692.5 ml     PHYSICAL EXAMINATION: General:  Obese F in NAD Neuro:  A+O x 3  HEENT: Sclera anicteric, conjunctiva pink. MMM, OP Clear Cardiovascular:  RRR, NS1/S2, (-) MRG Lungs:  CTAB Abdomen:  S/Obese/NT/(+)BS Musculoskeletal:  (-) C/C/E Skin:  (-) Rash   Recent Labs Lab 08/05/13 2159 08/06/13 0340 08/07/13 0358  NA 139 143 136  K 3.7 3.6 4.2  CL 104 107 108  CO2 22 25 20   BUN 10 10 5*  CREATININE 0.52 0.52 0.52  GLUCOSE 110* 144* 145*    Recent Labs Lab 08/05/13 2159 08/06/13 0340 08/07/13 0358  HGB 12.8 11.5* 10.9*  HCT 37.5 33.7* 32.9*  WBC 8.9 9.1 11.1*  PLT 300 276 295    ASSESSMENT / PLAN:  Benzodiazepine overdose, risk of withdrawal.  Anxiety.  Migraine headache. P: Psych consulted recommends in-pt  Resume low dose xanax  Imitrex PRN  Baseline hypotension.   Beta-blocker overdose.  Transient hypotension, likely hypovolemia in setting of diarrhea, doubt shock ( warm, maling urine, mentating ). P Goal systolic > 90  C.dif positive. P: Started Flagyl as above, complete 10d total   Relative adrenal insufficiency - cortisol 4.9 Advise repeating this as outpt & if remains symptomatic, consider adrenal stim testing  BABCOCK,PETE, ACNP-BC   08/08/2013, 11:04 AM  She is ready for d/c. Psych recommends in-pt setting.  Independently examined pt, evaluated data & formulated above care plan with NP who scribed this note & edited by me.  Cyril Mourning MD. Tonny Bollman. [place] [facility] Pager [phone] If no response call [phone]

## 2013-08-08 NOTE — Telephone Encounter (Signed)
Pt in Intensive Care at [facility].  Mother wanted you to know

## 2013-08-08 NOTE — Progress Notes (Signed)
Clinical Social Work Department CLINICAL SOCIAL WORK PSYCHIATRY SERVICE LINE ASSESSMENT 08/08/2013  Patient:  Lori Marshall  Account:  [id number]  Admit Date:  08/05/2013  Clinical Social Worker:  Unk Lightning, LCSW  Date/Time:  08/08/2013 04:30 PM Referred by:  Physician  Date referred:  08/08/2013 Reason for Referral  Psychosocial assessment   Presenting Symptoms/Problems (In the person's/family's own words):   Psych consulted due to suicide attempt.   Abuse/Neglect/Trauma History (check all that apply)  Denies history   Abuse/Neglect/Trauma Comments:   Psychiatric History (check all that apply)  Outpatient treatment  Inpatient/hospitilization   Psychiatric medications:  Xanax 1 mg  Ativan 2 mg   Current Mental Health Hospitalizations/Previous Mental Health History:   Patient has been diagnosed with depression and has been hospitalized in the past. Patient is not compliant with outpatient follow up and PCP prescribes medication.   Current provider:   PCP   Place and Date:   [place], [place]   Current Medications:   Scheduled Meds:      . heparin  5,000 Units Subcutaneous Q8H  . metroNIDAZOLE  500 mg Oral Q8H        Continuous Infusions:      . sodium chloride 150 mL/hr at 08/08/13 0849          PRN Meds:.ALPRAZolam, alum & mag hydroxide-simeth, sodium chloride, SUMAtriptan       Previous Impatient Admission/Date/Reason:   [facility] in 2012   Emotional Health / Current Symptoms    Suicide/Self Harm  Suicide attempt in past (date/description)   Suicide attempt in the past:   Patient has attempted suicide in the past and was admitted to overdose attempt. Patient denies any current SI.   Other harmful behavior:   None reported   Psychotic/Dissociative Symptoms  None reported   Other Psychotic/Dissociative Symptoms:   N/A    Attention/Behavioral Symptoms  Within Normal Limits   Other Attention / Behavioral Symptoms:   Patient engaged during assessment.     Cognitive Impairment  Within Normal Limits   Other Cognitive Impairment:   Patient alert and oriented.    Mood and Adjustment  Flat    Stress, Anxiety, Trauma, Any Recent Loss/Stressor  None reported   Anxiety (frequency):   N/A   Phobia (specify):   N/A   Compulsive behavior (specify):   N/A   Obsessive behavior (specify):   N/A   Other:   N/A   Substance Abuse/Use  Current substance use   SBIRT completed (please refer for detailed history):  N  Self-reported substance use:   Patient vague about substance use and did not want to complete SBIRT. Patient did report she was drinking on day of admission. Patient is not interested in SA tx.   Urinary Drug Screen Completed:  Y Alcohol level:   115    Environmental/Housing/Living Arrangement  Stable housing   Who is in the home:   Parents   Emergency contact:  B.G.-mom   Financial  IPRS   Patient's Strengths and Goals (patient's own words):   Patient has supportive mom.   Clinical Social Worker's Interpretive Summary:   CSW received referral to complete psychosocial assessment. CSW reviewed chart which stated that psych MD recommends inpatient treatment. CSW met with patient and mom at bedside.    CSW spoke with patient regarding admission and patient admits that she does not follow up with outpatient appointments and has felt overwhelmed lately. Patient aware that she will need inpatient treatment but is not thrilled about  being admitted. Patient was placed under IVC treatment per psych MD request. CSW explained search process and explained that CSW would keep patient updated on facility.    CSW spoke with [facility] who accepted patient under Dr. Felipa Furnace. CSW spoke with sheriff who reports they cannot transport until 08/09/13. Hospital agreeable to plan and asked RN to call report in the morning. CSW shared this information with RN and MD and placed sticky note in EPIC.    When [employer] served IVC paperwork,  [employer] completed paperwork incorrectly. CSW called [employer] who reported that officer would correct paperwork but sheriff reports they are unable to accept paperwork if any corrections need to be made. CSW staffed case with [employer] and resubmitted forms to [employer] Magistrate reports they will ask [employer] to re-serve patient's paperwork.    CSW provided hospital information for patient. Patient agreeable to go to hospital and will have mom bring supplies for stay. Patient thanked CSW for information and has contact information if further questions arise.   Disposition:  Inpatient referral made [facility], [facility], Geri-psych)   [place], [place] [phone]

## 2013-08-08 NOTE — Significant Event (Signed)
Pt noted to have tenderness on hand in which IV site was previously placed.  Advised nurse to place compress and will monitor.  Coralyn Helling, MD 08/08/2013, 9:48 PM

## 2013-08-08 NOTE — Progress Notes (Signed)
Patient and mother given instruction in care of right IJ site post CL removal via teachback method including what to do if bleeding occurs and when to remove dressing. Lori Marshall, Lajean Manes

## 2013-08-08 NOTE — Progress Notes (Signed)
Dr Craige Cotta made aware of patient right hand minimal swelling and redness to right arm.  orders given to place warm compress to patient arm and monitor.

## 2013-08-08 NOTE — Progress Notes (Signed)
Physician Discharge Summary      INTERIM DISCHARGE SUMMARY  Patient ID: Lori Marshall MRN: [id number] DOB/AGE: [birth year] 37 y.o.  Admit date: 08/05/2013 Discharge  date: 08/08/2013  Interim Discharge Diagnoses:  Principal Problem:   Suicide attempt by beta blocker overdose Active Problems:   DEPRESSION   Benzodiazepine overdose   Elevated liver enzymes   Overdose  Detailed Hospital Course:   Lori Marshall is a 37 yo F with no multiple medical issues who presented on 12/6 to [facility] following an intentional overdose of BB/BZD. On arrival she was awake and verbal, following commands and protecting airway.  The following is obtained from chart review. EMS reports that the patient told them that she took 60 x 1 mg Xannax tablets and 30 x 50 mg atenolol tablets prior to arrival after fighting with her parents. Her only current complaint is back pain.  Her hospital course is as follows: she was admitted to the intensive care. Therapeutic interventions included: glucagon gtt for betablocker toxicity, IV fluids, safety sitter and telemetry. On 12/6 nursing reported several liquid stools and based on this and history that she was visiting at a SNF. As suspected her C diff PCR was positive and because of this she was placed on oral flagyl. Over the course of 12/6 she had boarderline hypotension requiring several fluid challenges. We ultimately placed a right IJ CVL and briefly supported her on a neosynephrine gtt. This was weaned off on 12/7, she was transferred to medical ward and continued to be monitored.   As of 12/8 she was seen by psych. They recommend in-patient psychiatric care. At time of interim discharge summary she is hemodynamically stable for d/c but to date is awaiting psychiatric bed. She will continue medical management for her C diff while she awaits d/c.    Discharge Plan by current diagnoses  Benzodiazepine overdose, risk of withdrawal. Anxiety. Migraine headache. P: Psych  consulted recommends in-pt  Resume low dose xanax  Imitrex PRN   Baseline hypotension. Beta-blocker overdose. Transient hypotension, likely hypovolemia in setting of diarrhea, doubt shock ( warm, maling urine, mentating ).  P  Goal systolic > 90   C.dif positive.  P: Started Flagyl as above, complete 10d total   Relative adrenal insufficiency - cortisol 4.9  P:  Advise repeating this as outpt & if remains symptomatic, consider adrenal stim testing     Significant Hospital tests/ studies/ interventions and procedures  Consults psychiatry    Discharge Exam: BP 111/71  Pulse 70  Temp(Src) 98.6 F (37 C) (Oral)  Resp 20  Ht 5\' 6"  (1.676 m)  Wt 84.868 kg (187 lb 1.6 oz)  BMI 30.21 kg/m2  SpO2 97%  PHYSICAL EXAMINATION:  General: Obese F in NAD  Neuro: A+O x 3  HEENT: Sclera anicteric, conjunctiva pink. MMM, OP Clear  Cardiovascular: RRR, NS1/S2, (-) MRG  Lungs: CTAB  Abdomen: S/Obese/NT/(+)BS  Musculoskeletal: (-) C/C/E  Skin: (-) Rash  Labs at discharge Lab Results  Component Value Date   CREATININE 0.52 08/07/2013   BUN 5* 08/07/2013   NA 136 08/07/2013   K 4.2 08/07/2013   CL 108 08/07/2013   CO2 20 08/07/2013   Lab Results  Component Value Date   WBC 11.1* 08/07/2013   HGB 10.9* 08/07/2013   HCT 32.9* 08/07/2013   MCV 92.7 08/07/2013   PLT 295 08/07/2013   Lab Results  Component Value Date   ALT 48* 08/07/2013   AST 26 08/07/2013   ALKPHOS 59 08/07/2013  BILITOT 0.2* 08/07/2013   Lab Results  Component Value Date   INR 0.91 12/30/2011    Current radiology studies Dg Chest Port 1 View  08/07/2013   CLINICAL DATA:  Beta-blocker overdose, line placement  EXAM: PORTABLE CHEST - 1 VIEW  COMPARISON:  Prior chest x-ray as part of acute abdominal series 12 12/2011  FINDINGS: Right IJ approach central venous catheter. The tip projects over the upper right atrium approximately 2.4 cm deep to the superior cavoatrial junction. Cardiac and mediastinal contours are  within normal limits. Inspiratory volumes are very low resulting in crowding of the pulmonary vasculature and mild bibasilar subsegmental atelectasis. No pneumothorax. No acute osseous abnormality.  IMPRESSION: 1. Right IJ central venous catheter with the tip projecting over the upper right atrium. No evidence of complicating pneumothorax. 2. Very low inspiratory volumes with associated crowding of the pulmonary vasculature and mild bibasilar atelectasis.   Electronically Signed   By: Malachy Moan M.D.   On: 08/07/2013 08:20    Disposition:  01-Home or Self Care   Scheduled Meds: . heparin  5,000 Units Subcutaneous Q8H  . metroNIDAZOLE  500 mg Oral Q8H   Continuous Infusions: . sodium chloride 150 mL/hr at 08/08/13 0849   PRN Meds:.ALPRAZolam, alum & mag hydroxide-simeth, sodium chloride   Discharged Condition: good. Awaiting admit to in-patient psych   Signed: Lyndsay Talamante,PETE 08/08/2013, 1:21 PM

## 2013-08-08 NOTE — ED Notes (Signed)
Spoke with Geoffery Spruce at [facility].   Per Geoffery Spruce, we are to hold off on transporting the patient to their facility until they can confirm ability to accommodate patient due to C-diff.  Conley Rolls ann reports that staff at [facility]   will contact us in morning

## 2013-08-09 DIAGNOSIS — A0472 Enterocolitis due to Clostridium difficile, not specified as recurrent: Secondary | ICD-10-CM | POA: Diagnosis present

## 2013-08-09 MED ORDER — PROMETHAZINE HCL 25 MG/ML IJ SOLN: 12.5000 mg | Freq: Once | INTRAMUSCULAR | Status: DC

## 2013-08-09 MED ORDER — ONDANSETRON 4 MG PO TBDP
4.0000 mg | ORAL_TABLET | Freq: Four times a day (QID) | ORAL | Status: DC | PRN
  Administered 2013-08-09 (×2): 4 mg via ORAL
  Filled 2013-08-09 (×2): qty 1

## 2013-08-09 MED ORDER — PROCHLORPERAZINE MALEATE 10 MG PO TABS
10.0000 mg | ORAL_TABLET | Freq: Four times a day (QID) | ORAL | Status: DC | PRN
  Administered 2013-08-09 – 2013-08-11 (×5): 10 mg via ORAL
  Filled 2013-08-09 (×4): qty 1

## 2013-08-09 NOTE — Progress Notes (Signed)
PULMONARY  / CRITICAL CARE MEDICINE PROGRESS NOTE  Name: Lori Marshall MRN: [id number] DOB: 21-Jun-1976    ADMISSION DATE:  08/05/2013 CONSULTATION DATE: 08/06/2013  REFERRING SERVICE:  EDP PRIMARY SERVICE: PCCM  CHIEF COMPLAINT:  Overdose  BRIEF PATIENT DESCRIPTION: 50 F with intentional benzodiazepine / beta blocker overdose admitted on 12/5.  SIGNIFICANT EVENTS / STUDIES:  12/07  Psych consult requested  LINES / TUBES: R IJ TLC 12/06 >>>12/8  CULTURES:  ANTIBIOTICS: Flagyl 12/06 >>>  INTERVAL HISTORY:  No more diarrhea, intermittent headaches   PHYSICAL EXAM  VITAL SIGNS: Temp:  [97.4 F (36.3 C)-98.1 F (36.7 C)] 97.4 F (36.3 C) (12/09 0612) Pulse Rate:  [67-88] 67 (12/09 0612) Resp:  [17-18] 18 (12/09 0612) BP: (101-124)/(68-82) 124/82 mmHg (12/09 0612) SpO2:  [96 %-99 %] 97 % (12/09 0612) Weight:  [81.375 kg (179 lb 6.4 oz)] 81.375 kg (179 lb 6.4 oz) (12/09 0828)  HEMODYNAMICS:   VENTILATOR SETTINGS:   INTAKE / OUTPUT:  Intake/Output Summary (Last 24 hours) at 08/09/13 1012 Last data filed at 08/08/13 1446  Gross per 24 hour  Intake    600 ml  Output      0 ml  Net    600 ml     PHYSICAL EXAMINATION: General:  Obese F in NAD Neuro:  A+O x 3  HEENT: Sclera anicteric, conjunctiva pink. MMM, OP Clear Cardiovascular:  RRR, NS1/S2, (-) MRG Lungs:  CTAB Abdomen:  S/Obese/NT/(+)BS Musculoskeletal:  (-) C/C/E Skin:  (-) Rash   Recent Labs Lab 08/05/13 2159 08/06/13 0340 08/07/13 0358  NA 139 143 136  K 3.7 3.6 4.2  CL 104 107 108  CO2 22 25 20   BUN 10 10 5*  CREATININE 0.52 0.52 0.52  GLUCOSE 110* 144* 145*    Recent Labs Lab 08/05/13 2159 08/06/13 0340 08/07/13 0358  HGB 12.8 11.5* 10.9*  HCT 37.5 33.7* 32.9*  WBC 8.9 9.1 11.1*  PLT 300 276 295    ASSESSMENT / PLAN:  Benzodiazepine overdose, risk of withdrawal.  Anxiety.  Migraine headache.  P: Psych consulted recommends in-pt  Resume low dose xanax  Imitrex and  compazine   C.dif positive. P: Started Flagyl as above, complete 10d total   Relative adrenal insufficiency: cortisol 4.9 Advise repeating this as outpt & if remains symptomatic, consider adrenal stim testing   No further PCCM issues. Will defer management to Dr Janee Morn. If a bed were to be offered on 12/9 call PCCM and we can complete d.c papers.    Gamal Todisco,PETE, ACNP-BC   08/09/2013, 10:12 AM

## 2013-08-09 NOTE — Progress Notes (Signed)
Clinical Social Work  Patient was discussed during Long LOS and Wellsite geologist (Dr. Jacky Kindle) reports that attending MD needs to report how long patient has been on medication for c-dff and when stools are no longer loose. CSW relayed this information to attending MD.   Patient called CSW and inquired about DC plans. CSW explained no acceptance at this time. CSW will continue to follow.  [place], [place] [phone]

## 2013-08-09 NOTE — Progress Notes (Signed)
Clinical Social Work  Print production planner and reports unable to accept patient due to c-diff. CSW called and spoke with admissions who reports accepting MD is requesting negative c-diff prior to admission. PA reports that c-diff will remain positive up to several weeks after diagnosis.   CSW informed patient and mom in change of plans. CSW will discuss case with supervisors for additional assistance.  [employer] did re-serve patient on 08/08/13 with proper paperwork completed in order to make IVC valid.  CSW will continue to follow.  [place], [place] [phone]

## 2013-08-09 NOTE — Progress Notes (Signed)
[facility] PROGRESS NOTE  Lori Marshall NFA:[id number] DOB: Oct 01, 1975 DOA: 08/05/2013 PCP: Nadean Corwin, MD  Assessment/Plan: #1 intentional benzodiazepine overdose/beta blocker overdose Clinical improvement. Close to baseline. Patient has been assessed by psychiatry and recommended inpatient psychiatric. Awaiting bed placement.  #2 migraine headaches Stable. Imitrex as needed.  #3 anxiety Started on low-dose Xanax as needed.  #4 C. difficile colitis Patient with one semi-loose stool today. Continue Flagyl to complete a ten-day course.  #5 relative adrenal insufficiency Cortisol level of 4.9 Will need repeat labs as outpatient. If remains symptomatic may need adrenal stimulation testing.  #6 transient hypotension Likely secondary to volume depletion secondary to hypovolemia in the setting of diarrhea. Clinically improved. Patient is mentating well. Hold for systolic blood pressure greater than 90.  #7 prophylaxis Heparin for DVT prophylaxis.  Code Status: Full Family Communication: Updated patient no family at bedside. Disposition Plan: To inpatient psychiatry when bed available   Consultants:  PCCM  Psychiatry: Dr. Florentina Jenny 08/07/2013  Procedures: R IJ TLC 12/06 >>>12/8 Chest x-ray 08/07/2013    Antibiotics:  Flagyl 08/06/2013  HPI/Subjective: Patient states had one semi-loose stool today. No nausea. No vomiting. Feeling better.  Objective: Filed Vitals:   08/09/13 0612  BP: 124/82  Pulse: 67  Temp: 97.4 F (36.3 C)  Resp: 18   No intake or output data in the 24 hours ending 08/09/13 1446 Filed Weights   08/07/13 0401 08/08/13 0503 08/09/13 0828  Weight: 89.3 kg (196 lb 13.9 oz) 84.868 kg (187 lb 1.6 oz) 81.375 kg (179 lb 6.4 oz)    Exam:   General:  NAD  Cardiovascular: RRR  Respiratory: CTAB  Abdomen: Soft, nontender, nondistended, positive bowel sounds  Muksculoskeletal: No clubbing cyanosis or edema   Data  Reviewed: Basic Metabolic Panel:  Recent Labs Lab 08/05/13 2159 08/06/13 0340 08/07/13 0358  NA 139 143 136  K 3.7 3.6 4.2  CL 104 107 108  CO2 22 25 20   GLUCOSE 110* 144* 145*  BUN 10 10 5*  CREATININE 0.52 0.52 0.52  CALCIUM 8.3* 7.9* 6.8*  MG  --  1.4* 1.7  PHOS  --  2.8 2.9   Liver Function Tests:  Recent Labs Lab 08/05/13 2159 08/07/13 0358  AST 43* 26  ALT 53* 48*  ALKPHOS 100 59  BILITOT <0.1* 0.2*  PROT 6.3 4.9*  ALBUMIN 3.4* 2.6*   No results found for this basename: LIPASE, AMYLASE,  in the last 168 hours No results found for this basename: AMMONIA,  in the last 168 hours CBC:  Recent Labs Lab 08/05/13 2159 08/06/13 0340 08/07/13 0358  WBC 8.9 9.1 11.1*  NEUTROABS 5.3  --   --   HGB 12.8 11.5* 10.9*  HCT 37.5 33.7* 32.9*  MCV 91.0 90.8 92.7  PLT 300 276 295   Cardiac Enzymes:  Recent Labs Lab 08/07/13 0358 08/07/13 1420  TROPONINI <0.30 <0.30   BNP (last 3 results) No results found for this basename: PROBNP,  in the last 8760 hours CBG: No results found for this basename: GLUCAP,  in the last 168 hours  Recent Results (from the past 240 hour(s))  MRSA PCR SCREENING     Status: None   Collection Time    08/06/13  2:18 AM      Result Value Range Status   MRSA by PCR NEGATIVE  NEGATIVE Final   Comment:            The GeneXpert MRSA Assay (FDA  approved for NASAL specimens     only), is one component of a     comprehensive MRSA colonization     surveillance program. It is not     intended to diagnose MRSA     infection nor to guide or     monitor treatment for     MRSA infections.  CLOSTRIDIUM DIFFICILE BY PCR     Status: Abnormal   Collection Time    08/06/13 10:05 AM      Result Value Range Status   C difficile by pcr POSITIVE (*) NEGATIVE Final   Comment: CRITICAL RESULT CALLED TO, READ BACK BY AND VERIFIED WITH:     C.BONGEL,MLT 1310 08/06/13 M.CAMPBELL     CRITICAL RESULT CALLED TO, READ BACK BY AND VERIFIED WITH:      A.ARNOLD RN AT 1313 ON [id number] BY C.BONGEL     Studies: No results found.  Scheduled Meds: . heparin  5,000 Units Subcutaneous Q8H  . metroNIDAZOLE  500 mg Oral Q8H   Continuous Infusions:   Principal Problem:   Suicide attempt by beta blocker overdose Active Problems:   DEPRESSION   Benzodiazepine overdose   Elevated liver enzymes   Overdose   Clostridium difficile colitis    Time spent: 30 minutes    THOMPSON,DANIEL M.D. [facility] Pager [phone]. If 7PM-7AM, please contact night-coverage at [email or web address], password [facility] 08/09/2013, 2:46 PM  LOS: 4 days

## 2013-08-10 LAB — BASIC METABOLIC PANEL
BUN: 8 mg/dL (ref 6–23)
CO2: 25 mEq/L (ref 19–32)
Calcium: 8.6 mg/dL (ref 8.4–10.5)
Chloride: 100 mEq/L (ref 96–112)
Creatinine, Ser: 0.66 mg/dL (ref 0.50–1.10)
GFR calc Af Amer: >90 mL/min (ref >90)
GFR calc non Af Amer: >90 mL/min (ref >90)
Glucose, Bld: 116 mg/dL — ABNORMAL HIGH (ref 70–99)
Potassium: 3.8 mEq/L (ref 3.5–5.1)
Sodium: 135 mEq/L (ref 135–145)

## 2013-08-10 MED ORDER — VALPROATE SODIUM 500 MG/5ML IV SOLN
500.0000 mg | INTRAVENOUS | Status: DC
  Filled 2013-08-10: qty 5

## 2013-08-10 MED ORDER — PANTOPRAZOLE SODIUM 40 MG PO TBEC
40.0000 mg | DELAYED_RELEASE_TABLET | Freq: Every day | ORAL | Status: DC
  Administered 2013-08-11: 40 mg via ORAL
  Filled 2013-08-10 (×2): qty 1

## 2013-08-10 MED ORDER — VALPROIC ACID 250 MG PO CAPS
500.0000 mg | ORAL_CAPSULE | Freq: Once | ORAL | Status: AC
  Administered 2013-08-10: 500 mg via ORAL
  Filled 2013-08-10: qty 2

## 2013-08-10 MED ORDER — METHOCARBAMOL 500 MG PO TABS
500.0000 mg | ORAL_TABLET | Freq: Four times a day (QID) | ORAL | Status: DC
  Administered 2013-08-10 – 2013-08-11 (×5): 500 mg via ORAL
  Filled 2013-08-10 (×8): qty 1

## 2013-08-10 MED ORDER — NAPROXEN 500 MG PO TABS
500.0000 mg | ORAL_TABLET | Freq: Two times a day (BID) | ORAL | Status: DC
  Administered 2013-08-10 – 2013-08-11 (×3): 500 mg via ORAL
  Filled 2013-08-10 (×4): qty 1

## 2013-08-10 NOTE — Progress Notes (Signed)
[facility] PROGRESS NOTE  Lori Marshall ZOX:[id number] DOB: [birth year] DOA: 08/05/2013 PCP: Nadean Corwin, MD  Assessment/Plan: #1 intentional benzodiazepine overdose/beta blocker overdose Clinical improvement. Close to baseline. Patient has been assessed by psychiatry and recommended inpatient psychiatric. Awaiting bed placement. Psychiatry to reassess today.  #2 migraine headaches Patient currently complaining of migraine headache. Patient states just prior to admission due to worsening migraine headaches did not leave the house for approximately 33 days. Patient request a neurology consultation. Imitrex as needed. Will consult with neurology for further evaluation and recommendations.  #3 anxiety Started on low-dose Xanax as needed.  #4 C. difficile colitis Patient with one formed stool today. Continue Flagyl to complete a ten-day course.  #5 relative adrenal insufficiency Cortisol level of 4.9 Will need repeat labs as outpatient. If remains symptomatic may need adrenal stimulation testing.  #6 transient hypotension Likely secondary to volume depletion secondary to hypovolemia in the setting of diarrhea. Clinically improved. Patient is mentating well. Goal systolic blood pressure greater than 90.  #7 prophylaxis Heparin for DVT prophylaxis.  Code Status: Full Family Communication: Updated patient no family at bedside. Disposition Plan: To inpatient psychiatry when bed available   Consultants:  PCCM  Psychiatry: Dr. Florentina Jenny 08/07/2013  Procedures: R IJ TLC 12/06 >>>12/8 Chest x-ray 08/07/2013    Antibiotics:  Flagyl 08/06/2013  HPI/Subjective: Patient states had one formed stool today. No nausea. No vomiting. No abdominal pain. Patient complaining of migraine headaches. Patient stated just prior to admission had severe migraine headaches and a such did not leave the house for 33 days. Patient requesting a neurology  consultation.  Objective: Filed Vitals:   08/10/13 1423  BP: 146/90  Pulse: 78  Temp: 98.5 F (36.9 C)  Resp: 10   No intake or output data in the 24 hours ending 08/10/13 1502 Filed Weights   08/08/13 0503 08/09/13 0828 08/10/13 0500  Weight: 84.868 kg (187 lb 1.6 oz) 81.375 kg (179 lb 6.4 oz) 80.9 kg (178 lb 5.6 oz)    Exam:   General:  NAD  Cardiovascular: RRR  Respiratory: CTAB  Abdomen: Soft, nontender, nondistended, positive bowel sounds  Muksculoskeletal: No clubbing cyanosis or edema   Data Reviewed: Basic Metabolic Panel:  Recent Labs Lab 08/05/13 2159 08/06/13 0340 08/07/13 0358 08/10/13 0520  NA 139 143 136 135  K 3.7 3.6 4.2 3.8  CL 104 107 108 100  CO2 22 25 20 25   GLUCOSE 110* 144* 145* 116*  BUN 10 10 5* 8  CREATININE 0.52 0.52 0.52 0.66  CALCIUM 8.3* 7.9* 6.8* 8.6  MG  --  1.4* 1.7  --   PHOS  --  2.8 2.9  --    Liver Function Tests:  Recent Labs Lab 08/05/13 2159 08/07/13 0358  AST 43* 26  ALT 53* 48*  ALKPHOS 100 59  BILITOT <0.1* 0.2*  PROT 6.3 4.9*  ALBUMIN 3.4* 2.6*   No results found for this basename: LIPASE, AMYLASE,  in the last 168 hours No results found for this basename: AMMONIA,  in the last 168 hours CBC:  Recent Labs Lab 08/05/13 2159 08/06/13 0340 08/07/13 0358  WBC 8.9 9.1 11.1*  NEUTROABS 5.3  --   --   HGB 12.8 11.5* 10.9*  HCT 37.5 33.7* 32.9*  MCV 91.0 90.8 92.7  PLT 300 276 295   Cardiac Enzymes:  Recent Labs Lab 08/07/13 0358 08/07/13 1420  TROPONINI <0.30 <0.30   BNP (last 3 results) No results found for this basename: PROBNP,  in the last 8760 hours CBG: No results found for this basename: GLUCAP,  in the last 168 hours  Recent Results (from the past 240 hour(s))  MRSA PCR SCREENING     Status: None   Collection Time    08/06/13  2:18 AM      Result Value Range Status   MRSA by PCR NEGATIVE  NEGATIVE Final   Comment:            The GeneXpert MRSA Assay (FDA     approved for  NASAL specimens     only), is one component of a     comprehensive MRSA colonization     surveillance program. It is not     intended to diagnose MRSA     infection nor to guide or     monitor treatment for     MRSA infections.  CLOSTRIDIUM DIFFICILE BY PCR     Status: Abnormal   Collection Time    08/06/13 10:05 AM      Result Value Range Status   C difficile by pcr POSITIVE (*) NEGATIVE Final   Comment: CRITICAL RESULT CALLED TO, READ BACK BY AND VERIFIED WITH:     C.BONGEL,MLT 1310 08/06/13 M.CAMPBELL     CRITICAL RESULT CALLED TO, READ BACK BY AND VERIFIED WITH:     A.ARNOLD RN AT 1313 ON [id number] BY C.BONGEL     Studies: No results found.  Scheduled Meds: . heparin  5,000 Units Subcutaneous Q8H  . metroNIDAZOLE  500 mg Oral Q8H   Continuous Infusions:   Principal Problem:   Suicide attempt by beta blocker overdose Active Problems:   DEPRESSION   Benzodiazepine overdose   Elevated liver enzymes   Overdose   Clostridium difficile colitis    Time spent: 30 minutes    THOMPSON,DANIEL M.D. [facility] Pager [phone]. If 7PM-7AM, please contact night-coverage at [email or web address], password [facility] 08/10/2013, 3:02 PM  LOS: 5 days

## 2013-08-10 NOTE — Progress Notes (Signed)
Clinical Social Work  CSW called [facility] to follow up on referral. CSW spoke with admissions who reports that they will review referral and call CSW back with response. CSW will continue to follow.  [place], [place] [phone]

## 2013-08-10 NOTE — Progress Notes (Signed)
Clinical Social Work  CSW met with patient and mom at bedside. Patient reports she is getting anxious about staying in the hospital and wants to DC home. Patient inquiring about outpatient options. Patient also wants to discuss case with psych MD again because she feels she can contract for safety. Patient denies any SI or HI at this time and reports depression is increasing. CSW explained that patient remains under IVC and that psych MD makes recommendations regarding disposition planning. CSW did explain that psych MD has recommended inpatient at this time and CSW continues to search for placement. CSW called and confirmed with [facility] that patient was on consult list for today.   CSW spoke with [place] who reports possible DC today. CSW sent referral for [facility] to review.  CSW will continue to follow.  [place], [place] [phone]

## 2013-08-10 NOTE — Consult Note (Signed)
NEURO HOSPITALIST CONSULT NOTE    Reason for Consult: intractable HA  HPI:                                                                                                                                          Lori Marshall is an 37 y.o. female female patient admitted with overdose of beta blocker and alcohol intoxication. While in hospital she has continued to have HA. She states she has had a chronic daily HA for 6 months -even with being on Atenolol 100 mg Q HS.  HA start in the middle of her forehead then spread laterally to the occipital region.  They include photophobia, N/V and she states she cannot do her job as they are so debilitating.  She has been on Fioricet in the past but could not take how it made her feel. Her PCP has made her a appointment with Dr. Glori Bickers neurologist but she cannot recall when it is.  She has been taking Compazine 10 mg Q 6 hours and Imitrex 25 mg Q 2 hours PRN while in hospital which has helped.  Currently she feels she is a 6/10 , resting comfortably in bed and able to have a in depth conversation.   Looking back in chart, she was seen in ED on 04/28/13 for HA at that time CT head was obtained showing no abnormality. At that time she was given IV metoclopramide, and IV diphenhydramine which seemed to resolve the HA. She was seen again on 06/23/13 for similar symptoms but due to no meningeal signs she was discharged home.   Patient is concerned about going to a rehab facility without being on medication for her HA  Past Medical History  Diagnosis Date  . Migraine, unspecified, without mention of intractable migraine without mention of status migrainosus   . Vomiting alone   . Irritable bowel syndrome   . Esophageal reflux   . Diarrhea   . Panic disorder without agoraphobia   . Hypothyroid   . Depressive disorder, not elsewhere classified   . Depression   . Anxiety     Past Surgical History  Procedure Laterality Date  . Wisdom  tooth extraction    . Appendectomy    . Cholecystectomy  08/19/2011    Procedure: LAPAROSCOPIC CHOLECYSTECTOMY WITH INTRAOPERATIVE CHOLANGIOGRAM;  Surgeon: Velora Heckler, MD;  Location: [facility];  Service: General;  Laterality: N/A;  . Colon surgery      Family History  Problem Relation Age of Onset  . Diabetes Father   . Melanoma Father   . Colon polyps Father   . Colon cancer Neg Hx      Social History:  reports that she has been smoking Cigarettes.  She has a 2.5 pack-year smoking history. She  has never used smokeless tobacco. She reports that she does not drink alcohol or use illicit drugs.  Allergies  Allergen Reactions  . Dust Mite Extract Swelling  . Medrol [Methylprednisolone]   . Novocain [Procaine]     unknown  . Procaine Hcl Palpitations    MEDICATIONS:                                                                                                                     Prior to Admission:  Prescriptions prior to admission  Medication Sig Dispense Refill  . ALPRAZolam (XANAX) 1 MG tablet Take 1 mg by mouth 3 (three) times daily.      [name] atenolol (TENORMIN) 100 MG tablet Take 100 mg by mouth at bedtime.      . cetirizine (ZYRTEC) 10 MG tablet Take 10 mg by mouth daily.      . citalopram (CELEXA) 40 MG tablet Take 40 mg by mouth at bedtime.      . meloxicam (MOBIC) 15 MG tablet Take 15 mg by mouth daily.      . ondansetron (ZOFRAN ODT) 4 MG disintegrating tablet Take 1 tablet (4 mg total) by mouth every 8 (eight) hours as needed for nausea.  10 tablet  0  . promethazine (PHENERGAN) 25 MG tablet Take 25 mg by mouth every 6 (six) hours as needed. nausea       Scheduled: . heparin  5,000 Units Subcutaneous Q8H  . metroNIDAZOLE  500 mg Oral Q8H     ROS:                                                                                                                                       History obtained from the patient  General ROS: negative for - chills, fatigue, fever,  night sweats, weight gain or weight loss Psychological ROS: negative for - behavioral disorder, hallucinations, memory difficulties, mood swings or suicidal ideation Ophthalmic ROS: negative for - blurry vision, double vision, eye pain or loss of vision ENT ROS: negative for - epistaxis, nasal discharge, oral lesions, sore throat, tinnitus or vertigo Allergy and Immunology ROS: negative for - hives or itchy/watery eyes Hematological and Lymphatic ROS: negative for - bleeding problems, bruising or swollen lymph nodes Endocrine ROS: negative for - galactorrhea, hair pattern changes, polydipsia/polyuria or temperature intolerance Respiratory ROS: negative for - cough, hemoptysis, shortness of breath or wheezing Cardiovascular ROS: negative  for - chest pain, dyspnea on exertion, edema or irregular heartbeat Gastrointestinal ROS: negative for - abdominal pain, diarrhea, hematemesis, nausea/vomiting or stool incontinence Genito-Urinary ROS: negative for - dysuria, hematuria, incontinence or urinary frequency/urgency Musculoskeletal ROS: negative for - joint swelling or muscular weakness Neurological ROS: as noted in HPI Dermatological ROS: negative for rash and skin lesion changes   Blood pressure 146/90, pulse 78, temperature 98.5 F (36.9 C), temperature source Oral, resp. rate 10, height 5\' 6"  (1.676 m), weight 80.9 kg (178 lb 5.6 oz), SpO2 98.00%.   Neurologic Examination:                                                                                                      Mental Status: Alert, oriented, thought content appropriate.  Speech fluent without evidence of aphasia.  Able to follow 3 step commands without difficulty. Cranial Nerves: II: Discs flat bilaterally; Visual fields grossly normal, pupils equal, round, reactive to light and accommodation III,IV, VI: ptosis not present, extra-ocular motions intact bilaterally V,VII: smile symmetric, facial light touch sensation normal  bilaterally VIII: hearing normal bilaterally IX,X: gag reflex present XI: bilateral shoulder shrug XII: midline tongue extension without atrophy or fasciculations  Motor: Right : Upper extremity   5/5    Left:     Upper extremity   5/5  Lower extremity   5/5     Lower extremity   5/5 Tone and bulk:normal tone throughout; no atrophy noted Sensory: Pinprick and light touch intact throughout, bilaterally Deep Tendon Reflexes:  Right: Upper Extremity   Left: Upper extremity   biceps (C-5 to C-6) 2/4   biceps (C-5 to C-6) 2/4 tricep (C7) 2/4    triceps (C7) 2/4 Brachioradialis (C6) 2/4  Brachioradialis (C6) 2/4  Lower Extremity Lower Extremity  quadriceps (L-2 to L-4) 2/4   quadriceps (L-2 to L-4) 2/4 Achilles (S1) 2/4   Achilles (S1) 2/4  Plantars: Right: downgoing   Left: downgoing Cerebellar: normal finger-to-nose,  normal heel-to-shin test Gait: normal CV: pulses palpable throughout    No results found for this basename: cbc, bmp, coags, chol, tri, ldl, hga1c    Results for orders placed during the hospital encounter of 08/05/13 (from the past 48 hour(s))  BASIC METABOLIC PANEL     Status: Abnormal   Collection Time    08/10/13  5:20 AM      Result Value Range   Sodium 135  135 - 145 mEq/L   Potassium 3.8  3.5 - 5.1 mEq/L   Chloride 100  96 - 112 mEq/L   CO2 25  19 - 32 mEq/L   Glucose, Bld 116 (*) 70 - 99 mg/dL   BUN 8  6 - 23 mg/dL   Creatinine, Ser 1.61  0.50 - 1.10 mg/dL   Calcium 8.6  8.4 - 09.6 mg/dL   GFR calc non Af Amer >90  >90 mL/min   GFR calc Af Amer >90  >90 mL/min   Comment: (NOTE)     The eGFR has been calculated using the CKD EPI equation.     This  calculation has not been validated in all clinical situations.     eGFR's persistently <90 mL/min signify possible Chronic Kidney     Disease.    No results found.  Assessment and plan per attending neurologist  Felicie Morn PA-C [facility] [phone]  08/10/2013, 4:12  PM   Assessment/Plan: 37 YO female with chronic daily HA for 6 months. Currently having some relief with Compazine but still rates HA at 6/10. Currently patient resting comfortably in bed watching TV and exam is non-focal. Probable mixed headache, including features of tension as well as possible vascular headache.  Recommend: 1) 1 time dose of Depakote 500 mg now IV 2) Protonix 40 mg daily 3) Robaxin 500 mg Q 6 hours PO 4) Naprosyn 500 mg Q 12 hours PO  I personally participate in this patient's evaluation and management, including formulating the above clinical impression and management recommendations.  Venetia Maxon M.D. [facility] [phone]

## 2013-08-10 NOTE — Consult Note (Signed)
Psychiatry Follow Up Note    Assessment: AXIS I:  Polysubstance abuse, suicide attempt,  AXIS II:  Deferred AXIS III:   Past Medical History  Diagnosis Date  . Migraine, unspecified, without mention of intractable migraine without mention of status migrainosus   . Vomiting alone   . Irritable bowel syndrome   . Esophageal reflux   . Diarrhea   . Panic disorder without agoraphobia   . Hypothyroid   . Depressive disorder, not elsewhere classified   . Depression   . Anxiety    AXIS IV:  occupational problems and other psychosocial or environmental problems AXIS V:  41-50 serious symptoms  Plan:  Recommend psychiatric Inpatient admission when medically cleared. 1. IVC for safety. 2. Continue 1:1 for safety. 3. Will admit to [facility] for stabilization and medication management upon discharge and bed availability.  Subjective:   Lori Marshall is a 37 y.o. female patient admitted with overdose of beta blocker and alcohol intoxication.  Patient seemed [facility].  Patient recently she's been really depressed anxious and having headaches.  She took 30 tablets of beta blocker because she does not want to live anymore.  She continues to have depressive thoughts, headache, feeling depressed with lack of motivation .  She admitted feeling of hopelessness and helplessness.  She remembers seeing psychiatrist but does not followup here she's been getting benzodiazepines from her primary care physician.  She denies any hallucination , paranoia but continued to endorse feeling of hopelessness, worthlessness and depressive thoughts.  She cannot contract for safety.    Past Psychiatric History: Past Medical History  Diagnosis Date  . Migraine, unspecified, without mention of intractable migraine without mention of status migrainosus   . Vomiting alone   . Irritable bowel syndrome   . Esophageal reflux   . Diarrhea   . Panic disorder without agoraphobia   . Hypothyroid   . Depressive disorder,  not elsewhere classified   . Depression   . Anxiety     reports that she has been smoking Cigarettes.  She has a 2.5 pack-year smoking history. She has never used smokeless tobacco. She reports that she does not drink alcohol or use illicit drugs. Family History  Problem Relation Age of Onset  . Diabetes Father   . Melanoma Father   . Colon polyps Father   . Colon cancer Neg Hx      Living Arrangements: Parent   Abuse/Neglect [facility]) Physical Abuse: Denies Verbal Abuse: Denies Sexual Abuse: Denies Allergies:   Allergies  Allergen Reactions  . Dust Mite Extract Swelling  . Medrol [Methylprednisolone]   . Novocain [Procaine]     unknown  . Procaine Hcl Palpitations    Results for orders placed during the hospital encounter of 08/05/13 (from the past 72 hour(s))  CBC WITH DIFFERENTIAL     Status: None   Collection Time    08/05/13  9:59 PM      Result Value Range   WBC 8.9  4.0 - 10.5 K/uL   RBC 4.12  3.87 - 5.11 MIL/uL   Hemoglobin 12.8  12.0 - 15.0 g/dL   HCT 16.1  09.6 - 04.5 %   MCV 91.0  78.0 - 100.0 fL   MCH 31.1  26.0 - 34.0 pg   MCHC 34.1  30.0 - 36.0 g/dL   RDW 40.9  81.1 - 91.4 %   Platelets 300  150 - 400 K/uL   Neutrophils Relative % 60  43 - 77 %  Neutro Abs 5.3  1.7 - 7.7 K/uL   Lymphocytes Relative 31  12 - 46 %   Lymphs Abs 2.7  0.7 - 4.0 K/uL   Monocytes Relative 8  3 - 12 %   Monocytes Absolute 0.7  0.1 - 1.0 K/uL   Eosinophils Relative 2  0 - 5 %   Eosinophils Absolute 0.2  0.0 - 0.7 K/uL   Basophils Relative 0  0 - 1 %   Basophils Absolute 0.0  0.0 - 0.1 K/uL  COMPREHENSIVE METABOLIC PANEL     Status: Abnormal   Collection Time    08/05/13  9:59 PM      Result Value Range   Sodium 139  135 - 145 mEq/L   Potassium 3.7  3.5 - 5.1 mEq/L   Chloride 104  96 - 112 mEq/L   CO2 22  19 - 32 mEq/L   Glucose, Bld 110 (*) 70 - 99 mg/dL   BUN 10  6 - 23 mg/dL   Creatinine, Ser 1.61  0.50 - 1.10 mg/dL   Calcium 8.3 (*) 8.4 - 10.5 mg/dL   Total  Protein 6.3  6.0 - 8.3 g/dL   Albumin 3.4 (*) 3.5 - 5.2 g/dL   AST 43 (*) 0 - 37 U/L   ALT 53 (*) 0 - 35 U/L   Alkaline Phosphatase 100  39 - 117 U/L   Total Bilirubin <0.1 (*) 0.3 - 1.2 mg/dL   GFR calc non Af Amer >90  >90 mL/min   GFR calc Af Amer >90  >90 mL/min   Comment: (NOTE)     The eGFR has been calculated using the CKD EPI equation.     This calculation has not been validated in all clinical situations.     eGFR's persistently <90 mL/min signify possible Chronic Kidney     Disease.  ACETAMINOPHEN LEVEL     Status: None   Collection Time    08/05/13  9:59 PM      Result Value Range   Acetaminophen (Tylenol), Serum <15.0  10 - 30 ug/mL   Comment:            THERAPEUTIC CONCENTRATIONS VARY     SIGNIFICANTLY. A RANGE OF 10-30     ug/mL MAY BE AN EFFECTIVE     CONCENTRATION FOR MANY PATIENTS.     HOWEVER, SOME ARE BEST TREATED     AT CONCENTRATIONS OUTSIDE THIS     RANGE.     ACETAMINOPHEN CONCENTRATIONS     >150 ug/mL AT 4 HOURS AFTER     INGESTION AND >50 ug/mL AT 12     HOURS AFTER INGESTION ARE     OFTEN ASSOCIATED WITH TOXIC     REACTIONS.  ETHANOL     Status: Abnormal   Collection Time    08/05/13  9:59 PM      Result Value Range   Alcohol, Ethyl (B) 115 (*) 0 - 11 mg/dL   Comment:            LOWEST DETECTABLE LIMIT FOR     SERUM ALCOHOL IS 11 mg/dL     FOR MEDICAL PURPOSES ONLY  SALICYLATE LEVEL     Status: Abnormal   Collection Time    08/05/13  9:59 PM      Result Value Range   Salicylate Lvl <2.0 (*) 2.8 - 20.0 mg/dL  URINALYSIS, ROUTINE W REFLEX MICROSCOPIC     Status: None   Collection Time  08/05/13 11:43 PM      Result Value Range   Color, Urine YELLOW  YELLOW   APPearance CLEAR  CLEAR   Specific Gravity, Urine 1.006  1.005 - 1.030   pH 5.5  5.0 - 8.0   Glucose, UA NEGATIVE  NEGATIVE mg/dL   Hgb urine dipstick NEGATIVE  NEGATIVE   Bilirubin Urine NEGATIVE  NEGATIVE   Ketones, ur NEGATIVE  NEGATIVE mg/dL   Protein, ur NEGATIVE  NEGATIVE  mg/dL   Urobilinogen, UA 0.2  0.0 - 1.0 mg/dL   Nitrite NEGATIVE  NEGATIVE   Leukocytes, UA NEGATIVE  NEGATIVE   Comment: MICROSCOPIC NOT DONE ON URINES WITH NEGATIVE PROTEIN, BLOOD, LEUKOCYTES, NITRITE, OR GLUCOSE <1000 mg/dL.  PREGNANCY, URINE     Status: None   Collection Time    08/05/13 11:43 PM      Result Value Range   Preg Test, Ur NEGATIVE  NEGATIVE   Comment:            THE SENSITIVITY OF THIS     METHODOLOGY IS >20 mIU/mL.  URINE RAPID DRUG SCREEN (HOSP PERFORMED)     Status: Abnormal   Collection Time    08/05/13 11:43 PM      Result Value Range   Opiates NONE DETECTED  NONE DETECTED   Cocaine NONE DETECTED  NONE DETECTED   Benzodiazepines POSITIVE (*) NONE DETECTED   Amphetamines NONE DETECTED  NONE DETECTED   Tetrahydrocannabinol NONE DETECTED  NONE DETECTED   Barbiturates NONE DETECTED  NONE DETECTED   Comment:            DRUG SCREEN FOR MEDICAL PURPOSES     ONLY.  IF CONFIRMATION IS NEEDED     FOR ANY PURPOSE, NOTIFY LAB     WITHIN 5 DAYS.                LOWEST DETECTABLE LIMITS     FOR URINE DRUG SCREEN     Drug Class       Cutoff (ng/mL)     Amphetamine      1000     Barbiturate      200     Benzodiazepine   200     Tricyclics       300     Opiates          300     Cocaine          300     THC              50  MRSA PCR SCREENING     Status: None   Collection Time    08/06/13  2:18 AM      Result Value Range   MRSA by PCR NEGATIVE  NEGATIVE   Comment:            The GeneXpert MRSA Assay (FDA     approved for NASAL specimens     only), is one component of a     comprehensive MRSA colonization     surveillance program. It is not     intended to diagnose MRSA     infection nor to guide or     monitor treatment for     MRSA infections.  BLOOD GAS, ARTERIAL     Status: Abnormal   Collection Time    08/06/13  2:40 AM      Result Value Range   O2 Content 3.0     Delivery systems NASAL CANNULA  pH, Arterial 7.320 (*) 7.350 - 7.450   pCO2  arterial 47.9 (*) 35.0 - 45.0 mmHg   pO2, Arterial 137.0 (*) 80.0 - 100.0 mmHg   Bicarbonate 24.0  20.0 - 24.0 mEq/L   TCO2 22.2  0 - 100 mmol/L   Acid-base deficit 1.8  0.0 - 2.0 mmol/L   O2 Saturation 98.0     Patient temperature 37.0     Collection site BRACHIAL ARTERY     Drawn by [id number]     Sample type ARTERIAL DRAW    CBC     Status: Abnormal   Collection Time    08/06/13  3:40 AM      Result Value Range   WBC 9.1  4.0 - 10.5 K/uL   RBC 3.71 (*) 3.87 - 5.11 MIL/uL   Hemoglobin 11.5 (*) 12.0 - 15.0 g/dL   HCT 04.5 (*) 40.9 - 81.1 %   MCV 90.8  78.0 - 100.0 fL   MCH 31.0  26.0 - 34.0 pg   MCHC 34.1  30.0 - 36.0 g/dL   RDW 91.4  78.2 - 95.6 %   Platelets 276  150 - 400 K/uL  BASIC METABOLIC PANEL     Status: Abnormal   Collection Time    08/06/13  3:40 AM      Result Value Range   Sodium 143  135 - 145 mEq/L   Potassium 3.6  3.5 - 5.1 mEq/L   Chloride 107  96 - 112 mEq/L   CO2 25  19 - 32 mEq/L   Glucose, Bld 144 (*) 70 - 99 mg/dL   BUN 10  6 - 23 mg/dL   Creatinine, Ser 2.13  0.50 - 1.10 mg/dL   Calcium 7.9 (*) 8.4 - 10.5 mg/dL   GFR calc non Af Amer >90  >90 mL/min   GFR calc Af Amer >90  >90 mL/min   Comment: (NOTE)     The eGFR has been calculated using the CKD EPI equation.     This calculation has not been validated in all clinical situations.     eGFR's persistently <90 mL/min signify possible Chronic Kidney     Disease.  MAGNESIUM     Status: Abnormal   Collection Time    08/06/13  3:40 AM      Result Value Range   Magnesium 1.4 (*) 1.5 - 2.5 mg/dL  PHOSPHORUS     Status: None   Collection Time    08/06/13  3:40 AM      Result Value Range   Phosphorus 2.8  2.3 - 4.6 mg/dL  CLOSTRIDIUM DIFFICILE BY PCR     Status: Abnormal   Collection Time    08/06/13 10:05 AM      Result Value Range   C difficile by pcr POSITIVE (*) NEGATIVE   Comment: CRITICAL RESULT CALLED TO, READ BACK BY AND VERIFIED WITH:     C.BONGEL,MLT 1310 08/06/13 M.CAMPBELL      CRITICAL RESULT CALLED TO, READ BACK BY AND VERIFIED WITH:     A.ARNOLD RN AT 1313 ON [id number] BY C.BONGEL  CBC     Status: Abnormal   Collection Time    08/07/13  3:58 AM      Result Value Range   WBC 11.1 (*) 4.0 - 10.5 K/uL   RBC 3.55 (*) 3.87 - 5.11 MIL/uL   Hemoglobin 10.9 (*) 12.0 - 15.0 g/dL   HCT 08.6 (*) 57.8 - 46.9 %   MCV 92.7  78.0 - 100.0 fL   MCH 30.7  26.0 - 34.0 pg   MCHC 33.1  30.0 - 36.0 g/dL   RDW 02.7  25.3 - 66.4 %   Platelets 295  150 - 400 K/uL  COMPREHENSIVE METABOLIC PANEL     Status: Abnormal   Collection Time    08/07/13  3:58 AM      Result Value Range   Sodium 136  135 - 145 mEq/L   Comment: DELTA CHECK NOTED   Potassium 4.2  3.5 - 5.1 mEq/L   Chloride 108  96 - 112 mEq/L   CO2 20  19 - 32 mEq/L   Glucose, Bld 145 (*) 70 - 99 mg/dL   BUN 5 (*) 6 - 23 mg/dL   Creatinine, Ser 4.03  0.50 - 1.10 mg/dL   Calcium 6.8 (*) 8.4 - 10.5 mg/dL   Total Protein 4.9 (*) 6.0 - 8.3 g/dL   Albumin 2.6 (*) 3.5 - 5.2 g/dL   AST 26  0 - 37 U/L   ALT 48 (*) 0 - 35 U/L   Alkaline Phosphatase 59  39 - 117 U/L   Total Bilirubin 0.2 (*) 0.3 - 1.2 mg/dL   GFR calc non Af Amer >90  >90 mL/min   GFR calc Af Amer >90  >90 mL/min   Comment: (NOTE)     The eGFR has been calculated using the CKD EPI equation.     This calculation has not been validated in all clinical situations.     eGFR's persistently <90 mL/min signify possible Chronic Kidney     Disease.  CORTISOL     Status: None   Collection Time    08/07/13  3:58 AM      Result Value Range   Cortisol, Plasma 4.9     Comment: (NOTE)     AM:  4.3 - 22.4 ug/dL     PM:  3.1 - 47.4 ug/dL     Performed at [facility]  TROPONIN I     Status: None   Collection Time    08/07/13  3:58 AM      Result Value Range   Troponin I <0.30  <0.30 ng/mL   Comment:            Due to the release kinetics of cTnI,     a negative result within the first hours     of the onset of symptoms does not rule out     myocardial  infarction with certainty.     If myocardial infarction is still suspected,     repeat the test at appropriate intervals.  MAGNESIUM     Status: None   Collection Time    08/07/13  3:58 AM      Result Value Range   Magnesium 1.7  1.5 - 2.5 mg/dL  PHOSPHORUS     Status: None   Collection Time    08/07/13  3:58 AM      Result Value Range   Phosphorus 2.9  2.3 - 4.6 mg/dL  TROPONIN I     Status: None   Collection Time    08/07/13  2:20 PM      Result Value Range   Troponin I <0.30  <0.30 ng/mL   Comment:            Due to the release kinetics of cTnI,     a negative result within the first hours     of the onset  of symptoms does not rule out     myocardial infarction with certainty.     If myocardial infarction is still suspected,     repeat the test at appropriate intervals.   Labs are reviewed and are pertinent for UDS + for alcohol and benzodiazepines.  Current Facility-Administered Medications  Medication Dose Route Frequency Provider Last Rate Last Dose  . 0.9 %  sodium chloride infusion   Intravenous Continuous Lonia Farber, MD 150 mL/hr at 08/07/13 2120    . alum & mag hydroxide-simeth (MAALOX/MYLANTA) 200-200-20 MG/5ML suspension 15 mL  15 mL Oral Q6H PRN Deanna Artis, MD   15 mL at 08/07/13 1853  . heparin injection 5,000 Units  5,000 Units Subcutaneous Q8H Jenelle Mages, MD   5,000 Units at 08/07/13 2119  . LORazepam (ATIVAN) 2 MG/ML injection           . metroNIDAZOLE (FLAGYL) tablet 500 mg  500 mg Oral Q8H Simonne Martinet, NP   500 mg at 08/07/13 2119  . sodium chloride 0.9 % injection 10-40 mL  10-40 mL Intracatheter PRN Storm Frisk, MD        Psychiatric Specialty Exam:     Blood pressure 146/90, pulse 78, temperature 98.5 F (36.9 C), temperature source Oral, resp. rate 10, height 5\' 6"  (1.676 m), weight 178 lb 5.6 oz (80.9 kg), SpO2 98.00%.Body mass index is 28.8 kg/(m^2).  General Appearance: drowsy and lethargic  Eye Contact::  Minimal   Speech:  Clear and Coherent  Volume:  Normal  Mood:  Anxious and Depressed  Affect:  Congruent  Thought Process:  Goal Directed  Orientation:  Full (Time, Place, and Person)  Thought Content:  WDL  Suicidal Thoughts:  Denies but cannot contract for safety   Homicidal Thoughts:  denies  Memory:  poor  Judgement:  Impaired  Insight:  Lacking  Psychomotor Activity:  Decreased  Concentration:  Fair  Recall:  Fair  Akathisia:  No  Handed:  Right  AIMS (if indicated):     Assets:  Communication Skills Desire for Improvement Housing Physical Health  Sleep:      Treatment Plan Summary: First inpatient psychiatric treatment for stabilization.  Continue sitter for safety.  Transfer to behavioral center when bed is available.  Please call [phone] if you have a further question.  Kathryne Sharper, MD

## 2013-08-11 ENCOUNTER — Encounter (HOSPITAL_COMMUNITY): Payer: Self-pay | Admitting: *Deleted

## 2013-08-11 ENCOUNTER — Inpatient Hospital Stay (HOSPITAL_COMMUNITY)
Admission: EM | Admit: 2013-08-11 | Discharge: 2013-08-15 | DRG: 897 | Disposition: A | Discharge status: Home / Self Care | Payer: Federal, State, Local not specified - Other | Source: Intra-hospital | Attending: Psychiatry | Admitting: Psychiatry

## 2013-08-11 DIAGNOSIS — F411 Generalized anxiety disorder: Secondary | ICD-10-CM | POA: Diagnosis present

## 2013-08-11 DIAGNOSIS — A0472 Enterocolitis due to Clostridium difficile, not specified as recurrent: Secondary | ICD-10-CM

## 2013-08-11 DIAGNOSIS — T50901S Poisoning by unspecified drugs, medicaments and biological substances, accidental (unintentional), sequela: Secondary | ICD-10-CM

## 2013-08-11 DIAGNOSIS — E039 Hypothyroidism, unspecified: Secondary | ICD-10-CM | POA: Diagnosis present

## 2013-08-11 DIAGNOSIS — F32A Depression, unspecified: Secondary | ICD-10-CM | POA: Diagnosis present

## 2013-08-11 DIAGNOSIS — Z83719 Family history of colon polyps, unspecified: Secondary | ICD-10-CM

## 2013-08-11 DIAGNOSIS — K589 Irritable bowel syndrome without diarrhea: Secondary | ICD-10-CM | POA: Diagnosis present

## 2013-08-11 DIAGNOSIS — Z833 Family history of diabetes mellitus: Secondary | ICD-10-CM

## 2013-08-11 DIAGNOSIS — F1010 Alcohol abuse, uncomplicated: Secondary | ICD-10-CM | POA: Diagnosis present

## 2013-08-11 DIAGNOSIS — Z808 Family history of malignant neoplasm of other organs or systems: Secondary | ICD-10-CM

## 2013-08-11 DIAGNOSIS — F4310 Post-traumatic stress disorder, unspecified: Secondary | ICD-10-CM | POA: Diagnosis present

## 2013-08-11 DIAGNOSIS — F419 Anxiety disorder, unspecified: Secondary | ICD-10-CM | POA: Diagnosis present

## 2013-08-11 DIAGNOSIS — T424X1A Poisoning by benzodiazepines, accidental (unintentional), initial encounter: Secondary | ICD-10-CM

## 2013-08-11 DIAGNOSIS — F339 Major depressive disorder, recurrent, unspecified: Secondary | ICD-10-CM

## 2013-08-11 DIAGNOSIS — F410 Panic disorder [episodic paroxysmal anxiety] without agoraphobia: Secondary | ICD-10-CM | POA: Diagnosis present

## 2013-08-11 DIAGNOSIS — F329 Major depressive disorder, single episode, unspecified: Secondary | ICD-10-CM | POA: Diagnosis present

## 2013-08-11 DIAGNOSIS — Z8371 Family history of colonic polyps: Secondary | ICD-10-CM

## 2013-08-11 DIAGNOSIS — X838XXS Intentional self-harm by other specified means, sequela: Secondary | ICD-10-CM

## 2013-08-11 DIAGNOSIS — F1994 Other psychoactive substance use, unspecified with psychoactive substance-induced mood disorder: Secondary | ICD-10-CM | POA: Diagnosis present

## 2013-08-11 DIAGNOSIS — F332 Major depressive disorder, recurrent severe without psychotic features: Secondary | ICD-10-CM | POA: Diagnosis present

## 2013-08-11 DIAGNOSIS — F1320 Sedative, hypnotic or anxiolytic dependence, uncomplicated: Principal | ICD-10-CM | POA: Diagnosis present

## 2013-08-11 DIAGNOSIS — G43909 Migraine, unspecified, not intractable, without status migrainosus: Secondary | ICD-10-CM | POA: Diagnosis present

## 2013-08-11 DIAGNOSIS — K219 Gastro-esophageal reflux disease without esophagitis: Secondary | ICD-10-CM | POA: Diagnosis present

## 2013-08-11 DIAGNOSIS — F17200 Nicotine dependence, unspecified, uncomplicated: Secondary | ICD-10-CM | POA: Diagnosis present

## 2013-08-11 LAB — BASIC METABOLIC PANEL
BUN: 10 mg/dL (ref 6–23)
CO2: 22 mEq/L (ref 19–32)
Calcium: 8.8 mg/dL (ref 8.4–10.5)
Chloride: 101 mEq/L (ref 96–112)
Creatinine, Ser: 0.57 mg/dL (ref 0.50–1.10)
GFR calc Af Amer: >90 mL/min (ref >90)
GFR calc non Af Amer: >90 mL/min (ref >90)
Glucose, Bld: 122 mg/dL — ABNORMAL HIGH (ref 70–99)
Potassium: 4.3 mEq/L (ref 3.5–5.1)
Sodium: 136 mEq/L (ref 135–145)

## 2013-08-11 MED ORDER — LORATADINE 10 MG PO TABS
10.0000 mg | ORAL_TABLET | Freq: Every day | ORAL | Status: DC
  Administered 2013-08-12 – 2013-08-15 (×4): 10 mg via ORAL
  Filled 2013-08-11 (×7): qty 1

## 2013-08-11 MED ORDER — SUMATRIPTAN SUCCINATE 25 MG PO TABS
25.0000 mg | ORAL_TABLET | ORAL | Status: DC | PRN
  Administered 2013-08-12 – 2013-08-15 (×6): 25 mg via ORAL
  Filled 2013-08-11 (×9): qty 1

## 2013-08-11 MED ORDER — METHOCARBAMOL 500 MG PO TABS
500.0000 mg | ORAL_TABLET | Freq: Four times a day (QID) | ORAL | Status: DC
  Administered 2013-08-11 – 2013-08-15 (×15): 500 mg via ORAL
  Filled 2013-08-11 (×28): qty 1

## 2013-08-11 MED ORDER — METRONIDAZOLE 500 MG PO TABS: 500.0000 mg | ORAL_TABLET | Freq: Three times a day (TID) | ORAL | Status: DC

## 2013-08-11 MED ORDER — METHOCARBAMOL 500 MG PO TABS: 500.0000 mg | ORAL_TABLET | Freq: Four times a day (QID) | ORAL | Status: DC

## 2013-08-11 MED ORDER — MAGNESIUM HYDROXIDE 400 MG/5ML PO SUSP: 30.0000 mL | Freq: Every day | ORAL | Status: DC | PRN

## 2013-08-11 MED ORDER — ATENOLOL 100 MG PO TABS
100.0000 mg | ORAL_TABLET | Freq: Every day | ORAL | Status: DC
  Administered 2013-08-11 – 2013-08-14 (×4): 100 mg via ORAL
  Filled 2013-08-11 (×2): qty 1
  Filled 2013-08-11: qty 4
  Filled 2013-08-11 (×4): qty 1

## 2013-08-11 MED ORDER — ACETAMINOPHEN 325 MG PO TABS: 650.0000 mg | ORAL_TABLET | Freq: Four times a day (QID) | ORAL | Status: DC | PRN

## 2013-08-11 MED ORDER — METRONIDAZOLE 500 MG PO TABS
500.0000 mg | ORAL_TABLET | Freq: Three times a day (TID) | ORAL | Status: DC
  Administered 2013-08-11 – 2013-08-15 (×12): 500 mg via ORAL
  Filled 2013-08-11 (×14): qty 1
  Filled 2013-08-11: qty 2
  Filled 2013-08-11: qty 1
  Filled 2013-08-11: qty 2
  Filled 2013-08-11 (×2): qty 1

## 2013-08-11 MED ORDER — PANTOPRAZOLE SODIUM 40 MG PO TBEC: 40.0000 mg | DELAYED_RELEASE_TABLET | Freq: Every day | ORAL | Status: DC

## 2013-08-11 MED ORDER — NAPROXEN 500 MG PO TABS: 500.0000 mg | ORAL_TABLET | Freq: Two times a day (BID) | ORAL | Status: DC

## 2013-08-11 MED ORDER — SUMATRIPTAN SUCCINATE 25 MG PO TABS: 25.0000 mg | ORAL_TABLET | ORAL | Status: DC | PRN

## 2013-08-11 MED ORDER — TRAZODONE HCL 50 MG PO TABS
50.0000 mg | ORAL_TABLET | Freq: Every evening | ORAL | Status: DC | PRN
  Filled 2013-08-11: qty 6
  Filled 2013-08-11: qty 1

## 2013-08-11 MED ORDER — PANTOPRAZOLE SODIUM 40 MG PO TBEC
40.0000 mg | DELAYED_RELEASE_TABLET | Freq: Every day | ORAL | Status: DC
  Administered 2013-08-12 – 2013-08-15 (×4): 40 mg via ORAL
  Filled 2013-08-11 (×7): qty 1

## 2013-08-11 NOTE — Progress Notes (Signed)
Discharge instructions accompanied patient, left the unit in stable condition accompanied by officer from [facility], transferred to [facility].

## 2013-08-11 NOTE — Progress Notes (Signed)
Received call from Unk Lightning (Social Work) with request to review chart for appropriateness to discontinue enteric precautions so patient can be transferred to an inpatient psychiatric unit. Upon chart review and discussion with Nurse Angela Adam, patient has been on Flagyl since Dec. 6th, has not had any diarrhea for > 48 hours, and had a formed stool today. After consulting with Infection Prevention manager, Marena Chancy, decision made that enteric precautions may be discontinued upon discharge and patient is to shower and don clean clothing immediately prior to transfer. For questions, please call Seabron Spates, Infection Prevention Specialist at [phone]. Updated nurse Angela Adam and Social Worker Unk Lightning via phone.

## 2013-08-11 NOTE — Tx Team (Signed)
Initial Interdisciplinary Treatment Plan  PATIENT STRENGTHS: (choose at least two) Ability for insight Average or above average intelligence Capable of independent living Supportive family/friends  PATIENT STRESSORS: Financial difficulties Health problems   PROBLEM LIST: Problem List/Patient Goals Date to be addressed Date deferred Reason deferred Estimated date of resolution  Suicidal Ideation 08/11/13                                                      DISCHARGE CRITERIA:  Ability to meet basic life and health needs Improved stabilization in mood, thinking, and/or behavior Verbal commitment to aftercare and medication compliance  PRELIMINARY DISCHARGE PLAN: Attend aftercare/continuing care group Return to previous living arrangement  PATIENT/FAMIILY INVOLVEMENT: This treatment plan has been presented to and reviewed with the patient, ELLYANNA HOLTON, and/or family member, .  The patient and family have been given the opportunity to ask questions and make suggestions.  Dhanvi Boesen, [place] 08/11/2013, 8:12 PM

## 2013-08-11 NOTE — Progress Notes (Signed)
NEURO HOSPITALIST PROGRESS NOTE   SUBJECTIVE:                                                                                                                         Patient is sleeping comfortably when I enter room. States Depakote helped significantly.  Recently had Imitrex which also helped. Rates HA 2/10 now.  OBJECTIVE:                                                                                                                           Vital signs in last 24 hours: Temp:  [97.8 F (36.6 C)-98.5 F (36.9 C)] 98.2 F (36.8 C) (12/11 0656) Pulse Rate:  [78-85] 85 (12/11 0656) Resp:  [10-20] 16 (12/11 0656) BP: (100-146)/(63-90) 108/78 mmHg (12/11 0656) SpO2:  [91 %-98 %] 96 % (12/11 0656) Weight:  [80.6 kg (177 lb 11.1 oz)] 80.6 kg (177 lb 11.1 oz) (12/11 0500)  Intake/Output from previous day: 12/10 0701 - 12/11 0700 In: 360 [P.O.:360] Out: -  Intake/Output this shift: Total I/O In: 240 [P.O.:240] Out: -  Nutritional status: General  Past Medical History  Diagnosis Date  . Migraine, unspecified, without mention of intractable migraine without mention of status migrainosus   . Vomiting alone   . Irritable bowel syndrome   . Esophageal reflux   . Diarrhea   . Panic disorder without agoraphobia   . Hypothyroid   . Depressive disorder, not elsewhere classified   . Depression   . Anxiety       Neurologic Exam:  Mental Status: Alert, oriented, thought content appropriate.  Speech fluent without evidence of aphasia.  Able to follow 3 step commands without difficulty. Cranial Nerves: II: Discs flat bilaterally; Visual fields grossly normal, pupils equal, round, reactive to light and accommodation III,IV, VI: ptosis not present, extra-ocular motions intact bilaterally V,VII: smile symmetric, facial light touch sensation normal bilaterally VIII: hearing normal bilaterally IX,X: gag reflex present XI: bilateral shoulder  shrug XII: midline tongue extension without atrophy or fasciculations  Motor: Right : Upper extremity   5/5    Left:     Upper extremity   5/5  Lower extremity   5/5     Lower extremity   5/5  Tone and bulk:normal tone throughout; no atrophy noted Sensory: Pinprick and light touch intact throughout, bilaterally Deep Tendon Reflexes:  Right: Upper Extremity   Left: Upper extremity   biceps (C-5 to C-6) 2/4   biceps (C-5 to C-6) 2/4 tricep (C7) 2/4    triceps (C7) 2/4 Brachioradialis (C6) 2/4  Brachioradialis (C6) 2/4  Lower Extremity Lower Extremity  quadriceps (L-2 to L-4) 2/4   quadriceps (L-2 to L-4) 2/4 Achilles (S1) 2/4   Achilles (S1) 2/4  Plantars: Right: downgoing   Left: downgoing Cerebellar: normal finger-to-nose,  normal heel-to-shin test CV: pulses palpable throughout    Lab Results: No results found for this basename: cbc, bmp, coags, chol, tri, ldl, hga1c   Lipid Panel No results found for this basename: CHOL, TRIG, HDL, CHOLHDL, VLDL, LDLCALC,  in the last 72 hours  Studies/Results: No results found.  MEDICATIONS                                                                                                                        Scheduled: . heparin  5,000 Units Subcutaneous Q8H  . methocarbamol  500 mg Oral Q6H  . metroNIDAZOLE  500 mg Oral Q8H  . naproxen  500 mg Oral BID WC  . pantoprazole  40 mg Oral Daily    ASSESSMENT/PLAN:                                                                                                            37 YO female with chronic daily HA for 6 months. Probable mixed headache, including features of tension as well as possible vascular headache. Rates HA 2/10 and resting comfortably.   Recommend:  Continue current regime. No further recommendations. Neurology S/O   Assessment and plan discussed with with attending physician and they are in agreement.    Felicie Morn PA-C [facility] [phone]  08/11/2013, 9:12 AM

## 2013-08-11 NOTE — Progress Notes (Signed)
Clinical Social Work  Patient accepted to [facility] room 300-2. RN to call report to [phone]. Patient to shower and put on clean clothes prior to DC. [facility] can accept patient after 5:30pm. CSW informed RN, patient, and MD of plans and all parties agreeable. [facility] to transport patient since she is placed under IVC. RN to call [phone] when ready for [facility] to transport. RN is aware of all plans.  CSW is signing off but available if further needs arise.  [place], [place] [phone]

## 2013-08-11 NOTE — Progress Notes (Signed)
Clinical Social Work  CSW called [facility] again in order to inquire about available beds. [facility] reports referral still has not been reviewed but reports unsure if any beds are available at this time. Admissions will call CSW back once they have determined if they can accept patient.  [place], [place] [phone]

## 2013-08-11 NOTE — Discharge Summary (Signed)
Physician Discharge Summary  MIKELL CAMP ZOX:[id number] DOB: 1975/12/19 DOA: 08/05/2013  PCP: Nadean Corwin, MD  Admit date: 08/05/2013 Discharge date: 08/11/2013  Time spent: > 35 minutes  Recommendations for Outpatient Follow-up:  1. Patient will need to complete a ten-day course of oral antibiotics for her recent C. difficile infection 2. Will need continued followup with neurologist for management of migraine headaches  Discharge Diagnoses:  Principal Problem:   Suicide attempt by beta blocker overdose Active Problems:   DEPRESSION   Benzodiazepine overdose   Elevated liver enzymes   Overdose   Clostridium difficile colitis   Discharge Condition: Stable  Diet recommendation: Regular diet  Filed Weights   08/09/13 0828 08/10/13 0500 08/11/13 0500  Weight: 81.375 kg (179 lb 6.4 oz) 80.9 kg (178 lb 5.6 oz) 80.6 kg (177 lb 11.1 oz)    History of present illness:  Patient is a 37 year old who presented to the hospital after an intentional overdose with beta blocker and benzodiazepines. During her hospital stay was found to have C. difficile and as such was started on treatment.  Hospital Course:  #1 intentional benzodiazepine overdose/beta blocker overdose  Resolved. - Plan is to discharge patient to behavioral health hospital for further evaluation and recommendations from psychiatrist  #2 migraine headaches  - Imitrex as needed.  - Neurology initially consulted - No headaches on day of discharge  #3 anxiety  Started on low-dose Xanax as needed.  - Will defer further management to psychiatrist  #4 C. difficile colitis  Patient denies any loose watery stools states that they're more formed now.  Continue Flagyl to complete a ten-day course.   #5 relative adrenal insufficiency  Cortisol level of 4.9  Will need repeat labs as outpatient.  - If remains symptomatic may need adrenal stimulation testing.   #6 transient hypotension  Likely secondary to  volume depletion secondary to hypovolemia in the setting of diarrhea. Clinically improved. Patient is mentating well. Goal systolic blood pressure greater than 90. -Has resolved  Procedures:  None  Consultations:  Psychiatry  Neurology  Discharge Exam: Filed Vitals:   08/11/13 1346  BP: 106/74  Pulse: 80  Temp: 98.2 F (36.8 C)  Resp: 18    General: Patient in no acute distress, alert and awake Cardiovascular: Regular rate and rhythm, no murmurs rubs or gallops Respiratory: Clear to auscultation, no rales, no wheezes  Discharge Instructions  Discharge Orders   Future Orders Complete By Expires   Call MD for:  persistant nausea and vomiting  As directed    Call MD for:  severe uncontrolled pain  As directed    Call MD for:  temperature >100.4  As directed    Diet - low sodium heart healthy  As directed    Increase activity slowly  As directed        Medication List    STOP taking these medications       citalopram 40 MG tablet  Commonly known as:  CELEXA     meloxicam 15 MG tablet  Commonly known as:  MOBIC     promethazine 25 MG tablet  Commonly known as:  PHENERGAN      TAKE these medications       ALPRAZolam 1 MG tablet  Commonly known as:  XANAX  Take 1 mg by mouth 3 (three) times daily.     atenolol 100 MG tablet  Commonly known as:  TENORMIN  Take 100 mg by mouth at bedtime.     cetirizine  10 MG tablet  Commonly known as:  ZYRTEC  Take 10 mg by mouth daily.     methocarbamol 500 MG tablet  Commonly known as:  ROBAXIN  Take 1 tablet (500 mg total) by mouth every 6 (six) hours.     metroNIDAZOLE 500 MG tablet  Commonly known as:  FLAGYL  Take 1 tablet (500 mg total) by mouth every 8 (eight) hours.     naproxen 500 MG tablet  Commonly known as:  NAPROSYN  Take 1 tablet (500 mg total) by mouth 2 (two) times daily with a meal.     ondansetron 4 MG disintegrating tablet  Commonly known as:  ZOFRAN ODT  Take 1 tablet (4 mg total) by mouth  every 8 (eight) hours as needed for nausea.     pantoprazole 40 MG tablet  Commonly known as:  PROTONIX  Take 1 tablet (40 mg total) by mouth daily.     SUMAtriptan 25 MG tablet  Commonly known as:  IMITREX  Take 1 tablet (25 mg total) by mouth every 2 (two) hours as needed for migraine or headache. May repeat in 2 hours if headache persists or recurs.       Allergies  Allergen Reactions  . Dust Mite Extract Swelling  . Medrol [Methylprednisolone]   . Novocain [Procaine]     unknown  . Procaine Hcl Palpitations      The results of significant diagnostics from this hospitalization (including imaging, microbiology, ancillary and laboratory) are listed below for reference.    Significant Diagnostic Studies: Dg Chest Port 1 View  08/07/2013   CLINICAL DATA:  Beta-blocker overdose, line placement  EXAM: PORTABLE CHEST - 1 VIEW  COMPARISON:  Prior chest x-ray as part of acute abdominal series 12 12/2011  FINDINGS: Right IJ approach central venous catheter. The tip projects over the upper right atrium approximately 2.4 cm deep to the superior cavoatrial junction. Cardiac and mediastinal contours are within normal limits. Inspiratory volumes are very low resulting in crowding of the pulmonary vasculature and mild bibasilar subsegmental atelectasis. No pneumothorax. No acute osseous abnormality.  IMPRESSION: 1. Right IJ central venous catheter with the tip projecting over the upper right atrium. No evidence of complicating pneumothorax. 2. Very low inspiratory volumes with associated crowding of the pulmonary vasculature and mild bibasilar atelectasis.   Electronically Signed   By: Malachy Moan M.D.   On: 08/07/2013 08:20    Microbiology: Recent Results (from the past 240 hour(s))  MRSA PCR SCREENING     Status: None   Collection Time    08/06/13  2:18 AM      Result Value Range Status   MRSA by PCR NEGATIVE  NEGATIVE Final   Comment:            The GeneXpert MRSA Assay (FDA      approved for NASAL specimens     only), is one component of a     comprehensive MRSA colonization     surveillance program. It is not     intended to diagnose MRSA     infection nor to guide or     monitor treatment for     MRSA infections.  CLOSTRIDIUM DIFFICILE BY PCR     Status: Abnormal   Collection Time    08/06/13 10:05 AM      Result Value Range Status   C difficile by pcr POSITIVE (*) NEGATIVE Final   Comment: CRITICAL RESULT CALLED TO, READ BACK BY AND VERIFIED WITH:  C.BONGEL,MLT 1310 08/06/13 M.CAMPBELL     CRITICAL RESULT CALLED TO, READ BACK BY AND VERIFIED WITH:     A.ARNOLD RN AT 1313 ON [id number] BY C.BONGEL     Labs: Basic Metabolic Panel:  Recent Labs Lab 08/05/13 2159 08/06/13 0340 08/07/13 0358 08/10/13 0520 08/11/13 0520  NA 139 143 136 135 136  K 3.7 3.6 4.2 3.8 4.3  CL 104 107 108 100 101  CO2 22 25 20 25 22   GLUCOSE 110* 144* 145* 116* 122*  BUN 10 10 5* 8 10  CREATININE 0.52 0.52 0.52 0.66 0.57  CALCIUM 8.3* 7.9* 6.8* 8.6 8.8  MG  --  1.4* 1.7  --   --   PHOS  --  2.8 2.9  --   --    Liver Function Tests:  Recent Labs Lab 08/05/13 2159 08/07/13 0358  AST 43* 26  ALT 53* 48*  ALKPHOS 100 59  BILITOT <0.1* 0.2*  PROT 6.3 4.9*  ALBUMIN 3.4* 2.6*   No results found for this basename: LIPASE, AMYLASE,  in the last 168 hours No results found for this basename: AMMONIA,  in the last 168 hours CBC:  Recent Labs Lab 08/05/13 2159 08/06/13 0340 08/07/13 0358  WBC 8.9 9.1 11.1*  NEUTROABS 5.3  --   --   HGB 12.8 11.5* 10.9*  HCT 37.5 33.7* 32.9*  MCV 91.0 90.8 92.7  PLT 300 276 295   Cardiac Enzymes:  Recent Labs Lab 08/07/13 0358 08/07/13 1420  TROPONINI <0.30 <0.30   BNP: BNP (last 3 results) No results found for this basename: PROBNP,  in the last 8760 hours CBG: No results found for this basename: GLUCAP,  in the last 168 hours     Signed:  Penny Pia  [facility] 08/11/2013, 5:05 PM

## 2013-08-11 NOTE — Progress Notes (Signed)
37 year old female pt admitted on involuntary basis. Pt reports that she took an overdose of her medications and did endorse that it was a suicide attempt and cited migraine pain as the reason. Pt stated that she was in her house for weeks, not being able to work or to do anything because she was having severe migraine pain when she took the overdose. Pt stated that is not suicidal on admission and able to contract for safety. Pt spoke about the pain being the main cause and if she can get her migraines under control that she wouldn't be suicidal. Pt was oriented to the unit and safety maintained.

## 2013-08-11 NOTE — Progress Notes (Signed)
Clinical Social Work  Patient was discussed during Long LOS meeting and Dr. Jacky Kindle (medical director) suggested that it be investigated if patient can be taken off isolation precautions. CSW spoke with Vernona Rieger in ID who reports she will have to review chart and speak with RN to determine if patient can be removed from isolation precautions. CSW updated patient, RN and MD on plans.  CSW met with patient at bedside. Patient reports she is feeling better but getting cabin fever from being in the hospital for several days. CSW explained that inpatient facilities are still being researched. CSW agreeable to inpatient and understanding of psych MD recommendations. Patient reports headaches are decreasing and she is feeling less depressed due to improving medical conditions.  MD entered to complete exam. CSW agreeable to continue to follow.  [place], [place] [phone]

## 2013-08-11 NOTE — Progress Notes (Signed)
[facility] Group Notes:  (Nursing/MHT/Case Management/Adjunct)  Date:  08/11/2013  Time:  2100  Type of Therapy:  wrap up group  Participation Level:  Active  Participation Quality:  Appropriate, Attentive, Sharing and Supportive  Affect:  Appropriate  Cognitive:  Appropriate  Insight:  Appropriate  Engagement in Group:  Engaged  Modes of Intervention:  Clarification, Education and Support  Summary of Progress/Problems: Pt reports being here after an overdose due to a 38 day migraine headache. Pt stated " I was sick and tired of being sick and tired". Pt reported that she called for help immediately after overdose. Pt shared that she was abusing alcohol recently due to her situation but doesn't feel like alcohol is a problem for her.  Shelah Lewandowsky 08/11/2013, 10:19 PM

## 2013-08-12 ENCOUNTER — Encounter (HOSPITAL_COMMUNITY): Payer: Self-pay | Admitting: Psychiatry

## 2013-08-12 DIAGNOSIS — F1010 Alcohol abuse, uncomplicated: Secondary | ICD-10-CM | POA: Diagnosis present

## 2013-08-12 DIAGNOSIS — T448X1A Poisoning by centrally-acting and adrenergic-neuron-blocking agents, accidental (unintentional), initial encounter: Secondary | ICD-10-CM

## 2013-08-12 DIAGNOSIS — F4310 Post-traumatic stress disorder, unspecified: Secondary | ICD-10-CM

## 2013-08-12 DIAGNOSIS — T424X4A Poisoning by benzodiazepines, undetermined, initial encounter: Secondary | ICD-10-CM

## 2013-08-12 DIAGNOSIS — T50992A Poisoning by other drugs, medicaments and biological substances, intentional self-harm, initial encounter: Secondary | ICD-10-CM

## 2013-08-12 DIAGNOSIS — F410 Panic disorder [episodic paroxysmal anxiety] without agoraphobia: Secondary | ICD-10-CM

## 2013-08-12 DIAGNOSIS — F332 Major depressive disorder, recurrent severe without psychotic features: Secondary | ICD-10-CM

## 2013-08-12 DIAGNOSIS — F411 Generalized anxiety disorder: Secondary | ICD-10-CM

## 2013-08-12 DIAGNOSIS — T43502A Poisoning by unspecified antipsychotics and neuroleptics, intentional self-harm, initial encounter: Secondary | ICD-10-CM

## 2013-08-12 MED ORDER — ONDANSETRON 4 MG PO TBDP: 4.0000 mg | ORAL_TABLET | Freq: Three times a day (TID) | ORAL | Status: DC | PRN

## 2013-08-12 MED ORDER — ALBUTEROL SULFATE HFA 108 (90 BASE) MCG/ACT IN AERS: 2.0000 | INHALATION_SPRAY | RESPIRATORY_TRACT | Status: DC | PRN

## 2013-08-12 MED ORDER — CYCLOBENZAPRINE HCL 5 MG PO TABS
5.0000 mg | ORAL_TABLET | Freq: Once | ORAL | Status: DC
  Filled 2013-08-12: qty 1

## 2013-08-12 MED ORDER — CHLORDIAZEPOXIDE HCL 25 MG PO CAPS
25.0000 mg | ORAL_CAPSULE | Freq: Three times a day (TID) | ORAL | Status: DC | PRN
  Administered 2013-08-12 – 2013-08-14 (×3): 25 mg via ORAL
  Filled 2013-08-12 (×3): qty 1

## 2013-08-12 MED ORDER — HYDROXYZINE HCL 25 MG PO TABS
25.0000 mg | ORAL_TABLET | Freq: Four times a day (QID) | ORAL | Status: DC | PRN
  Administered 2013-08-12 – 2013-08-14 (×3): 25 mg via ORAL
  Filled 2013-08-12: qty 1
  Filled 2013-08-12: qty 6
  Filled 2013-08-12 (×2): qty 1

## 2013-08-12 MED ORDER — PROCHLORPERAZINE MALEATE 5 MG PO TABS
5.0000 mg | ORAL_TABLET | Freq: Four times a day (QID) | ORAL | Status: DC | PRN
  Administered 2013-08-12 – 2013-08-15 (×5): 5 mg via ORAL
  Filled 2013-08-12 (×2): qty 1

## 2013-08-12 NOTE — Tx Team (Signed)
Interdisciplinary Treatment Plan Update (Adult)  Date: 08/12/2013  Time Reviewed:  9:45 AM  Progress in Treatment: Attending groups: Yes Participating in groups:  Yes Taking medication as prescribed:  Yes Tolerating medication:  Yes Family/Significant othe contact made: CSW assessing Patient understands diagnosis:  Yes Discussing patient identified problems/goals with staff:  Yes Medical problems stabilized or resolved:  Yes Denies suicidal/homicidal ideation: Yes Issues/concerns per patient self-inventory:  Yes Other:  New problem(s) identified: N/A  Discharge Plan or Barriers: CSW assessing for appropriate referrals.    Reason for Continuation of Hospitalization: Anxiety Depression Medication Stabilization  Comments: N/A  Estimated length of stay: 3-5 days  For review of initial/current patient goals, please see plan of care.  Attendees: Patient:     Family:     Physician:  Dr. Lugo 08/12/2013 10:08 AM   Nursing:   Linsey Squires, RN 08/12/2013 10:08 AM   Clinical Social Worker:  Keyonda Bickle Horton, LCSW 08/12/2013 10:08 AM   Other: Brittany Tyson, RN 08/12/2013 10:09 AM   Other: Aggie Nwoko, NP 08/12/2013 10:09 AM   Other:    Other:     Other:    Other:    Other:    Other:    Other:    Other:     Scribe for Treatment Team:   Horton, Erlean Mealor Nicole, 08/12/2013 , 10:08 AM     

## 2013-08-12 NOTE — Progress Notes (Signed)
Patient ID: Lori Marshall, female   DOB: 03/13/1976, 37 y.o.   MRN: [id number] Pt attended 10 am group and participated openly and honestly with group.

## 2013-08-12 NOTE — [facility] Group Notes (Signed)
[facility] LCSW Group Therapy  08/12/2013  1:15 PM   Type of Therapy:  Group Therapy  Participation Level:  Active  Participation Quality:  Appropriate, Attentive, Sharing and Supportive  Affect:  Calm  Cognitive:  Alert and Oriented  Insight:  Developing/Improving and Engaged  Engagement in Therapy:  Developing/Improving and Engaged  Modes of Intervention:  Clarification, Confrontation, Discussion, Education, Exploration, Limit-setting, Orientation, Problem-solving, Rapport Building, Dance movement psychotherapist, Socialization and Support  Summary of Progress/Problems: The topic for today was feelings about relapse.  Pt discussed what relapse prevention is to them and identified triggers that they are on the path to relapse.  Pt processed their feeling towards relapse and was able to relate to peers.  Pt discussed coping skills that can be used for relapse prevention.   Pt was able to process the reason for this admission.  Pt stated that she planned to stay on her medication regimen to prevent future relapses.  Pt actively participated and was engaged in group discussion.    Reyes Ivan, LCSW 08/12/2013 3:01 PM
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
## 2013-08-12 NOTE — Progress Notes (Signed)
D.  Pt pleasant on approach, denies complaints at this time other than anxiety and some insomnia.  She requested medication for this.  Denies SI/HI/hallucinations at this time.  Interacting appropriately within milieu.  A.  Support and encouragement offered, medication given as ordered  R. Pt remains safe on unit, will continue to monitor.

## 2013-08-12 NOTE — [facility] Suicide Risk Assessment (Signed)
[facility] INPATIENT:  Family/Significant Other Suicide Prevention Education  Suicide Prevention Education:  Education Completed; Reatha Armour, mother, [phone] has been identified by the patient as the family member/significant other with whom the patient will be residing, and identified as the person(s) who will aid the patient in the event of a mental health crisis (suicidal ideations/suicide attempt).  With written consent from the patient, the family member/significant other has been provided the following suicide prevention education, prior to the and/or following the discharge of the patient.  The suicide prevention education provided includes the following:  Suicide risk factors  Suicide prevention and interventions  National Suicide Hotline telephone number  [facility] assessment telephone number  [facility] Emergency Assistance 911  [facility] and/or Residential Mobile Crisis Unit telephone number  Request made of family/significant other to:  Remove weapons (e.g., guns, rifles, knives), all items previously/currently identified as safety concern.    Remove drugs/medications (over-the-counter, prescriptions, illicit drugs), all items previously/currently identified as a safety concern.  The family member/significant other verbalizes understanding of the suicide prevention education information provided.  The family member/significant other agrees to remove the items of safety concern listed above.  Daryel Gerald B 08/12/2013, 3:13 PM

## 2013-08-12 NOTE — Progress Notes (Addendum)
D: Patient denies SI/HI and A/V hallucinations; patient reports sleep is poor; reports appetite is improving ; reports energy level is normal ; reports ability to pay attention is good; rates depression as 1/10; rates hopelessness 1/10;   A: Monitored q 15 minutes; patient encouraged to attend groups; patient educated about medications; patient given medications per physician orders; patient encouraged to express feelings and/or concerns; patient is on enteric precautions  R: Patient is calm and cooperative; patient is appropriate to circumstances; patient's interaction with staff and peers is appropriate but minimal; patient was able to set goal to talk with staff 1:1 when having feelings of SI; patient is taking medications as prescribed and tolerating medications; patient is attending all groups and engaging

## 2013-08-12 NOTE — [facility] Suicide Risk Assessment (Signed)
Suicide Risk Assessment  Admission Assessment     Nursing information obtained from:    Demographic factors:    Current Mental Status:    Loss Factors:    Historical Factors:    Risk Reduction Factors:     CLINICAL FACTORS:   Severe Anxiety and/or Agitation Depression:   Comorbid alcohol abuse/dependence Alcohol/Substance Abuse/Dependencies Medical Diagnoses and Treatments/Surgeries  COGNITIVE FEATURES THAT CONTRIBUTE TO RISK:  Closed-mindedness Polarized thinking Thought constriction (tunnel vision)    SUICIDE RISK:   Moderate:  Frequent suicidal ideation with limited intensity, and duration, some specificity in terms of plans, no associated intent, good self-control, limited dysphoria/symptomatology, some risk factors present, and identifiable protective factors, including available and accessible social support.  PLAN OF CARE: Supportive approach/copign skills/relapse prevention                               Identify detox needs                               Reases her symptoms/mood optimize treatment with psychotropics  I certify that inpatient services furnished can reasonably be expected to improve the patient's condition.  Nora Rooke A 08/12/2013, 3:52 PM

## 2013-08-12 NOTE — [facility] Group Notes (Signed)
[facility] LCSW Aftercare Discharge Planning Group Note   08/12/2013 8:45 AM  Participation Quality:  Alert, Appropriate and Oriented  Mood/Affect: Flat and Depressed   Depression Rating:  0  Anxiety Rating:  5  Thoughts of Suicide:  Pt denies SI/HI  Will you contract for safety?   Yes  Current AVH:  Pt denies  Plan for Discharge/Comments:  Pt attended discharge planning group and actively participated in group.  CSW provided pt with today's workbook.  Pt states that she plans to stay on her medication.  Pt discussed finally finding a regimen that works for her migraines.  Pt states that she will return home in [place] and is interested in follow up.  CSW will assess for appropriate follow up.  No further needs voiced by pt at this time.    Transportation Means: Pt reports access to transportation - family will pick pt up  Supports: No supports mentioned at this time  Lori Ivan, LCSW 08/12/2013 10:01 AM

## 2013-08-13 NOTE — Progress Notes (Signed)
D . Pt pleasant on approach, complaint of anxiety and some continued headache.  Bright affect.  Denies SI/HI/hallucinations at this time.  Interacting appropriately within milieu.  Positive for evening wrap up group.  A.  Support and encouragement offered, medication given as requested and ordered.  R.  Pt remains safe on unit, will continue to monitor.

## 2013-08-13 NOTE — Progress Notes (Signed)
[facility] Group Notes:  (Nursing/MHT/Case Management/Adjunct)  Date:  08/13/2013  Time:  2100  Type of Therapy:  wrap up group  Participation Level:  Active  Participation Quality:  Appropriate, Attentive, Sharing and Supportive  Affect:  Appropriate  Cognitive:  Alert and Appropriate  Insight:  Good  Engagement in Group:  Engaged  Modes of Intervention:  Clarification, Education and Support  Summary of Progress/Problems:   Lori Marshall 08/13/2013, 10:09 PM

## 2013-08-13 NOTE — [facility] Group Notes (Signed)
[facility] Group Notes:  (Clinical Social Work)  08/13/2013     10-11AM  Summary of Progress/Problems:   The main focus of today's process group was for the patient to identify ways in which they have in the past sabotaged their own recovery. Motivational Interviewing was utilized to ask the group members what they get out of their substance use, and what reasons they may have for wanting to change.  The Stages of Change were explained using a handout, and patients identified where they currently are with regard to stages of change.  The patient expressed that she does not abuse substances, and that her self-sabotaging behavior is "maximizing" issues or creating something bigger out of them, which results in panic attacks.  She talked about having migraines for 38 days, then taking 30 Atenolol, denied this was a suicide attempt but admitted she was medically hospitalized for it.  She said she becomes depressed and hopeless, and feels like the walls are caving in on her.  She feels she is in Preparation Stage of change, and is preparing by getting on the right medications, agreeing to see a counselor so she will have someone to talk to instead of letting issues get so big, being thankful for the things she has to be grateful, and going back to work.  Type of Therapy:  Group Therapy - Process   Participation Level:  Active  Participation Quality:  Attentive and Sharing  Affect:  Depressed  Cognitive:  Appropriate  Insight:  Developing/Improving  Engagement in Therapy:  Engaged  Modes of Intervention:  Education, Support and Processing, Motivational Interviewing  Ambrose Mantle, LCSW 08/13/2013, 12:57 PM

## 2013-08-13 NOTE — Progress Notes (Signed)
[facility] MD Progress Note  08/13/2013 10:08 AM Lori Marshall  MRN:  [id number] Subjective:  Patient states she "is feeling better", would like to get some fresh air during recreational time.  Sleep "Ok", depression low, 2/10 anxiety, denies suicidal/homicidal ideations and hallucinations.  No headache today.  She continues to deny her serious suicide attempt. Diagnosis:   DSM5:  Substance/Addictive Disorders:  Alcohol Related Disorder - Severe (303.90), Alcohol Intoxication with Use Disorder - Severe (F10.229) and Alcohol Withdrawal (291.81) Depressive Disorders:  Major Depressive Disorder - Severe (296.23)  Axis I: Alcohol Abuse, Anxiety Disorder NOS, Major Depression, Recurrent severe and Panic Disorder Axis II: Deferred Axis III:  Past Medical History  Diagnosis Date  . Migraine, unspecified, without mention of intractable migraine without mention of status migrainosus   . Vomiting alone   . Irritable bowel syndrome   . Esophageal reflux   . Diarrhea   . Panic disorder without agoraphobia   . Hypothyroid   . Depressive disorder, not elsewhere classified   . Depression   . Anxiety    Axis IV: other psychosocial or environmental problems, problems related to social environment and problems with primary support group Axis V: 41-50 serious symptoms  ADL's:  Intact  Sleep: Fair  Appetite:  Fair  Suicidal Ideation:  Denies  Homicidal Ideation:  Denies   Psychiatric Specialty Exam: Review of Systems  Constitutional: Negative.   HENT: Negative.   Eyes: Negative.   Respiratory: Negative.   Cardiovascular: Negative.   Gastrointestinal: Negative.   Genitourinary: Negative.   Musculoskeletal: Negative.   Skin: Negative.   Neurological: Negative.   Endo/Heme/Allergies: Negative.   Psychiatric/Behavioral: Positive for depression and substance abuse. The patient is nervous/anxious.     Blood pressure 113/78, pulse 69, temperature 97.6 F (36.4 C), temperature source Oral,  resp. rate 16, height 5' 5.5" (1.664 m), weight 80.74 kg (178 lb).Body mass index is 29.16 kg/(m^2).  General Appearance: Casual  Eye Contact::  Fair  Speech:  Normal Rate  Volume:  Normal  Mood:  Depressed  Affect:  Congruent  Thought Process:  Coherent  Orientation:  Full (Time, Place, and Person)  Thought Content:  WDL  Suicidal Thoughts:  No  Homicidal Thoughts:  No  Memory:  Immediate;   Fair Recent;   Fair Remote;   Fair  Judgement:  Poor  Insight:  Lacking  Psychomotor Activity:  Decreased  Concentration:  Fair  Recall:  Fair  Akathisia:  No  Handed:  Right  AIMS (if indicated):     Assets:  Physical Health Resilience Social Support  Sleep:  Number of Hours: 5.5   Current Medications: Current Facility-Administered Medications  Medication Dose Route Frequency Provider Last Rate Last Dose  . acetaminophen (TYLENOL) tablet 650 mg  650 mg Oral Q6H PRN Audrea Muscat, NP      . atenolol (TENORMIN) tablet 100 mg  100 mg Oral QHS Audrea Muscat, NP   100 mg at 08/12/13 2129  . chlordiazePOXIDE (LIBRIUM) capsule 25 mg  25 mg Oral TID PRN Nanine Means, NP   25 mg at 08/12/13 2136  . hydrOXYzine (ATARAX/VISTARIL) tablet 25 mg  25 mg Oral Q6H PRN Nanine Means, NP   25 mg at 08/12/13 2136  . loratadine (CLARITIN) tablet 10 mg  10 mg Oral Daily Evanna Janann August, NP   10 mg at 08/13/13 0748  . magnesium hydroxide (MILK OF MAGNESIA) suspension 30 mL  30 mL Oral Daily PRN Evanna Janann August, NP      .  methocarbamol (ROBAXIN) tablet 500 mg  500 mg Oral Q6H Evanna Cori Merry Proud, NP   500 mg at 08/13/13 1001  . metroNIDAZOLE (FLAGYL) tablet 500 mg  500 mg Oral Q8H Evanna Janann August, NP   500 mg at 08/13/13 0601  . ondansetron (ZOFRAN-ODT) disintegrating tablet 4 mg  4 mg Oral Q8H PRN Evanna Janann August, NP      . pantoprazole (PROTONIX) EC tablet 40 mg  40 mg Oral Daily Evanna Cori Merry Proud, NP   40 mg at 08/13/13 0748  . prochlorperazine (COMPAZINE) tablet 5 mg  5 mg Oral  Q6H PRN Rachael Fee, MD   5 mg at 08/13/13 0753  . SUMAtriptan (IMITREX) tablet 25 mg  25 mg Oral Q2H PRN Audrea Muscat, NP   25 mg at 08/13/13 0752  . traZODone (DESYREL) tablet 50 mg  50 mg Oral QHS PRN,MR X 1 Evanna Janann August, NP        Lab Results: No results found for this or any previous visit (from the past 48 hour(s)).  Physical Findings: AIMS: Facial and Oral Movements Muscles of Facial Expression: None, normal Lips and Perioral Area: None, normal Jaw: None, normal Tongue: None, normal,Extremity Movements Upper (arms, wrists, hands, fingers): None, normal Lower (legs, knees, ankles, toes): None, normal, Trunk Movements Neck, shoulders, hips: None, normal, Overall Severity Severity of abnormal movements (highest score from questions above): None, normal Incapacitation due to abnormal movements: None, normal Patient's awareness of abnormal movements (rate only patient's report): No Awareness, Dental Status Current problems with teeth and/or dentures?: No Does patient usually wear dentures?: No  CIWA:  CIWA-Ar Total: 3 COWS:     Treatment Plan Summary: Daily contact with patient to assess and evaluate symptoms and progress in treatment Medication management  Plan:  Review of chart, vital signs, medications, and notes. 1-Individual and group therapy 2-Medication management for depression,substance abuse, and anxiety:  Medications reviewed with the patient and she stated she did not voice any negative side effects, no changes made 3-Coping skills for depression, anxiety, and substance abuse 4-Continue crisis stabilization and management 5-Address health issues--monitoring vital signs, stable 6-Treatment plan in progress to prevent relapse of depression, substance abuse, and anxiety  Medical Decision Making Problem Points:  Established problem, stable/improving (1) and Review of psycho-social stressors (1) Data Points:  Review of medication regiment & side effects  (2)  I certify that inpatient services furnished can reasonably be expected to improve the patient's condition.   Nanine Means, PMH-NP 08/13/2013, 10:08 AM

## 2013-08-13 NOTE — Progress Notes (Signed)
D: Patient denies SI/HI/AVH.  She has a pleasant mood and affect.  Pt,. Reports fair sleep and improving appetite.  She rates her depression and hopelessness both at 1/10.  Pt. Present for group and participated as well.  Pt. States her goal for today is controlling her headaches and "being nice".  Pt. Interacting with others appropriately within the milieu.  A: Patient given emotional support from RN. Patient encouraged to come to staff with concerns and/or questions. Patient's medication routine continued. Patient's orders and plan of care reviewed.   R: Patient remains appropriate and cooperative. Will continue to monitor patient q15 minutes for safety.

## 2013-08-14 DIAGNOSIS — F1010 Alcohol abuse, uncomplicated: Secondary | ICD-10-CM

## 2013-08-14 NOTE — Progress Notes (Signed)
Adult Psychoeducational Group Note  Date:  08/14/2013 Time:  0900  Group Topic/Focus:  Making Healthy Choices:   The focus of this group is to help patients identify negative/unhealthy choices they were using prior to admission and identify positive/healthier coping strategies to replace them upon discharge.  Participation Level:  Active  Participation Quality:  Appropriate and Attentive  Affect:  Appropriate  Cognitive:  Alert and Appropriate  Insight: Appropriate  Engagement in Group:  Engaged  Modes of Intervention:  Discussion and Education  Additional Comments:  Patient was able to identify goals that she wants to work on during her stay here and was able to identify coping strategies in order to prevent relapse.  Lori Marshall 08/14/2013, 9:59 AM

## 2013-08-14 NOTE — Progress Notes (Signed)
Patient ID: Lori Marshall, female   DOB: 05-29-1976, 37 y.o.   MRN: [id number] [facility] MD Progress Note  08/14/2013 11:26 AM TRENT THEISEN  MRN:  [id number]  Subjective: Donni reports that since she started the combination of Robaxin, Imitrex and compazine, her migraine headache pain is improving. She says she is feeling a lot better and feeling like living life again. She adds that she was able to go outside yesterday evening and played basket-ball and will do the same again if an opportunity presents where she has to go outside with the other patients. She says that she has not been out of her room and or drive her car for over 30 days, and looking forward to doing so soon after discharge. Guyla says that she is happy that her suicide attempt failed for she would have hurt a lot of people.  Diagnosis:   DSM5: Substance/Addictive Disorders:  Alcohol Related Disorder - Severe (303.90), Alcohol Intoxication with Use Disorder - Severe (F10.229) and Alcohol Withdrawal (291.81) Depressive Disorders:  Major Depressive Disorder - Severe (296.23)  Axis I: Alcohol Abuse, Anxiety Disorder NOS, Major Depression, Recurrent severe and Panic Disorder Axis II: Deferred Axis III:  Past Medical History  Diagnosis Date  . Migraine, unspecified, without mention of intractable migraine without mention of status migrainosus   . Vomiting alone   . Irritable bowel syndrome   . Esophageal reflux   . Diarrhea   . Panic disorder without agoraphobia   . Hypothyroid   . Depressive disorder, not elsewhere classified   . Depression   . Anxiety    Axis IV: other psychosocial or environmental problems, problems related to social environment and problems with primary support group Axis V: 41-50 serious symptoms  ADL's:  Intact  Sleep: Fair  Appetite:  Fair  Suicidal Ideation:  Denies  Homicidal Ideation:  Denies   Psychiatric Specialty Exam: Review of Systems  Constitutional: Negative.   HENT:  Negative.   Eyes: Negative.   Respiratory: Negative.   Cardiovascular: Negative.   Gastrointestinal: Negative.   Genitourinary: Negative.   Musculoskeletal: Negative.   Skin: Negative.   Neurological: Negative.   Endo/Heme/Allergies: Negative.   Psychiatric/Behavioral: Positive for depression and substance abuse. The patient is nervous/anxious.     Blood pressure 97/69, pulse 67, temperature 97.6 F (36.4 C), temperature source Oral, resp. rate 16, height 5' 5.5" (1.664 m), weight 80.74 kg (178 lb).Body mass index is 29.16 kg/(m^2).  General Appearance: Casual  Eye Contact::  Fair  Speech:  Normal Rate  Volume:  Normal  Mood:  Depressed  Affect:  Congruent  Thought Process:  Coherent  Orientation:  Full (Time, Place, and Person)  Thought Content:  WDL  Suicidal Thoughts:  No  Homicidal Thoughts:  No  Memory:  Immediate;   Fair Recent;   Fair Remote;   Fair  Judgement:  Poor  Insight:  Lacking  Psychomotor Activity:  Decreased  Concentration:  Fair  Recall:  Fair  Akathisia:  No  Handed:  Right  AIMS (if indicated):     Assets:  Physical Health Resilience Social Support  Sleep:  Number of Hours: 5.25   Current Medications: Current Facility-Administered Medications  Medication Dose Route Frequency Provider Last Rate Last Dose  . acetaminophen (TYLENOL) tablet 650 mg  650 mg Oral Q6H PRN Audrea Muscat, NP      . atenolol (TENORMIN) tablet 100 mg  100 mg Oral QHS Audrea Muscat, NP   100 mg at 08/13/13 2214  .  chlordiazePOXIDE (LIBRIUM) capsule 25 mg  25 mg Oral TID PRN Nanine Means, NP   25 mg at 08/13/13 2215  . hydrOXYzine (ATARAX/VISTARIL) tablet 25 mg  25 mg Oral Q6H PRN Nanine Means, NP   25 mg at 08/13/13 2215  . loratadine (CLARITIN) tablet 10 mg  10 mg Oral Daily Evanna Janann August, NP   10 mg at 08/14/13 0734  . magnesium hydroxide (MILK OF MAGNESIA) suspension 30 mL  30 mL Oral Daily PRN Evanna Janann August, NP      . methocarbamol (ROBAXIN) tablet  500 mg  500 mg Oral Q6H Evanna Janann August, NP   500 mg at 08/14/13 0954  . metroNIDAZOLE (FLAGYL) tablet 500 mg  500 mg Oral Q8H Evanna Janann August, NP   500 mg at 08/14/13 0607  . ondansetron (ZOFRAN-ODT) disintegrating tablet 4 mg  4 mg Oral Q8H PRN Evanna Janann August, NP      . pantoprazole (PROTONIX) EC tablet 40 mg  40 mg Oral Daily Evanna Cori Merry Proud, NP   40 mg at 08/14/13 0734  . prochlorperazine (COMPAZINE) tablet 5 mg  5 mg Oral Q6H PRN Rachael Fee, MD   5 mg at 08/14/13 0607  . SUMAtriptan (IMITREX) tablet 25 mg  25 mg Oral Q2H PRN Audrea Muscat, NP   25 mg at 08/14/13 0850  . traZODone (DESYREL) tablet 50 mg  50 mg Oral QHS PRN,MR X 1 Evanna Janann August, NP        Lab Results: No results found for this or any previous visit (from the past 48 hour(s)).  Physical Findings: AIMS: Facial and Oral Movements Muscles of Facial Expression: None, normal Lips and Perioral Area: None, normal Jaw: None, normal Tongue: None, normal,Extremity Movements Upper (arms, wrists, hands, fingers): None, normal Lower (legs, knees, ankles, toes): None, normal, Trunk Movements Neck, shoulders, hips: None, normal, Overall Severity Severity of abnormal movements (highest score from questions above): None, normal Incapacitation due to abnormal movements: None, normal Patient's awareness of abnormal movements (rate only patient's report): No Awareness, Dental Status Current problems with teeth and/or dentures?: No Does patient usually wear dentures?: No  CIWA:  CIWA-Ar Total: 4 COWS:     Treatment Plan Summary: Daily contact with patient to assess and evaluate symptoms and progress in treatment Medication management  Plan: Supportive approach/coping skills/relapse prevention. Encouraged out of room, participation in group sessions and application of coping skills when distressed. Will continue to monitor response to/adverse effects of medications in use to assure  effectiveness. Continue to monitor mood, behavior and interaction with staff and other patients. Discharge plans in progress. Continue current plan of care.  Medical Decision Making Problem Points:  Established problem, stable/improving (1) and Review of psycho-social stressors (1) Data Points:  Review of medication regiment & side effects (2)  I certify that inpatient services furnished can reasonably be expected to improve the patient's condition.   Armandina Stammer I, PMH-NP 08/14/2013, 11:26 AM

## 2013-08-14 NOTE — Progress Notes (Signed)
D.  Pt pleasant on approach, did complain of headache since recreational therapy today.  No acute distress noted.  Denies SI/HI/hallucinations at this time.  Interacting appropriately within milieu.  Positive for evening AA group.  A.  Support and encouragement offered  Medication given as ordered for headache.  R.  Pt remain safe on unit, will continue to monitor.

## 2013-08-14 NOTE — Progress Notes (Signed)
Patient did attend the evening speaker AA meeting.  

## 2013-08-14 NOTE — [facility] Group Notes (Signed)
[facility] Group Notes:  (Clinical Social Work)  08/14/2013  10:00-11:00AM  Summary of Progress/Problems:   The main focus of today's process group was to   identify the patient's current support system and decide on other supports that can be put in place.  The picture on workbook was used to discuss why additional supports are needed, and a hand-out was distributed with four definitions/levels of support, then used to talk about how patients have given and received all different kinds of support.  An emphasis was placed on using counselor, doctor, therapy groups, 12-step groups, and problem-specific support groups to expand supports.   There was also an extensive discussion about what constitutes a healthy support versus an unhealthy support.  The patient expressed full comprehension of the concepts presented, and agreed that there is a need to add more supports.  The patient stated she really wants to find a support group for depression and anxiety (lives in [place]), as she thinks that could be the healthy support piece she is missing.  Also wants to get a therapist and psychiatrist.  Type of Therapy:  Process Group with Motivational Interviewing  Participation Level:  Active  Participation Quality:  Attentive and Sharing  Affect:  Appropriate  Cognitive:  Appropriate and Oriented  Insight:  Engaged  Engagement in Therapy:  Engaged  Modes of Intervention:   Education, Support and Processing, Activity  [employer], LCSW 08/14/2013, 12:15pm

## 2013-08-15 ENCOUNTER — Ambulatory Visit (INDEPENDENT_AMBULATORY_CARE_PROVIDER_SITE_OTHER): Payer: Self-pay | Admitting: Physician Assistant

## 2013-08-15 ENCOUNTER — Encounter: Payer: Self-pay | Admitting: Physician Assistant

## 2013-08-15 VITALS — BP 102/70 | HR 68 | Temp 97.7°F | Resp 16 | Ht 66.0 in | Wt 177.0 lb

## 2013-08-15 DIAGNOSIS — F1994 Other psychoactive substance use, unspecified with psychoactive substance-induced mood disorder: Secondary | ICD-10-CM

## 2013-08-15 DIAGNOSIS — F411 Generalized anxiety disorder: Secondary | ICD-10-CM

## 2013-08-15 DIAGNOSIS — F1910 Other psychoactive substance abuse, uncomplicated: Secondary | ICD-10-CM

## 2013-08-15 MED ORDER — HYDROXYZINE HCL 50 MG PO TABS: 50.0000 mg | ORAL_TABLET | Freq: Three times a day (TID) | ORAL | Status: DC | PRN

## 2013-08-15 MED ORDER — METRONIDAZOLE 500 MG PO TABS: 500.0000 mg | ORAL_TABLET | Freq: Three times a day (TID) | ORAL | Status: DC

## 2013-08-15 MED ORDER — TRAZODONE HCL 50 MG PO TABS: 50.0000 mg | ORAL_TABLET | Freq: Every evening | ORAL | Status: DC | PRN

## 2013-08-15 MED ORDER — SUMATRIPTAN SUCCINATE 25 MG PO TABS: ORAL_TABLET | ORAL | Status: DC

## 2013-08-15 MED ORDER — METRONIDAZOLE 500 MG PO TABS: 500.0000 mg | ORAL_TABLET | Freq: Three times a day (TID) | ORAL | Status: AC

## 2013-08-15 MED ORDER — HYDROXYZINE HCL 25 MG PO TABS: 25.0000 mg | ORAL_TABLET | Freq: Four times a day (QID) | ORAL | Status: DC | PRN

## 2013-08-15 MED ORDER — PANTOPRAZOLE SODIUM 40 MG PO TBEC: 40.0000 mg | DELAYED_RELEASE_TABLET | Freq: Every day | ORAL | Status: DC

## 2013-08-15 MED ORDER — CETIRIZINE HCL 10 MG PO TABS: 10.0000 mg | ORAL_TABLET | Freq: Every day | ORAL | Status: DC

## 2013-08-15 MED ORDER — PROCHLORPERAZINE MALEATE 5 MG PO TABS: 5.0000 mg | ORAL_TABLET | Freq: Four times a day (QID) | ORAL | Status: DC | PRN

## 2013-08-15 NOTE — Tx Team (Signed)
Interdisciplinary Treatment Plan Update (Adult)  Date: 08/15/2013   Time Reviewed:10:05 AM  Progress in Treatment:  Attending groups: Yes  Participating in groups:  Yes  Taking medication as prescribed: Yes  Tolerating medication: Yes  Family/Significant othe contact made: Contact made with pt's mother. SPE completed.   Patient understands diagnosis: Yes, AEB seeking treatment for Xanax overdose/SI and mood stabilization.  Discussing patient identified problems/goals with staff: Yes  Medical problems stabilized or resolved: Yes  Denies suicidal/homicidal ideation: Yes self report.  Patient has not harmed self or Others: Yes  New problem(s) identified:  Discharge Plan or Barriers: Pt to follow up at [facility] for med management and [facility] for therapy. Pt also given info for [facility] for group therapy.  Additional comments: N/A  Reason for Continuation of Hospitalization: d/c today  Estimated length of stay: d/c today  For review of initial/current patient goals, please see plan of care.  Attendees:  Patient:    Family:    Physician: Geoffery Lyons MD 08/15/2013 10:04 AM   Nursing: Idelia Salm RN  08/15/2013 10:04 AM   Clinical Social Worker Kerianne Gurr Smart, LCSWA  08/15/2013 10:04 AM   Other: Quintella Reichert, RN  08/15/2013 10:04 AM   Other: Lupita Leash RN 08/15/2013 10:04 AM   Other:    Other:    Scribe for Treatment Team:  [employer] LCSWA 08/15/2013 10:05 AM

## 2013-08-15 NOTE — Progress Notes (Signed)
[facility] Adult Case Management Discharge Plan :  Will you be returning to the same living situation after discharge: Yes,  home with parents  At discharge, do you have transportation home?:Yes,  parents  Do you have the ability to pay for your medications:Yes,  mental health  Release of information consent forms completed and submitted to Medical Records by CSW.   Patient to Follow up at: Follow-up Information   Follow up with [facility]. (Go to the walk-in clinic M-F between 8 and9AM for your hospital follow up appointment)    Contact information:   [address]  [place] [336] [phone]       Follow up with [facility]  On 08/16/2013. (Appt at [facility] with Rudi Rummage for therapy. )    Contact information:   [facility] [address], [place] [address] Phone: [phone]       Patient denies SI/HI:   Yes,  self report.     Safety Planning and Suicide Prevention discussed:  Yes,  SPE completed with pt's mother. Pt given SPI pamphlet and encouraged to share with her support network, ask questions, and talk about concerns.   Smart, HeatherLCSWA  08/15/2013, 10:07 AM

## 2013-08-15 NOTE — [facility] Suicide Risk Assessment (Signed)
Suicide Risk Assessment  Discharge Assessment     Demographic Factors:  Caucasian  Mental Status Per Nursing Assessment::   On Admission:     Current Mental Status by Physician: In full contact with reality. Mood euthymic, affect is appropriate. Denies suicidal ideas, plans or intent. Her mood is euthymic, her affect is appropriate. She is insightful as far as what led to the state of mind that resulted in her being admitted. She states she started isolating, was upset with all the weight she had gained, was not staying in touch with her friends, etc. She is going to create a better structure for herself, will be mindful of not isolating has 9 very close friends she can count on. Will start exercising, mother is getting  her a Gym membership down the road from her house., she is already mindful of what she eats, she will decrease the carbohydrates and increase her protein consumption. She has already lost some weight what has been motivating   Loss Factors: NA  Historical Factors: NA  Risk Reduction Factors:   Sense of responsibility to family, Employed, Living with another person, especially a relative and Positive social support  Continued Clinical Symptoms:  Depression:   Comorbid alcohol abuse/dependence Alcohol/Substance Abuse/Dependencies  Cognitive Features That Contribute To Risk:  Polarized thinking Thought constriction (tunnel vision)    Suicide Risk:  Minimal: No identifiable suicidal ideation.  Patients presenting with no risk factors but with morbid ruminations; may be classified as minimal risk based on the severity of the depressive symptoms  Discharge Diagnoses:   AXIS I:  Alcohol Abuse, Anxiety Disorder NOS and Depressive Disorder NOS AXIS II:  Deferred AXIS III:   Past Medical History  Diagnosis Date  . Migraine, unspecified, without mention of intractable migraine without mention of status migrainosus   . Vomiting alone   . Irritable bowel syndrome   .  Esophageal reflux   . Diarrhea   . Panic disorder without agoraphobia   . Hypothyroid   . Depressive disorder, not elsewhere classified   . Depression   . Anxiety    AXIS IV:  other psychosocial or environmental problems AXIS V:  61-70 mild symptoms  Plan Of Care/Follow-up recommendations:  Activity:  as tolerated Diet:  regular Follow up outpatient basis Is patient on multiple antipsychotic therapies at discharge:  No   Has Patient had three or more failed trials of antipsychotic monotherapy by history:  No  Recommended Plan for Multiple Antipsychotic Therapies: NA  Ladrea Holladay A 08/15/2013, 12:21 PM

## 2013-08-15 NOTE — Patient Instructions (Signed)
Please follow up with your psychiatrist and therapist. If you feel any suicidal or homicidal thoughts please call the office or go to the ER.  Suicidal Feelings, How to Help Yourself Everyone feels sad or unhappy at times, but depressing thoughts and feelings of hopelessness can lead to thoughts of suicide. It can seem as if life is too tough to handle. If you feel as though you have reached the point where suicide is the only answer, it is time to let someone know immediately.  HOW TO COPE AND PREVENT SUICIDE  Let family, friends, teachers, or counselors know. Get help. Try not to isolate yourself from those who care about you. Even though you may not feel sociable, talk with someone every day. It is best if it is face-to-face. Remember, they will want to help you.  Eat a regularly spaced and well-balanced diet.  Get plenty of rest.  Avoid alcohol and drugs because they will only make you feel worse and may also lower your inhibitions. Remove them from the home. If you are thinking of taking an overdose of your prescribed medicines, give your medicines to someone who can give them to you one day at a time. If you are on antidepressants, let your caregiver know of your feelings so he or she can provide a safer medicine, if that is a concern.  Remove weapons or poisons from your home.  Try to stick to routines. Follow a schedule and remind yourself that you have to keep that schedule every day.  Set some realistic goals and achieve them. Make a list and cross things off as you go. Accomplishments give a sense of worth. Wait until you are feeling better before doing things you find difficult or unpleasant to do.  If you are able, try to start exercising. Even half-hour periods of exercise each day will make you feel better. Getting out in the sun or into nature helps you recover from depression faster. If you have a favorite place to walk, take advantage of that.  Increase safe activities that  have always given you pleasure. This may include playing your favorite music, reading a good book, painting a picture, or playing your favorite instrument. Do whatever takes your mind off your depression.  Keep your living space well-lighted. GET HELP Contact a suicide hotline, crisis center, or local suicide prevention center for help right away. Local centers may include a hospital, clinic, community service organization, social service provider, or health department.  Call your local emergency services (911 in the [place]).  Call a suicide hotline:  1-800-273-TALK (628-219-0016) in the [place].  1-800-SUICIDE 224-596-2991) in the [place].  202-472-8133 in the [place] for Spanish-speaking counselors.  4-132-440-1UUV (934)007-6982) in the [place] for TTY users.  Visit the following websites for information and help:  National Suicide Prevention Lifeline: www.suicidepreventionlifeline.org  Hopeline: www.hopeline.com  [employer] for Suicide Prevention: https://www.ayers.com/  For lesbian, gay, bisexual, transgender, or questioning youth, contact [employer]:  4-259-5-G-LOVFIE 306-413-2898) in the [place].  www.thetrevorproject.org  In [place], treatment resources are listed in each province with listings available under [employer] for [employer] or similar titles. Another source for Crisis Centres by [place] is located at http://www.suicideprevention.ca/in-crisis-now/find-a-crisis-centre-now/crisis-centres Document Released: 02/22/2003 Document Revised: 11/10/2011 Document Reviewed: 07/13/2007 [facility] Patient Information 2014 [place], [place].

## 2013-08-15 NOTE — [facility] Group Notes (Signed)
[facility] LCSW Aftercare Discharge Planning Group Note   08/15/2013 10:01 AM  Participation Quality:  DID NOT ATTEND-pt meeting with MD during group time.   Smart, [name]  LCSWA 08/15/2013 10:01 AM

## 2013-08-15 NOTE — Progress Notes (Signed)
Pt discharged per MD orders; pt currently denies SI/HI and auditory/visual hallucinations; pt was given education by RN regarding follow-up appointments and medications and pt denied any questions or concerns about these instructions; pt was then escorted to search room to retrieve her belongings by RN before being discharged to hospital lobby. 

## 2013-08-15 NOTE — Progress Notes (Signed)
[facility] Group Notes:  (Nursing/MHT/Case Management/Adjunct)  Date:  08/15/2013  Time:  1:13 PM  Type of Therapy:  Therapeutic Activity  Participation Level:  Active  Participation Quality:  Appropriate and Attentive  Affect:  Appropriate  Cognitive:  Appropriate  Insight:  Appropriate  Engagement in Group:  Engaged  Modes of Intervention:  Activity  Summary of Progress/Problems: Pts played a game of "Unique Qualities. On an index card, pts wrote 5 unique qualities/characteristics about themselves without writing their name on the card or sharing their info. Writer read aloud the characteristics from each card and pts had to try and guess who the characteristics belong to.   Lori Marshall 08/15/2013, 1:13 PM

## 2013-08-15 NOTE — Progress Notes (Signed)
HPI Patient presents for hospital follow up after a suicide attempt. She states she had a migraine for 30 days, then got in a fight with her parents and tried to kill herself by taking Xanax and Atenolol per the records however the patient states she only took the atenolol but not the xanax. She was in the ICU, then spent 3 days in behavioral health. At this time while she was in the hospital she saw a neurologist and he started her on medications for her migraines. She starts to see a therapist tomorrow and is doing group classes for outpatient therapy. She was started on Robaxin, naproxyn, imitrex and compazine which helps her migraines but was not given any medications for out patient. . She currently denies any suicidal or homicidal ideations. She states that her father and mother are functional alcoholics and contributed to her anxiety.  She was also treated for Cdiff in the hospital and has completely her Flagyl but continues to have soft stools.   Past Medical History  Diagnosis Date  . Migraine, unspecified, without mention of intractable migraine without mention of status migrainosus   . Vomiting alone   . Irritable bowel syndrome   . Esophageal reflux   . Diarrhea   . Panic disorder without agoraphobia   . Hypothyroid   . Depressive disorder, not elsewhere classified   . Depression   . Anxiety      Allergies  Allergen Reactions  . Dust Mite Extract Swelling  . Medrol [Methylprednisolone]   . Novocain [Procaine]     unknown  . Procaine Hcl Palpitations      Current Outpatient Prescriptions on File Prior to Visit  Medication Sig Dispense Refill  . atenolol (TENORMIN) 100 MG tablet Take 1 tablet (100 mg total) by mouth at bedtime.      . cetirizine (ZYRTEC) 10 MG tablet Take 1 tablet (10 mg total) by mouth daily.  30 tablet  0  . methocarbamol (ROBAXIN) 500 MG tablet Take 1 tablet (500 mg total) by mouth every 6 (six) hours.  30 tablet  0  . metroNIDAZOLE (FLAGYL) 500 MG  tablet Take 1 tablet (500 mg total) by mouth every 8 (eight) hours.  15 tablet  0  . naproxen (NAPROSYN) 500 MG tablet Take 1 tablet (500 mg total) by mouth 2 (two) times daily with a meal.  30 tablet  0  . pantoprazole (PROTONIX) 40 MG tablet Take 1 tablet (40 mg total) by mouth daily.  30 tablet  0  . SUMAtriptan (IMITREX) 25 MG tablet Take 1 tablet (25 mg total) by mouth every 2 (two) hours as needed for migraine or headache. May repeat in 2 hours if headache persists or recurs.  10 tablet  0   No current facility-administered medications on file prior to visit.    ROS: all negative expect above.   Physical: Filed Weights   08/15/13 1643  Weight: 177 lb (80.287 kg)   Filed Vitals:   08/15/13 1643  BP: 102/70  Pulse: 68  Temp: 97.7 F (36.5 C)  Resp: 16   General Appearance: Well nourished, in no apparent distress. Eyes: PERRLA, EOMs. Sinuses: No Frontal/maxillary tenderness ENT/Mouth: Ext aud canals clear, normal light reflex with TMs without erythema, bulging. Post pharynx without erythema, swelling, exudate.  Respiratory: CTAB Cardio: RRR, no murmurs, rubs or gallops. Peripheral pulses brisk and equal bilaterally, without edema. No aortic or femoral bruits. Abdomen: Flat, soft, with bowl sounds. Nontender, no guarding, rebound. Lymphatics: Non tender without  lymphadenopathy.  Musculoskeletal: Full ROM all peripheral extremities, 5/5 strength, and normal gait. Skin: Warm, dry without rashes, lesions, ecchymosis.  Neuro: Cranial nerves intact, reflexes equal bilaterally. Normal muscle tone, no cerebellar symptoms. Sensation intact.  Pysch: Awake and oriented X 3, normal affect, Poor insight  Assessment and Plan: Anxiety/suicide attempt-  Follow up with therapist and psychiatrist I have explained to the patient that we are unable to fill her xanax and atenolol at this time due to risk of suicide and that it would be better for her if a psychiatrist took over the care.   Migraine-We will give her the medications for migraine Cdiff- continues to have diarrhea but states it has improved- she does not have anymore of the Metronidazole. We will prescribe another 5 day course of this.

## 2013-08-15 NOTE — Progress Notes (Signed)
Adult Psychoeducational Group Note  Date:  08/15/2013 Time:  1:41 PM  Group Topic/Focus:  Self Care:   The focus of this group is to help patients understand the importance of self-care in order to improve or restore emotional, physical, spiritual, interpersonal, and financial health.  Participation Level:  Active  Participation Quality:  Appropriate, Attentive and Sharing  Affect:  Appropriate  Cognitive:  Appropriate  Insight: Appropriate  Engagement in Group:  Engaged and Supportive  Modes of Intervention:  Discussion, Socialization and Support  Additional Comments:  Pt states she needs to improve her scheduling and time management to improve her self-care and take more "me" time without electronics. Pt states some barriers to this are work, and reverting to bad/old habits.  Caswell Corwin 08/15/2013, 1:41 PM

## 2013-08-15 NOTE — Discharge Summary (Signed)
Physician Discharge Summary Note  Patient:  Lori Marshall is an 37 y.o., female MRN:  [id number] DOB:  Aug 24, 1976 Patient phone:  [phone] (home)  Patient address:   [address] [place] [address],   Date of Admission:  08/11/2013 Date of Discharge: 08/15/2013  Reason for Admission:  Benzodiazepine overdose  Discharge Diagnoses: Active Problems:   Anxiety   Depression   Alcohol abuse  Review of Systems  Constitutional: Negative.   HENT: Negative.   Eyes: Negative.   Respiratory: Negative.   Cardiovascular: Negative.   Gastrointestinal: Negative.   Genitourinary: Negative.   Musculoskeletal: Negative.   Skin: Negative.   Neurological: Negative.   Endo/Heme/Allergies: Negative.   Psychiatric/Behavioral: Positive for substance abuse.    DSM5:  Depressive Disorders:  Major Depressive Disorder - Severe (296.23)  Axis Diagnosis:   AXIS I:  Generalized Anxiety Disorder, Panic Disorder, Substance Abuse and Substance Induced Mood Disorder AXIS II:  Deferred AXIS III:   Past Medical History  Diagnosis Date  . Migraine, unspecified, without mention of intractable migraine without mention of status migrainosus   . Vomiting alone   . Irritable bowel syndrome   . Esophageal reflux   . Diarrhea   . Panic disorder without agoraphobia   . Hypothyroid   . Depressive disorder, not elsewhere classified   . Depression   . Anxiety    AXIS IV:  other psychosocial or environmental problems, problems related to social environment and problems with primary support group AXIS V:  61-70 mild symptoms  Level of Care:  OP  Hospital Course:  On admission:  Patient arrives via EMS as an intentional overdose. She states she took roughly 60 Xanax 1 mg tabs and 30 atenolol 50 mg tabs 30-45 minutes prior to arrival. Patient states that the overdose was intentional. She denies any nausea vomiting or abdominal pain. She denies any difficulty breathing. She states that the  episode was prompted by a disagreement with her family. She's been off her Celexa for 3 weeks. Patient was weaned off of Xanax in the hospital. During hospitalization:  Librium PRN utilized successfully for benzodiazepine overdose.  Medications managed--from her home medication list--Atenolol 100 mg at bedtime for tachycardia, Robaxin 500 mg every 6 hours PRN muscle spasms, Protonix 40 mg for GERD, Imitrex PRN headache continued.  Her Zyrtec replaced with Claritin 10 mg daily for allergies.  Flagyl started for c-diff and Trazodone 50 mg for sleep PRN started.  Her Xanax was not continued.  Yurika attended and participated in therapy.  Patient denied suicidal/homicidal ideations and auditory/visual hallucinations, follow-up appointments encouraged to attend, outside support groups encouraged and information given, Rx and 14 day supply of medications.  Laira is mentally and physically stable for discharge.  Consults:  None  Significant Diagnostic Studies:  labs: completed, reviewed, stable  Discharge Vitals:   Blood pressure 118/73, pulse 76, temperature 97.4 F (36.3 C), temperature source Oral, resp. rate 16, height 5' 5.5" (1.664 m), weight 80.74 kg (178 lb). Body mass index is 29.16 kg/(m^2). Lab Results:   No results found for this or any previous visit (from the past 72 hour(s)).  Physical Findings: AIMS: Facial and Oral Movements Muscles of Facial Expression: None, normal Lips and Perioral Area: None, normal Jaw: None, normal Tongue: None, normal,Extremity Movements Upper (arms, wrists, hands, fingers): None, normal Lower (legs, knees, ankles, toes): None, normal, Trunk Movements Neck, shoulders, hips: None, normal, Overall Severity Severity of abnormal movements (highest score from questions above): None, normal Incapacitation due to  abnormal movements: None, normal Patient's awareness of abnormal movements (rate only patient's report): No Awareness, Dental Status Current  problems with teeth and/or dentures?: No Does patient usually wear dentures?: No  CIWA:  CIWA-Ar Total: 4 COWS:     Psychiatric Specialty Exam: See Psychiatric Specialty Exam and Suicide Risk Assessment completed by Attending Physician prior to discharge.  Discharge destination:  Home  Is patient on multiple antipsychotic therapies at discharge:  No   Has Patient had three or more failed trials of antipsychotic monotherapy by history:  No  Recommended Plan for Multiple Antipsychotic Therapies: NA  Discharge Orders   Future Orders Complete By Expires   Activity as tolerated - No restrictions  As directed    Diet - low sodium heart healthy  As directed        Medication List    STOP taking these medications       ALPRAZolam 1 MG tablet  Commonly known as:  XANAX     ondansetron 4 MG disintegrating tablet  Commonly known as:  ZOFRAN ODT      TAKE these medications     Indication   atenolol 100 MG tablet  Commonly known as:  TENORMIN  Take 1 tablet (100 mg total) by mouth at bedtime.   Indication:  Ventricular Tachycardia     cetirizine 10 MG tablet  Commonly known as:  ZYRTEC  Take 1 tablet (10 mg total) by mouth daily.   Indication:  Hayfever     hydrOXYzine 25 MG tablet  Commonly known as:  ATARAX/VISTARIL  Take 1 tablet (25 mg total) by mouth every 6 (six) hours as needed for anxiety.      methocarbamol 500 MG tablet  Commonly known as:  ROBAXIN  Take 1 tablet (500 mg total) by mouth every 6 (six) hours.   Indication:  Musculoskeletal Pain     metroNIDAZOLE 500 MG tablet  Commonly known as:  FLAGYL  Take 1 tablet (500 mg total) by mouth every 8 (eight) hours.   Indication:  Clostridium Difficile Associated Diarrhea     naproxen 500 MG tablet  Commonly known as:  NAPROSYN  Take 1 tablet (500 mg total) by mouth 2 (two) times daily with a meal.   Indication:  Mild to Moderate Pain     pantoprazole 40 MG tablet  Commonly known as:  PROTONIX  Take 1  tablet (40 mg total) by mouth daily.      SUMAtriptan 25 MG tablet  Commonly known as:  IMITREX  Take 1 tablet (25 mg total) by mouth every 2 (two) hours as needed for migraine or headache. May repeat in 2 hours if headache persists or recurs.      traZODone 50 MG tablet  Commonly known as:  DESYREL  Take 1 tablet (50 mg total) by mouth at bedtime as needed and may repeat dose one time if needed for sleep.   Indication:  Trouble Sleeping           Follow-up Information   Follow up with [facility]. (Go to the walk-in clinic M-F between 8 and9AM for your hospital follow up appointment)    Contact information:   [address]  [place] [336] [phone]       Follow up with Also wants to see a therapist.      Follow-up recommendations:  Activity:  as tolerated Diet:  low-sodium heart healthy diet Continue to work your relapse prevention plan and on the life style changes that  could help you better manage your anxiety, mood disorders  Comments:  Patient will continue her care at [facility].  Total Discharge Time:  Greater than 30 minutes.  SignedNanine Means, PMH-NP 08/15/2013, 9:53 AM Agree with assessment and plan Reymundo Poll. Dub Mikes, M.D.

## 2013-08-18 NOTE — Progress Notes (Signed)
Patient Discharge Instructions:  After Visit Summary (AVS):   Faxed to:  08/18/13 Discharge Summary Note:   Faxed to:  08/18/13 Psychiatric Admission Assessment Note:   Faxed to:  08/18/13 Suicide Risk Assessment - Discharge Assessment:   Faxed to:  08/18/13 Faxed/Sent to the Next Level Care provider:  08/18/13 Faxed to [facility] @ [phone] Faxed to [facility] @ [phone]  Jerelene Redden, 08/18/2013, 3:44 PM

## 2013-09-05 ENCOUNTER — Other Ambulatory Visit: Payer: Self-pay | Admitting: Physician Assistant

## 2013-09-05 MED ORDER — HYDROXYZINE HCL 25 MG PO TABS: 25.0000 mg | ORAL_TABLET | Freq: Three times a day (TID) | ORAL | Status: DC | PRN

## 2013-09-05 MED ORDER — TRAZODONE HCL 50 MG PO TABS
50.0000 mg | ORAL_TABLET | Freq: Every day | ORAL | Status: DC
Start: 2013-09-05 — End: 2014-12-11

## 2013-11-04 ENCOUNTER — Ambulatory Visit: Payer: Self-pay | Admitting: Physician Assistant

## 2013-11-14 ENCOUNTER — Ambulatory Visit: Payer: Self-pay | Admitting: Physician Assistant

## 2013-11-16 ENCOUNTER — Encounter: Payer: Self-pay | Admitting: Physician Assistant

## 2013-11-16 ENCOUNTER — Ambulatory Visit (INDEPENDENT_AMBULATORY_CARE_PROVIDER_SITE_OTHER): Payer: Self-pay | Admitting: Physician Assistant

## 2013-11-16 VITALS — BP 100/60 | HR 88 | Temp 98.1°F | Resp 16 | Wt 177.0 lb

## 2013-11-16 DIAGNOSIS — F419 Anxiety disorder, unspecified: Secondary | ICD-10-CM

## 2013-11-16 DIAGNOSIS — F411 Generalized anxiety disorder: Secondary | ICD-10-CM

## 2013-11-16 DIAGNOSIS — M549 Dorsalgia, unspecified: Secondary | ICD-10-CM

## 2013-11-16 MED ORDER — HYDROXYZINE HCL 50 MG PO TABS: 25.0000 mg | ORAL_TABLET | Freq: Three times a day (TID) | ORAL | Status: DC | PRN

## 2013-11-16 NOTE — Progress Notes (Signed)
   Subjective:    Patient ID: Lori Marshall, female    DOB: 1976/02/18, 38 y.o.   MRN: [id number]  HPI 38 y.o. female with long history of anxiety presents for follow up. Takes trazadone at night for sleep occ, has seen neurologist and has gotten botox injections which has helped. She complains of left SI pain, some radiation down her leg. She had a history of getting an injection in it before and does better without prednsione. [facility]     Review of Systems  Constitutional: Negative.   HENT: Negative.   Respiratory: Negative.   Cardiovascular: Negative.   Gastrointestinal: Negative.   Genitourinary: Negative.   Musculoskeletal: Positive for back pain. Negative for arthralgias, gait problem, joint swelling, myalgias and neck pain.  Neurological: Negative.   Psychiatric/Behavioral: Positive for sleep disturbance. Negative for suicidal ideas. The patient is nervous/anxious.        Objective:   Physical Exam  Constitutional: She appears well-developed and well-nourished.  HENT:  Head: Normocephalic and atraumatic.  Eyes: Conjunctivae are normal. Pupils are equal, round, and reactive to light.  Neck: Normal range of motion. Neck supple.  Cardiovascular: Normal rate and regular rhythm.   Pulmonary/Chest: Effort normal and breath sounds normal.  Abdominal: Soft. Bowel sounds are normal.  Musculoskeletal: Normal range of motion.  General Appearance:  No distress.  Patient is able to ambulate well.  Gait is not antalgic. Straight leg raising negative bilaterally for radicular symptoms. Sensory exam in the legs is normal.  Knee reflexes are normal and symmetric. Ankle reflexes are normal and symmetric Strength is normal and symmetric. There is notparaspinal muscle spasm.  There is not no midline tenderness.  ROM of spine with normal flexion, extension, lateral range of motion to the right and left, and rotation to the right and left. + left  SI pain       Assessment & Plan:  Anxiety- can try 50mg  of the vistaril, and talk with her counselor about a psychiatrist, not willing to give xanax or controlled substances with history of suicide attempt Back pain- has had an injection in the past and with her anxiety I do not want to do prednisone injection.

## 2013-11-16 NOTE — Patient Instructions (Signed)
Sciatica with Rehab The sciatic nerve runs from the back down the leg and is responsible for sensation and control of the muscles in the back (posterior) side of the thigh, lower leg, and foot. Sciatica is a condition that is characterized by inflammation of this nerve.  SYMPTOMS   Signs of nerve damage, including numbness and/or weakness along the posterior side of the lower extremity.  Pain in the back of the thigh that may also travel down the leg.  Pain that worsens when sitting for long periods of time.  Occasionally, pain in the back or buttock. CAUSES  Inflammation of the sciatic nerve is the cause of sciatica. The inflammation is due to something irritating the nerve. Common sources of irritation include:  Sitting for long periods of time.  Direct trauma to the nerve.  Arthritis of the spine.  Herniated or ruptured disk.  Slipping of the vertebrae (spondylolithesis)  Pressure from soft tissues, such as muscles or ligament-like tissue (fascia). RISK INCREASES WITH:  Sports that place pressure or stress on the spine (football or weightlifting).  Poor strength and flexibility.  Failure to warm-up properly before activity.  Family history of low back pain or disk disorders.  Previous back injury or surgery.  Poor body mechanics, especially when lifting, or poor posture. PREVENTION   Warm up and stretch properly before activity.  Maintain physical fitness:  Strength, flexibility, and endurance.  Cardiovascular fitness.  Learn and use proper technique, especially with posture and lifting. When possible, have coach correct improper technique.  Avoid activities that place stress on the spine. PROGNOSIS If treated properly, then sciatica usually resolves within 6 weeks. However, occasionally surgery is necessary.  RELATED COMPLICATIONS   Permanent nerve damage, including pain, numbness, tingle, or weakness.  Chronic back pain.  Risks of surgery: infection,  bleeding, nerve damage, or damage to surrounding tissues. TREATMENT Treatment initially involves resting from any activities that aggravate your symptoms. The use of ice and medication may help reduce pain and inflammation. The use of strengthening and stretching exercises may help reduce pain with activity. These exercises may be performed at home or with referral to a therapist. A therapist may recommend further treatments, such as transcutaneous electronic nerve stimulation (TENS) or ultrasound. Your caregiver may recommend corticosteroid injections to help reduce inflammation of the sciatic nerve. If symptoms persist despite non-surgical (conservative) treatment, then surgery may be recommended. MEDICATION  If pain medication is necessary, then nonsteroidal anti-inflammatory medications, such as aspirin and ibuprofen, or other minor pain relievers, such as acetaminophen, are often recommended.  Do not take pain medication for 7 days before surgery.  Prescription pain relievers may be given if deemed necessary by your caregiver. Use only as directed and only as much as you need.  Ointments applied to the skin may be helpful.  Corticosteroid injections may be given by your caregiver. These injections should be reserved for the most serious cases, because they may only be given a certain number of times. HEAT AND COLD  Cold treatment (icing) relieves pain and reduces inflammation. Cold treatment should be applied for 10 to 15 minutes every 2 to 3 hours for inflammation and pain and immediately after any activity that aggravates your symptoms. Use ice packs or massage the area with a piece of ice (ice massage).  Heat treatment may be used prior to performing the stretching and strengthening activities prescribed by your caregiver, physical therapist, or athletic trainer. Use a heat pack or soak the injury in warm water. SEEK   MEDICAL CARE IF:  Treatment seems to offer no benefit, or the condition  worsens.  Any medications produce adverse side effects. EXERCISES  RANGE OF MOTION (ROM) AND STRETCHING EXERCISES - Sciatica Most people with sciatic will find that their symptoms worsen with either excessive bending forward (flexion) or arching at the low back (extension). The exercises which will help resolve your symptoms will focus on the opposite motion. Your physician, physical therapist or athletic trainer will help you determine which exercises will be most helpful to resolve your low back pain. Do not complete any exercises without first consulting with your clinician. Discontinue any exercises which worsen your symptoms until you speak to your clinician. If you have pain, numbness or tingling which travels down into your buttocks, leg or foot, the goal of the therapy is for these symptoms to move closer to your back and eventually resolve. Occasionally, these leg symptoms will get better, but your low back pain may worsen; this is typically an indication of progress in your rehabilitation. Be certain to be very alert to any changes in your symptoms and the activities in which you participated in the 24 hours prior to the change. Sharing this information with your clinician will allow him/her to most efficiently treat your condition. These exercises may help you when beginning to rehabilitate your injury. Your symptoms may resolve with or without further involvement from your physician, physical therapist or athletic trainer. While completing these exercises, remember:   Restoring tissue flexibility helps normal motion to return to the joints. This allows healthier, less painful movement and activity.  An effective stretch should be held for at least 30 seconds.  A stretch should never be painful. You should only feel a gentle lengthening or release in the stretched tissue. FLEXION RANGE OF MOTION AND STRETCHING EXERCISES: STRETCH  Flexion, Single Knee to Chest   Lie on a firm bed or floor  with both legs extended in front of you.  Keeping one leg in contact with the floor, bring your opposite knee to your chest. Hold your leg in place by either grabbing behind your thigh or at your knee.  Pull until you feel a gentle stretch in your low back. Hold __________ seconds.  Slowly release your grasp and repeat the exercise with the opposite side. Repeat __________ times. Complete this exercise __________ times per day.  STRETCH  Flexion, Double Knee to Chest  Lie on a firm bed or floor with both legs extended in front of you.  Keeping one leg in contact with the floor, bring your opposite knee to your chest.  Tense your stomach muscles to support your back and then lift your other knee to your chest. Hold your legs in place by either grabbing behind your thighs or at your knees.  Pull both knees toward your chest until you feel a gentle stretch in your low back. Hold __________ seconds.  Tense your stomach muscles and slowly return one leg at a time to the floor. Repeat __________ times. Complete this exercise __________ times per day.  STRETCH  Low Trunk Rotation   Lie on a firm bed or floor. Keeping your legs in front of you, bend your knees so they are both pointed toward the ceiling and your feet are flat on the floor.  Extend your arms out to the side. This will stabilize your upper body by keeping your shoulders in contact with the floor.  Gently and slowly drop both knees together to one side until you   feel a gentle stretch in your low back. Hold for __________ seconds.  Tense your stomach muscles to support your low back as you bring your knees back to the starting position. Repeat the exercise to the other side. Repeat __________ times. Complete this exercise __________ times per day  EXTENSION RANGE OF MOTION AND FLEXIBILITY EXERCISES: STRETCH  Extension, Prone on Elbows  Lie on your stomach on the floor, a bed will be too soft. Place your palms about shoulder  width apart and at the height of your head.  Place your elbows under your shoulders. If this is too painful, stack pillows under your chest.  Allow your body to relax so that your hips drop lower and make contact more completely with the floor.  Hold this position for __________ seconds.  Slowly return to lying flat on the floor. Repeat __________ times. Complete this exercise __________ times per day.  RANGE OF MOTION  Extension, Prone Press Ups  Lie on your stomach on the floor, a bed will be too soft. Place your palms about shoulder width apart and at the height of your head.  Keeping your back as relaxed as possible, slowly straighten your elbows while keeping your hips on the floor. You may adjust the placement of your hands to maximize your comfort. As you gain motion, your hands will come more underneath your shoulders.  Hold this position __________ seconds.  Slowly return to lying flat on the floor. Repeat __________ times. Complete this exercise __________ times per day.  STRENGTHENING EXERCISES - Sciatica  These exercises may help you when beginning to rehabilitate your injury. These exercises should be done near your "sweet spot." This is the neutral, low-back arch, somewhere between fully rounded and fully arched, that is your least painful position. When performed in this safe range of motion, these exercises can be used for people who have either a flexion or extension based injury. These exercises may resolve your symptoms with or without further involvement from your physician, physical therapist or athletic trainer. While completing these exercises, remember:   Muscles can gain both the endurance and the strength needed for everyday activities through controlled exercises.  Complete these exercises as instructed by your physician, physical therapist or athletic trainer. Progress with the resistance and repetition exercises only as your caregiver advises.  You may  experience muscle soreness or fatigue, but the pain or discomfort you are trying to eliminate should never worsen during these exercises. If this pain does worsen, stop and make certain you are following the directions exactly. If the pain is still present after adjustments, discontinue the exercise until you can discuss the trouble with your clinician. STRENGTHENING Deep Abdominals, Pelvic Tilt   Lie on a firm bed or floor. Keeping your legs in front of you, bend your knees so they are both pointed toward the ceiling and your feet are flat on the floor.  Tense your lower abdominal muscles to press your low back into the floor. This motion will rotate your pelvis so that your tail bone is scooping upwards rather than pointing at your feet or into the floor.  With a gentle tension and even breathing, hold this position for __________ seconds. Repeat __________ times. Complete this exercise __________ times per day.  STRENGTHENING  Abdominals, Crunches   Lie on a firm bed or floor. Keeping your legs in front of you, bend your knees so they are both pointed toward the ceiling and your feet are flat on the floor. Cross   your arms over your chest.  Slightly tip your chin down without bending your neck.  Tense your abdominals and slowly lift your trunk high enough to just clear your shoulder blades. Lifting higher can put excessive stress on the low back and does not further strengthen your abdominal muscles.  Control your return to the starting position. Repeat __________ times. Complete this exercise __________ times per day.  STRENGTHENING  Quadruped, Opposite UE/LE Lift  Assume a hands and knees position on a firm surface. Keep your hands under your shoulders and your knees under your hips. You may place padding under your knees for comfort.  Find your neutral spine and gently tense your abdominal muscles so that you can maintain this position. Your shoulders and hips should form a rectangle that  is parallel with the floor and is not twisted.  Keeping your trunk steady, lift your right hand no higher than your shoulder and then your left leg no higher than your hip. Make sure you are not holding your breath. Hold this position __________ seconds.  Continuing to keep your abdominal muscles tense and your back steady, slowly return to your starting position. Repeat with the opposite arm and leg. Repeat __________ times. Complete this exercise __________ times per day.  STRENGTHENING  Abdominals and Quadriceps, Straight Leg Raise   Lie on a firm bed or floor with both legs extended in front of you.  Keeping one leg in contact with the floor, bend the other knee so that your foot can rest flat on the floor.  Find your neutral spine, and tense your abdominal muscles to maintain your spinal position throughout the exercise.  Slowly lift your straight leg off the floor about 6 inches for a count of 15, making sure to not hold your breath.  Still keeping your neutral spine, slowly lower your leg all the way to the floor. Repeat this exercise with each leg __________ times. Complete this exercise __________ times per day. POSTURE AND BODY MECHANICS CONSIDERATIONS - Sciatica Keeping correct posture when sitting, standing or completing your activities will reduce the stress put on different body tissues, allowing injured tissues a chance to heal and limiting painful experiences. The following are general guidelines for improved posture. Your physician or physical therapist will provide you with any instructions specific to your needs. While reading these guidelines, remember:  The exercises prescribed by your provider will help you have the flexibility and strength to maintain correct postures.  The correct posture provides the optimal environment for your joints to work. All of your joints have less wear and tear when properly supported by a spine with good posture. This means you will  experience a healthier, less painful body.  Correct posture must be practiced with all of your activities, especially prolonged sitting and standing. Correct posture is as important when doing repetitive low-stress activities (typing) as it is when doing a single heavy-load activity (lifting). RESTING POSITIONS Consider which positions are most painful for you when choosing a resting position. If you have pain with flexion-based activities (sitting, bending, stooping, squatting), choose a position that allows you to rest in a less flexed posture. You would want to avoid curling into a fetal position on your side. If your pain worsens with extension-based activities (prolonged standing, working overhead), avoid resting in an extended position such as sleeping on your stomach. Most people will find more comfort when they rest with their spine in a more neutral position, neither too rounded nor too arched. Lying   on a non-sagging bed on your side with a pillow between your knees, or on your back with a pillow under your knees will often provide some relief. Keep in mind, being in any one position for a prolonged period of time, no matter how correct your posture, can still lead to stiffness. PROPER SITTING POSTURE In order to minimize stress and discomfort on your spine, you must sit with correct posture Sitting with good posture should be effortless for a healthy body. Returning to good posture is a gradual process. Many people can work toward this most comfortably by using various supports until they have the flexibility and strength to maintain this posture on their own. When sitting with proper posture, your ears will fall over your shoulders and your shoulders will fall over your hips. You should use the back of the chair to support your upper back. Your low back will be in a neutral position, just slightly arched. You may place a small pillow or folded towel at the base of your low back for support.  When  working at a desk, create an environment that supports good, upright posture. Without extra support, muscles fatigue and lead to excessive strain on joints and other tissues. Keep these recommendations in mind: CHAIR:   A chair should be able to slide under your desk when your back makes contact with the back of the chair. This allows you to work closely.  The chair's height should allow your eyes to be level with the upper part of your monitor and your hands to be slightly lower than your elbows. BODY POSITION  Your feet should make contact with the floor. If this is not possible, use a foot rest.  Keep your ears over your shoulders. This will reduce stress on your neck and low back. INCORRECT SITTING POSTURES   If you are feeling tired and unable to assume a healthy sitting posture, do not slouch or slump. This puts excessive strain on your back tissues, causing more damage and pain. Healthier options include:  Using more support, like a lumbar pillow.  Switching tasks to something that requires you to be upright or walking.  Talking a brief walk.  Lying down to rest in a neutral-spine position. PROLONGED STANDING WHILE SLIGHTLY LEANING FORWARD  When completing a task that requires you to lean forward while standing in one place for a long time, place either foot up on a stationary 2-4 inch high object to help maintain the best posture. When both feet are on the ground, the low back tends to lose its slight inward curve. If this curve flattens (or becomes too large), then the back and your other joints will experience too much stress, fatigue more quickly and can cause pain.  CORRECT STANDING POSTURES Proper standing posture should be assumed with all daily activities, even if they only take a few moments, like when brushing your teeth. As in sitting, your ears should fall over your shoulders and your shoulders should fall over your hips. You should keep a slight tension in your abdominal  muscles to brace your spine. Your tailbone should point down to the ground, not behind your body, resulting in an over-extended swayback posture.  INCORRECT STANDING POSTURES  Common incorrect standing postures include a forward head, locked knees and/or an excessive swayback. WALKING Walk with an upright posture. Your ears, shoulders and hips should all line-up. PROLONGED ACTIVITY IN A FLEXED POSITION When completing a task that requires you to bend forward at your   waist or lean over a low surface, try to find a way to stabilize 3 of 4 of your limbs. You can place a hand or elbow on your thigh or rest a knee on the surface you are reaching across. This will provide you more stability so that your muscles do not fatigue as quickly. By keeping your knees relaxed, or slightly bent, you will also reduce stress across your low back. CORRECT LIFTING TECHNIQUES DO :   Assume a wide stance. This will provide you more stability and the opportunity to get as close as possible to the object which you are lifting.  Tense your abdominals to brace your spine; then bend at the knees and hips. Keeping your back locked in a neutral-spine position, lift using your leg muscles. Lift with your legs, keeping your back straight.  Test the weight of unknown objects before attempting to lift them.  Try to keep your elbows locked down at your sides in order get the best strength from your shoulders when carrying an object.  Always ask for help when lifting heavy or awkward objects. INCORRECT LIFTING TECHNIQUES DO NOT:   Lock your knees when lifting, even if it is a small object.  Bend and twist. Pivot at your feet or move your feet when needing to change directions.  Assume that you cannot safely pick up a paperclip without proper posture. Document Released: 08/18/2005 Document Revised: 11/10/2011 Document Reviewed: 11/30/2008 [facility] [place] Patient Information 2014 [place], [place]. Generalized Anxiety  Disorder Generalized anxiety disorder (GAD) is a mental disorder. It interferes with life functions, including relationships, work, and school. GAD is different from normal anxiety, which everyone experiences at some point in their lives in response to specific life events and activities. Normal anxiety actually helps [place] prepare for and get through these life events and activities. Normal anxiety goes away after the event or activity is over.  GAD causes anxiety that is not necessarily related to specific events or activities. It also causes excess anxiety in proportion to specific events or activities. The anxiety associated with GAD is also difficult to control. GAD can vary from mild to severe. People with severe GAD can have intense waves of anxiety with physical symptoms (panic attacks).  SYMPTOMS The anxiety and worry associated with GAD are difficult to control. This anxiety and worry are related to many life events and activities and also occur more days than not for 6 months or longer. People with GAD also have three or more of the following symptoms (one or more in children):  Restlessness.   Fatigue.  Difficulty concentrating.   Irritability.  Muscle tension.  Difficulty sleeping or unsatisfying sleep. DIAGNOSIS GAD is diagnosed through an assessment by your caregiver. Your caregiver will ask you questions aboutyour mood,physical symptoms, and events in your life. Your caregiver may ask you about your medical history and use of alcohol or drugs, including prescription medications. Your caregiver may also do a physical exam and blood tests. Certain medical conditions and the use of certain substances can cause symptoms similar to those associated with GAD. Your caregiver may refer you to a mental health specialist for further evaluation. TREATMENT The following therapies are usually used to treat GAD:   Medication Antidepressant medication usually is prescribed for long-term  daily control. Antianxiety medications may be added in severe cases, especially when panic attacks occur.   Talk therapy (psychotherapy) Certain types of talk therapy can be helpful in treating GAD by providing support, education, and guidance. A form  of talk therapy called cognitive behavioral therapy can teach you healthy ways to think about and react to daily life events and activities.  Stress managementtechniques These include yoga, meditation, and exercise and can be very helpful when they are practiced regularly. A mental health specialist can help determine which treatment is best for you. Some people see improvement with one therapy. However, other people require a combination of therapies. Document Released: 12/13/2012 Document Reviewed: 12/13/2012 [facility] Patient Information 2014 [place].

## 2014-03-02 ENCOUNTER — Other Ambulatory Visit: Payer: Self-pay | Admitting: Physician Assistant

## 2014-03-02 MED ORDER — ACYCLOVIR 800 MG PO TABS: 800.0000 mg | ORAL_TABLET | Freq: Three times a day (TID) | ORAL | Status: DC

## 2014-05-17 ENCOUNTER — Ambulatory Visit: Payer: Self-pay | Admitting: Internal Medicine

## 2014-06-16 ENCOUNTER — Other Ambulatory Visit: Payer: Self-pay

## 2014-07-03 ENCOUNTER — Encounter: Payer: Self-pay | Admitting: Physician Assistant

## 2014-07-22 ENCOUNTER — Encounter (HOSPITAL_COMMUNITY): Payer: Self-pay

## 2014-07-22 ENCOUNTER — Emergency Department (INDEPENDENT_AMBULATORY_CARE_PROVIDER_SITE_OTHER)
Admission: EM | Admit: 2014-07-22 | Discharge: 2014-07-22 | Disposition: A | Discharge status: Home / Self Care | Payer: Self-pay | Source: Home / Self Care | Attending: Emergency Medicine | Admitting: Emergency Medicine

## 2014-07-22 ENCOUNTER — Ambulatory Visit (HOSPITAL_COMMUNITY): Payer: Medicaid Other | Attending: Emergency Medicine

## 2014-07-22 DIAGNOSIS — M25551 Pain in right hip: Secondary | ICD-10-CM | POA: Insufficient documentation

## 2014-07-22 DIAGNOSIS — M25559 Pain in unspecified hip: Secondary | ICD-10-CM

## 2014-07-22 DIAGNOSIS — M6798 Unspecified disorder of synovium and tendon, other site: Secondary | ICD-10-CM

## 2014-07-22 DIAGNOSIS — M67951 Unspecified disorder of synovium and tendon, right thigh: Secondary | ICD-10-CM

## 2014-07-22 MED ORDER — METHYLPREDNISOLONE ACETATE 80 MG/ML IJ SUSP
INTRAMUSCULAR | Status: AC
  Filled 2014-07-22: qty 1

## 2014-07-22 MED ORDER — HYDROCODONE-ACETAMINOPHEN 5-325 MG PO TABS
2.0000 | ORAL_TABLET | Freq: Once | ORAL | Status: AC
  Administered 2014-07-22: 2 via ORAL

## 2014-07-22 MED ORDER — PENTAFLUOROPROP-TETRAFLUOROETH EX AERO
INHALATION_SPRAY | CUTANEOUS | Status: AC
  Filled 2014-07-22: qty 103.5

## 2014-07-22 MED ORDER — DICLOFENAC SODIUM 75 MG PO TBEC: 75.0000 mg | DELAYED_RELEASE_TABLET | Freq: Two times a day (BID) | ORAL | Status: DC

## 2014-07-22 MED ORDER — LIDOCAINE HCL 2 % IJ SOLN
INTRAMUSCULAR | Status: AC
  Filled 2014-07-22: qty 20

## 2014-07-22 MED ORDER — HYDROCODONE-ACETAMINOPHEN 5-325 MG PO TABS
ORAL_TABLET | ORAL | Status: AC
  Filled 2014-07-22: qty 2

## 2014-07-22 MED ORDER — HYDROCODONE-ACETAMINOPHEN 5-325 MG PO TABS: ORAL_TABLET | ORAL | Status: DC

## 2014-07-22 NOTE — ED Provider Notes (Signed)
Chief Complaint    Hip Pain   History of Present Illness     Lori Marshall is a 38 year old female who's had a one half week history of pain in the right hip. The pain is localized in the buttock area without radiation. The patient states she is on her feet all day long, lifting heavy packages. It's worse at work and better with rest. It is not radiated down her leg and there is no numbness, tingling, or weakness. She denies any saddle anesthesia or bladder bowel dysfunction. She's had no lower back or abdominal pain. No fever or chills.  Review of Systems     Other than as noted above, the patient denies any of the following symptoms: Systemic:  No fevers or chills.   Musculoskeletal:  No arthritis, back pain, or neck pain. Neurological:  No muscular weakness or paresthesias.  PMFSH    Past medical history, family history, social history, meds, and allergies were reviewed.    Physical Exam    Vital signs:  BP 112/80 mmHg  Pulse 85  Temp(Src) 98.4 F (36.9 C) (Oral)  Resp 16  SpO2 98%  LMP 07/20/2014 Gen:  Alert and oriented times 3.  In no distress. Musculoskeletal: There is no swelling, erythema, heat, bruising, or deformity. There is pain to palpation in the right buttock area. On logrolling, there is no pain. She has mild pain in flexion to 90 and more pain to flex beyond that. Straight leg raising is negative. She has mild pain on Faber maneuver and more serious pain on Fadir maneuver.  Otherwise, all joints had a full a ROM with no swelling, bruising or deformity.  No edema, pulses full. Extremities were warm and pink.  Capillary refill was brisk.  Skin:  Clear, warm and dry.  No rash. Neuro:  Alert and oriented times 3.  Muscle strength was normal.  Sensation was intact to light touch.   Radiology     Dg Hip Complete Right  07/22/2014   CLINICAL DATA:  [id number] year old female with right hip injury and pain.  EXAM: RIGHT HIP - COMPLETE 2+ VIEW  COMPARISON:  None.   FINDINGS: There is no evidence of hip fracture or dislocation. There is no evidence of arthropathy or other focal bone abnormality.  IMPRESSION: Negative.   Electronically Signed   By: Laveda AbbeJeff  Hu M.D.   On: 07/22/2014 14:07    I reviewed the images independently and personally and concur with the radiologist's findings.   Course in [facility]   The following medications were given:  Medications  HYDROcodone-acetaminophen (NORCO/VICODIN) 5-325 MG per tablet 2 tablet (2 tablets Oral Given 07/22/14 1321)    Procedure Note:  Verbal informed consent was obtained from the patient.  Risks and benefits were outlined with the patient.  Patient understands and accepts these risks. A time out was called and the name of the procedure, the procedure site, and identity of the patient were confirmed verbally and by wristband.    The procedure was then performed as follows:  The point of maximal tenderness in the right buttock was identified. The area was prepped with Betadine and alcohol and anesthetized with ethyl chloride spray. Using 1-1/2 inch 27-gauge needle, 1 mL of Depo-Medrol 80 mg and 2 mL of 2% Xylocaine were injected.  The patient tolerated the procedure well without any immediate complications.   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 A diagnosis of Hip pain was  also pertinent to this visit.  Plan   1.  Meds:  The following meds were prescribed:   Discharge Medication List as of 07/22/2014  2:49 PM    START taking these medications   Details  diclofenac (VOLTAREN) 75 MG EC tablet Take 1 tablet (75 mg total) by mouth 2 (two) times daily., Starting 07/22/2014, Until Discontinued, Normal    HYDROcodone-acetaminophen (NORCO/VICODIN) 5-325 MG per tablet 1 to 2 tabs every 4 to 6 hours as needed for pain., Print        2.  Patient Education/Counseling:  The patient was given appropriate handouts, self care instructions, and instructed  in pain control, rest and activity, elevation, application of ice and compression.  The patient was given exercises start doing. Follow-up with orthopedics if no better in 2 weeks.  3.  Follow up:  The patient was told to follow up here if no better in 3 to 4 days, or sooner if becoming worse in any way, and given some red flag symptoms such as worsening pain or new neurological symptoms which would prompt immediate return.     Reuben Likesavid C Jamear Carbonneau, MD 07/22/14 2029

## 2014-07-22 NOTE — Discharge Instructions (Signed)
Most hip pain is caused by osteoarthritis, bursitis, or tendonitis.  Simple measures plus regular gentle exercises can help. ° °Do not do the following: °· Avoid squatting and doing deep knee bends.  This puts too much of load on your cartiledges and tendons.  If you do a knee bend, go only half way down, flexing your knee no more than 90 degrees. °· Avoid sleeping on the side that hurts. ° °Do the following: °· If you are overweight or obese, lose weight.  This makes for a lot less load on your hip joints. °· If you use tobacco, quit.  Nicotine causes spasm of the small arteries, decreases blood flow, and impairs your body's normal ability to repair damage. °· If your hip is acutely inflamed, use the principles of RICE (rest, ice, compression, and elevation). °· Use of over the counter pain meds can be of help.  Tylenol (or acetaminophen) is the safest to use.  It often helps to take this regularly.  You can take up to 2 325 mg tablets 5 times daily, but it best to start out much lower that that, perhaps 2 325 mg tablets twice daily, then increase from there. People who are on the blood thinner warfarin have to be careful about taking high doses of Tylenol.  For people who are able to tolerate them, ibuprofen and naproxyn can also help with the pain.  You should discuss these agents with your physician before taking them.  People with chronic kidney disease, hypertension, peptic ulcer disease, and reflux can suffer adverse side effects. They should not be taken with warfarin. The maximum dosage of ibuprofen is 800 mg 3 times daily with meals.  The maximum dosage of naprosyn is 2 and 1/2 tablets twice daily with food, but again, start out low and gradually increase the dose until adequate pain relief is achieved. Ibuprofen and naprosyn should always be taken with food. °· People with cartiledge injury or osteoarthritis may find glucosamine to be helpful.  This is an over-the-counter supplement that helps nourish and  repair cartiledge.  The dose is 500 mg 3 times daily or 1500 mg taken in a single dose. This can take several months to work and it doesn't always work.   °· For people with hip pain on just one side, use of a cane held in the hand on the same side as the hip pain takes some of the stress off the hip joint and can make a big difference in hip pain. °· Wearing good shoes with adequate arch support is essential. Use of an orthotic insert can be very helpful.  These can be purchased at a shoe store or inexpensive inserts can be gotten at the drug store. °· Regular exercise is of utmost importance.  Swimming, water aerobics, low impact aerobics, yoga or tai chi are helpful.  Use of an elliptical exerciser put the least stress on the hips of any type of exercise machine. °· Finally doing the exercises below can be very helpful. Try to do them twice a day followed by ice for 10 minutes. ° ° ° ° ° ° ° ° ° °

## 2014-07-22 NOTE — ED Notes (Signed)
Pain in right hip since 11-14, no specific injury event.

## 2014-07-25 ENCOUNTER — Other Ambulatory Visit: Payer: Self-pay | Admitting: *Deleted

## 2014-07-25 MED ORDER — PROMETHAZINE HCL 25 MG PO TABS: 25.0000 mg | ORAL_TABLET | Freq: Four times a day (QID) | ORAL | Status: DC | PRN

## 2014-11-07 ENCOUNTER — Other Ambulatory Visit: Payer: Self-pay | Admitting: Physician Assistant

## 2014-12-11 ENCOUNTER — Encounter: Payer: Self-pay | Admitting: Internal Medicine

## 2014-12-11 ENCOUNTER — Ambulatory Visit (INDEPENDENT_AMBULATORY_CARE_PROVIDER_SITE_OTHER): Payer: Self-pay | Admitting: Internal Medicine

## 2014-12-11 VITALS — BP 110/80 | HR 94 | Temp 98.2°F | Resp 16 | Ht 66.0 in | Wt 157.0 lb

## 2014-12-11 DIAGNOSIS — R112 Nausea with vomiting, unspecified: Secondary | ICD-10-CM

## 2014-12-11 DIAGNOSIS — B001 Herpesviral vesicular dermatitis: Secondary | ICD-10-CM

## 2014-12-11 MED ORDER — ACYCLOVIR 800 MG PO TABS: 800.0000 mg | ORAL_TABLET | Freq: Three times a day (TID) | ORAL | Status: DC

## 2014-12-11 MED ORDER — PROMETHAZINE HCL 25 MG PO TABS: 25.0000 mg | ORAL_TABLET | Freq: Four times a day (QID) | ORAL | Status: DC | PRN

## 2014-12-11 NOTE — Patient Instructions (Signed)
Herpes Labialis  You have a fever blister or cold sore (herpes labialis). These painful, grouped sores are caused by one of the herpes viruses (HSV1 most commonly). They are usually found around the lips and mouth, but the same infection can also affect other areas on the face such as the nose and eyes. Herpes infections take about 10 days to heal. They often occur again and again in the same spot. Other symptoms may include numbness and tingling in the involved skin, achiness, fever, and swollen glands in the neck. Colds, emotional stress, injuries, or excess sunlight exposure all seem to make herpes reappear. Herpes lip infections are contagious. Direct contact with these sores can spread the infection. It can also be spread to other parts of your own body.  TREATMENT   Herpes labialis is usually self-limited and resolves within 1 week. To reduce pain and swelling, apply ice packs frequently to the sores or suck on popsicles or frozen juice bars. Antiviral medicine may be used by mouth to shorten the duration of the breakout. Avoid spreading the infection by washing your hands often. Be careful not to touch your eyes or genital areas after handling the infected blisters. Do not kiss or have other intimate contact with others. After the blisters are completely healed you may resume contact. Use sunscreen to lessen recurrences.   If this is your first infection with herpes, or if you have a severe or repeated infections, your caregiver may prescribe one of the anti-viral drugs to speed up the healing. If you have sun-related flare-ups despite the use of sunscreen, starting oral anti-viral medicine before a prolonged exposure (going skiing or to the beach) can prevent most episodes.   SEEK IMMEDIATE MEDICAL CARE IF:  · You develop a headache, sleepiness, high fever, vomiting, or severe weakness.  · You have eye irritation, pain, blurred vision or redness.  · You develop a prolonged infection not getting better in 10  days.  Document Released: 08/18/2005 Document Revised: 11/10/2011 Document Reviewed: 06/22/2009  [employer]® Patient Information ©2015 [employer]. This information is not intended to replace advice given to you by your health care provider. Make sure you discuss any questions you have with your health care provider.

## 2014-12-11 NOTE — Progress Notes (Signed)
Subjective:    Patient ID: Stephens ShireMeredith G Parker, female    DOB: 1976/01/20, 39 y.o.   MRN: [id number]  Cough Associated symptoms include rhinorrhea. Pertinent negatives include no chest pain, chills, fever, postnasal drip, shortness of breath or wheezing.   Patient reports to the office for evaluation of cold sores.  She reports that for the past couple weeks she has had them for 2 weeks.  She reports that she is usually on zovirax for it and she reports that she did have some left over she took them at the first sign but she didn't have enough.  She reports that they are not that bad.  She reports that she is also out of her phenergan.  She has not had a physical in quite some time and is requesting diflucan.  She has not had blood work recently.  She does report that she is working out and is working on her diet.     Review of Systems  Constitutional: Negative for fever, chills and fatigue.  HENT: Positive for rhinorrhea. Negative for congestion, postnasal drip, sinus pressure and voice change.   Respiratory: Positive for cough. Negative for chest tightness, shortness of breath and wheezing.   Cardiovascular: Negative for chest pain and leg swelling.  Gastrointestinal: Positive for nausea. Negative for vomiting, diarrhea and blood in stool.  Skin: Positive for wound.       Objective:   Physical Exam  Constitutional: She is oriented to person, place, and time. She appears well-developed and well-nourished. No distress.  HENT:  Head: Normocephalic and atraumatic.    Mouth/Throat: Oropharynx is clear and moist and mucous membranes are normal. No trismus in the jaw. No oropharyngeal exudate.  Eyes: Conjunctivae and EOM are normal. Pupils are equal, round, and reactive to light. No scleral icterus.  Neck: Normal range of motion. Neck supple. No JVD present. No thyromegaly present.  Cardiovascular: Normal rate, regular rhythm, normal heart sounds and intact distal pulses.  Exam reveals no  gallop and no friction rub.   No murmur heard. Pulmonary/Chest: Effort normal and breath sounds normal. No respiratory distress. She has no wheezes. She has no rales. She exhibits no tenderness.  Abdominal: Soft. Bowel sounds are normal. She exhibits no distension and no mass. There is no tenderness. There is no rebound and no guarding.  Musculoskeletal: Normal range of motion.  Lymphadenopathy:    She has no cervical adenopathy.  Neurological: She is alert and oriented to person, place, and time.  Skin: Skin is warm and dry. She is not diaphoretic.  Psychiatric: She has a normal mood and affect. Her behavior is normal. Judgment and thought content normal.  Nursing note and vitals reviewed.   Filed Vitals:   12/11/14 1056  BP: 110/80  Pulse: 94  Temp: 98.2 F (36.8 C)  Resp: 16           Assessment & Plan:    1. 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  Dispense: 30 tablet; Refill: 0  2. Non-intractable vomiting with nausea, vomiting of unspecified type  - promethazine (PHENERGAN) 25 MG tablet; Take 1 tablet (25 mg total) by mouth every 6 (six) hours as needed for nausea or vomiting.  Dispense: 30 tablet; Refill: 0  Patient due for physical.  Will not give diflucan until we check basic labs then.

## 2015-02-12 ENCOUNTER — Encounter: Payer: Self-pay | Admitting: Internal Medicine

## 2015-02-12 ENCOUNTER — Ambulatory Visit (INDEPENDENT_AMBULATORY_CARE_PROVIDER_SITE_OTHER): Payer: Self-pay | Admitting: Internal Medicine

## 2015-02-12 VITALS — BP 106/84 | HR 78 | Temp 98.0°F | Resp 16 | Ht 66.0 in | Wt 157.0 lb

## 2015-02-12 DIAGNOSIS — R300 Dysuria: Secondary | ICD-10-CM

## 2015-02-12 MED ORDER — PROMETHAZINE HCL 25 MG PO TABS: 25.0000 mg | ORAL_TABLET | Freq: Four times a day (QID) | ORAL | Status: DC | PRN

## 2015-02-12 MED ORDER — FLUCONAZOLE 150 MG PO TABS: 150.0000 mg | ORAL_TABLET | Freq: Once | ORAL | Status: DC

## 2015-02-12 MED ORDER — SULFAMETHOXAZOLE-TRIMETHOPRIM 800-160 MG PO TABS: 1.0000 | ORAL_TABLET | Freq: Two times a day (BID) | ORAL | Status: DC

## 2015-02-12 NOTE — Progress Notes (Signed)
   Subjective:    Patient ID: Lori Marshall, female    DOB: 1976-03-22, 39 y.o.   MRN: [id number]  Dysuria  This is a new problem. The current episode started yesterday. The problem occurs every urination. The problem has been gradually worsening. The quality of the pain is described as burning. The pain is at a severity of 7/10. The pain is moderate. There has been no fever. There is no history of pyelonephritis. Associated symptoms include flank pain, frequency, hematuria and urgency. Pertinent negatives include no chills, nausea or vomiting. She has tried nothing (azo) for the symptoms. The treatment provided no relief. Her past medical history is significant for recurrent UTIs.      Review of Systems  Constitutional: Negative for fever and chills.  Gastrointestinal: Positive for abdominal pain. Negative for nausea and vomiting.  Genitourinary: Positive for dysuria, urgency, frequency, hematuria and flank pain. Negative for vaginal bleeding, vaginal discharge, difficulty urinating and menstrual problem.       Objective:   Physical Exam  Constitutional: She is oriented to person, place, and time. She appears well-developed and well-nourished. No distress.  HENT:  Head: Normocephalic and atraumatic.  Mouth/Throat: Oropharynx is clear and moist. No oropharyngeal exudate.  Eyes: Conjunctivae are normal. No scleral icterus.  Neck: Normal range of motion. Neck supple. No JVD present. No thyromegaly present.  Cardiovascular: Normal rate, regular rhythm, normal heart sounds and intact distal pulses.  Exam reveals no gallop and no friction rub.   No murmur heard. Pulmonary/Chest: Effort normal and breath sounds normal. No respiratory distress. She has no wheezes. She has no rales. She exhibits no tenderness.  Abdominal: Soft. Bowel sounds are normal. She exhibits no distension and no mass. There is tenderness. There is no rebound and no guarding.  Musculoskeletal: Normal range of motion.   Lymphadenopathy:    She has no cervical adenopathy.  Neurological: She is alert and oriented to person, place, and time.  Skin: Skin is warm and dry. She is not diaphoretic.  Psychiatric: She has a normal mood and affect. Her behavior is normal. Judgment and thought content normal.  Nursing note and vitals reviewed.         Assessment & Plan:    1. Dysuria  - Urinalysis, Routine w reflex microscopic (not at [facility]) - Culture, Urine - promethazine (PHENERGAN) 25 MG tablet; Take 1 tablet (25 mg total) by mouth every 6 (six) hours as needed for nausea or vomiting.  Dispense: 30 tablet; Refill: 0 - sulfamethoxazole-trimethoprim (BACTRIM DS,SEPTRA DS) 800-160 MG per tablet; Take 1 tablet by mouth 2 (two) times daily.  Dispense: 6 tablet; Refill: 0 - fluconazole (DIFLUCAN) 150 MG tablet; Take 1 tablet (150 mg total) by mouth once.  Dispense: 1 tablet; Refill: 0

## 2015-02-12 NOTE — Patient Instructions (Signed)

## 2015-02-13 LAB — URINALYSIS, MICROSCOPIC ONLY
Casts: NONE SEEN
Crystals: NONE SEEN
Squamous Epithelial / LPF: NONE SEEN

## 2015-02-13 LAB — URINALYSIS, ROUTINE W REFLEX MICROSCOPIC
Bilirubin Urine: NEGATIVE
Glucose, UA: NEGATIVE mg/dL
Ketones, ur: NEGATIVE mg/dL
Nitrite: POSITIVE — AB
Protein, ur: NEGATIVE mg/dL
Specific Gravity, Urine: <1.005 — ABNORMAL LOW (ref 1.005–1.030)
Urobilinogen, UA: 0.2 mg/dL (ref 0.0–1.0)
pH: 6.5 (ref 5.0–8.0)

## 2015-02-14 LAB — URINE CULTURE
Colony Count: NO GROWTH
Organism ID, Bacteria: NO GROWTH

## 2015-03-06 ENCOUNTER — Other Ambulatory Visit: Payer: Self-pay | Admitting: *Deleted

## 2015-03-06 DIAGNOSIS — R300 Dysuria: Secondary | ICD-10-CM

## 2015-03-06 MED ORDER — PROMETHAZINE HCL 25 MG PO TABS: 25.0000 mg | ORAL_TABLET | Freq: Three times a day (TID) | ORAL | Status: DC | PRN

## 2015-03-09 ENCOUNTER — Encounter (HOSPITAL_COMMUNITY): Payer: Self-pay

## 2015-03-09 ENCOUNTER — Emergency Department (HOSPITAL_COMMUNITY)
Admission: EM | Admit: 2015-03-09 | Discharge: 2015-03-09 | Disposition: A | Discharge status: Home / Self Care | Payer: Self-pay | Attending: Emergency Medicine | Admitting: Emergency Medicine

## 2015-03-09 ENCOUNTER — Emergency Department (HOSPITAL_COMMUNITY): Payer: Medicaid Other

## 2015-03-09 DIAGNOSIS — Z87440 Personal history of urinary (tract) infections: Secondary | ICD-10-CM | POA: Insufficient documentation

## 2015-03-09 DIAGNOSIS — Z8679 Personal history of other diseases of the circulatory system: Secondary | ICD-10-CM | POA: Insufficient documentation

## 2015-03-09 DIAGNOSIS — Z3202 Encounter for pregnancy test, result negative: Secondary | ICD-10-CM | POA: Insufficient documentation

## 2015-03-09 DIAGNOSIS — N946 Dysmenorrhea, unspecified: Secondary | ICD-10-CM | POA: Insufficient documentation

## 2015-03-09 DIAGNOSIS — K5900 Constipation, unspecified: Secondary | ICD-10-CM | POA: Insufficient documentation

## 2015-03-09 DIAGNOSIS — N939 Abnormal uterine and vaginal bleeding, unspecified: Secondary | ICD-10-CM

## 2015-03-09 DIAGNOSIS — R102 Pelvic and perineal pain: Secondary | ICD-10-CM

## 2015-03-09 DIAGNOSIS — Z8639 Personal history of other endocrine, nutritional and metabolic disease: Secondary | ICD-10-CM | POA: Insufficient documentation

## 2015-03-09 DIAGNOSIS — Z720 Tobacco use: Secondary | ICD-10-CM | POA: Insufficient documentation

## 2015-03-09 DIAGNOSIS — Z8659 Personal history of other mental and behavioral disorders: Secondary | ICD-10-CM | POA: Insufficient documentation

## 2015-03-09 LAB — URINE MICROSCOPIC-ADD ON

## 2015-03-09 LAB — COMPREHENSIVE METABOLIC PANEL
ALT: 37 U/L (ref 14–54)
AST: 38 U/L (ref 15–41)
Albumin: 4.5 g/dL (ref 3.5–5.0)
Alkaline Phosphatase: 75 U/L (ref 38–126)
Anion gap: 10 (ref 5–15)
BUN: 9 mg/dL (ref 6–20)
CO2: 24 mmol/L (ref 22–32)
Calcium: 9 mg/dL (ref 8.9–10.3)
Chloride: 102 mmol/L (ref 101–111)
Creatinine, Ser: 0.65 mg/dL (ref 0.44–1.00)
GFR calc Af Amer: >60 mL/min (ref >60)
GFR calc non Af Amer: >60 mL/min (ref >60)
Glucose, Bld: 112 mg/dL — ABNORMAL HIGH (ref 65–99)
Potassium: 4.4 mmol/L (ref 3.5–5.1)
Sodium: 136 mmol/L (ref 135–145)
Total Bilirubin: 0.8 mg/dL (ref 0.3–1.2)
Total Protein: 8 g/dL (ref 6.5–8.1)

## 2015-03-09 LAB — I-STAT BETA HCG BLOOD, ED (MC, WL, AP ONLY): I-stat hCG, quantitative: <5 m[IU]/mL (ref <5)

## 2015-03-09 LAB — WET PREP, GENITAL
Trich, Wet Prep: NONE SEEN
Yeast Wet Prep HPF POC: NONE SEEN

## 2015-03-09 LAB — URINALYSIS, ROUTINE W REFLEX MICROSCOPIC
Bilirubin Urine: NEGATIVE
Glucose, UA: NEGATIVE mg/dL
Ketones, ur: NEGATIVE mg/dL
Nitrite: NEGATIVE
Protein, ur: NEGATIVE mg/dL
Specific Gravity, Urine: 1.02 (ref 1.005–1.030)
Urobilinogen, UA: 0.2 mg/dL (ref 0.0–1.0)
pH: 5.5 (ref 5.0–8.0)

## 2015-03-09 LAB — CBC WITH DIFFERENTIAL/PLATELET
Basophils Absolute: 0 10*3/uL (ref 0.0–0.1)
Basophils Relative: 0 % (ref 0–1)
Eosinophils Absolute: 0.2 10*3/uL (ref 0.0–0.7)
Eosinophils Relative: 2 % (ref 0–5)
HCT: 44.7 % (ref 36.0–46.0)
Hemoglobin: 14.9 g/dL (ref 12.0–15.0)
Lymphocytes Relative: 18 % (ref 12–46)
Lymphs Abs: 1.6 10*3/uL (ref 0.7–4.0)
MCH: 31.6 pg (ref 26.0–34.0)
MCHC: 33.3 g/dL (ref 30.0–36.0)
MCV: 94.9 fL (ref 78.0–100.0)
Monocytes Absolute: 0.5 10*3/uL (ref 0.1–1.0)
Monocytes Relative: 5 % (ref 3–12)
Neutro Abs: 6.7 10*3/uL (ref 1.7–7.7)
Neutrophils Relative %: 75 % (ref 43–77)
Platelets: 304 10*3/uL (ref 150–400)
RBC: 4.71 MIL/uL (ref 3.87–5.11)
RDW: 11.7 % (ref 11.5–15.5)
WBC: 8.9 10*3/uL (ref 4.0–10.5)

## 2015-03-09 LAB — LIPASE, BLOOD: Lipase: 26 U/L (ref 22–51)

## 2015-03-09 LAB — POC URINE PREG, ED: Preg Test, Ur: NEGATIVE

## 2015-03-09 MED ORDER — HYDROMORPHONE HCL 1 MG/ML IJ SOLN
0.5000 mg | INTRAMUSCULAR | Status: DC | PRN
  Administered 2015-03-09 (×2): 0.5 mg via INTRAVENOUS
  Filled 2015-03-09 (×2): qty 1

## 2015-03-09 MED ORDER — ONDANSETRON HCL 4 MG/2ML IJ SOLN
4.0000 mg | Freq: Once | INTRAMUSCULAR | Status: AC
  Administered 2015-03-09: 4 mg via INTRAVENOUS
  Filled 2015-03-09: qty 2

## 2015-03-09 MED ORDER — TRAMADOL HCL 50 MG PO TABS: 50.0000 mg | ORAL_TABLET | Freq: Four times a day (QID) | ORAL | Status: DC | PRN

## 2015-03-09 MED ORDER — PROMETHAZINE HCL 25 MG PO TABS: 25.0000 mg | ORAL_TABLET | Freq: Four times a day (QID) | ORAL | Status: DC | PRN

## 2015-03-09 MED ORDER — SODIUM CHLORIDE 0.9 % IV SOLN
1000.0000 mL | Freq: Once | INTRAVENOUS | Status: AC
  Administered 2015-03-09: 1000 mL via INTRAVENOUS

## 2015-03-09 MED ORDER — SODIUM CHLORIDE 0.9 % IV SOLN
1000.0000 mL | INTRAVENOUS | Status: DC
  Administered 2015-03-09: 1000 mL via INTRAVENOUS

## 2015-03-09 MED ORDER — HYDROCODONE-ACETAMINOPHEN 5-325 MG PO TABS: 1.0000 | ORAL_TABLET | ORAL | Status: DC | PRN

## 2015-03-09 MED ORDER — NAPROXEN 500 MG PO TABS: 500.0000 mg | ORAL_TABLET | Freq: Two times a day (BID) | ORAL | Status: DC

## 2015-03-09 NOTE — ED Notes (Signed)
Family at bedside. 

## 2015-03-09 NOTE — ED Provider Notes (Signed)
CSN: [id number]     Arrival date & time 03/09/15  0854 History   First MD Initiated Contact with Patient 03/09/15 [phone]     Chief Complaint  Patient presents with  . Abdominal Pain  . Vaginal Bleeding    HPI Comments: Started with nausea and vomiting on Monday.  Tuesday she felt better.  She started feeling bloated on Monday as well but that progressed over the last couple of days.  Yesterday the pain increased.  Pain is in the lower abdomen.  Waxing and waning in nature.  More constant today.  Patient is a 39 y.o. female presenting with abdominal pain and vaginal bleeding. The history is provided by the patient.  Abdominal Pain Pain location: lower. Pain quality: aching and sharp   Pain radiates to:  Does not radiate Pain severity:  Severe Duration:  4 days Progression:  Worsening Exacerbated by: not increased with eating, drinking or movement. Associated symptoms: constipation, nausea, vaginal bleeding and vomiting   Associated symptoms: no diarrhea, no dysuria and no fever   Vaginal Bleeding Associated symptoms: abdominal pain and nausea   Associated symptoms: no dysuria and no fever     Past Medical History  Diagnosis Date  . Migraine, unspecified, without mention of intractable migraine without mention of status migrainosus   . Vomiting alone   . Irritable bowel syndrome   . Esophageal reflux   . Diarrhea   . Panic disorder without agoraphobia   . Hypothyroid   . Depressive disorder, not elsewhere classified   . Depression   . Anxiety   UTI on the 13th of last month Past Surgical History  Procedure Laterality Date  . Wisdom tooth extraction    . Appendectomy    . Cholecystectomy  08/19/2011    Procedure: LAPAROSCOPIC CHOLECYSTECTOMY WITH INTRAOPERATIVE CHOLANGIOGRAM;  Surgeon: Velora Hecklerodd M Gerkin, MD;  Location: [facility];  Service: General;  Laterality: N/A;  . Colon surgery     Family History  Problem Relation Age of Onset  . Diabetes Father   . Melanoma Father   .  Colon polyps Father   . Colon cancer Neg Hx    History  Substance Use Topics  . Smoking status: Current Some Day Smoker -- 0.50 packs/day for 5 years    Types: Cigarettes  . Smokeless tobacco: Never Used  . Alcohol Use: No     Comment: socially   OB History    Gravida Para Term Preterm AB TAB SAB Ectopic Multiple Living   3    3 1 2    0     Review of Systems  Constitutional: Negative for fever.  Gastrointestinal: Positive for nausea, vomiting, abdominal pain and constipation. Negative for diarrhea.  Genitourinary: Positive for vaginal bleeding. Negative for dysuria.  All other systems reviewed and are negative.     Allergies  Dust mite extract; Medrol; and Novocain  Home Medications   Prior to Admission medications   Medication Sig Start Date End Date Taking? Authorizing Provider  acyclovir (ZOVIRAX) 800 MG tablet Take 1 tablet (800 mg total) by mouth 3 (three) times daily. Patient taking differently: Take 800 mg by mouth 3 (three) times daily as needed (cold sores).  12/11/14  Yes Courtney Forcucci, PA-C  HYDROcodone-acetaminophen (NORCO/VICODIN) 5-325 MG per tablet Take 1 tablet by mouth every 4 (four) hours as needed. 03/09/15   Linwood DibblesJon Keyondre Hepburn, MD  promethazine (PHENERGAN) 25 MG tablet Take 1 tablet (25 mg total) by mouth every 6 (six) hours as needed for nausea  or vomiting. 03/09/15   Linwood Dibbles, MD  traMADol (ULTRAM) 50 MG tablet Take 1 tablet (50 mg total) by mouth every 6 (six) hours as needed. 03/09/15   Linwood Dibbles, MD   BP 102/72 mmHg  Pulse 60  Temp(Src) 97.9 F (36.6 C) (Oral)  Resp 16  SpO2 98%  LMP 02/19/2015 (Approximate) Physical Exam  Constitutional: She appears well-developed and well-nourished. No distress.  HENT:  Head: Normocephalic and atraumatic.  Right Ear: External ear normal.  Left Ear: External ear normal.  Eyes: Conjunctivae are normal. Right eye exhibits no discharge. Left eye exhibits no discharge. No scleral icterus.  Neck: Neck supple. No tracheal  deviation present.  Cardiovascular: Normal rate, regular rhythm and intact distal pulses.   Pulmonary/Chest: Effort normal and breath sounds normal. No stridor. No respiratory distress. She has no wheezes. She has no rales.  Abdominal: Soft. Bowel sounds are normal. She exhibits no distension. There is tenderness in the suprapubic area. There is no rebound and no guarding.  Genitourinary: Uterus normal. Pelvic exam was performed with patient supine. There is no rash, tenderness or lesion on the right labia. There is no rash, tenderness or lesion on the left labia. Cervix exhibits no motion tenderness and no discharge. Right adnexum displays no mass, no tenderness and no fullness. Left adnexum displays no mass, no tenderness and no fullness. There is bleeding in the vagina. Vaginal discharge found.  Small amount of blood from the cervical os, no clots  Musculoskeletal: She exhibits no edema or tenderness.  Neurological: She is alert. She has normal strength. No cranial nerve deficit (no facial droop, extraocular movements intact, no slurred speech) or sensory deficit. She exhibits normal muscle tone. She displays no seizure activity. Coordination normal.  Skin: Skin is warm and dry. No rash noted.  Psychiatric: She has a normal mood and affect.  Nursing note and vitals reviewed.   ED Course  Procedures (including critical care time) Labs Review Labs Reviewed  WET PREP, GENITAL - Abnormal; Notable for the following:    Clue Cells Wet Prep HPF POC FEW (*)    WBC, Wet Prep HPF POC FEW (*)    All other components within normal limits  COMPREHENSIVE METABOLIC PANEL - Abnormal; Notable for the following:    Glucose, Bld 112 (*)    All other components within normal limits  URINALYSIS, ROUTINE W REFLEX MICROSCOPIC (NOT AT [facility]) - Abnormal; Notable for the following:    APPearance CLOUDY (*)    Hgb urine dipstick LARGE (*)    Leukocytes, UA TRACE (*)    All other components within normal limits   URINE MICROSCOPIC-ADD ON - Abnormal; Notable for the following:    Squamous Epithelial / LPF FEW (*)    Bacteria, UA MANY (*)    All other components within normal limits  URINE CULTURE  CBC WITH DIFFERENTIAL/PLATELET  LIPASE, BLOOD  RPR  HIV ANTIBODY (ROUTINE TESTING)  POC URINE PREG, ED  I-STAT BETA HCG BLOOD, ED ([facility], [facility], [facility] ONLY)  GC/CHLAMYDIA PROBE AMP ([place]) NOT AT [facility]    Imaging Review US Transvaginal Non-ob  03/09/2015   CLINICAL DATA:  Left adnexal pain and fullness with vaginal bleeding for 5 days. Initial encounter.  EXAM: TRANSABDOMINAL AND TRANSVAGINAL ULTRASOUND OF PELVIS  TECHNIQUE: Both transabdominal and transvaginal ultrasound examinations of the pelvis were performed. Transabdominal technique was performed for global imaging of the pelvis including uterus, ovaries, adnexal regions, and pelvic cul-de-sac. It was necessary to proceed with endovaginal  exam following the transabdominal exam to visualize the ovaries.  COMPARISON:  Pelvic ultrasound 12/09/2011. CT abdomen and pelvis 02/10/2013.  FINDINGS: Uterus  Measurements: 8.1 x 3.3 x 4.4 cm. No fibroids or other mass visualized.  Endometrium  Thickness: 0.5 cm.  No focal abnormality visualized.  Right ovary  Measurements: 2.9 x 2.0 x 2.6 cm. Normal appearance/no adnexal mass.  Left ovary  Measurements: 2.6 x 1.6 x 1.6 cm. Normal appearance/no adnexal mass.  Other findings  A few nabothian cysts are incidentally noted.  IMPRESSION: Negative exam.   Electronically Signed   By: Drusilla Kanner M.D.   On: 03/09/2015 12:55   US Pelvis Complete  03/09/2015   CLINICAL DATA:  Left adnexal pain and fullness with vaginal bleeding for 5 days. Initial encounter.  EXAM: TRANSABDOMINAL AND TRANSVAGINAL ULTRASOUND OF PELVIS  TECHNIQUE: Both transabdominal and transvaginal ultrasound examinations of the pelvis were performed. Transabdominal technique was performed for global imaging of the pelvis including uterus, ovaries, adnexal  regions, and pelvic cul-de-sac. It was necessary to proceed with endovaginal exam following the transabdominal exam to visualize the ovaries.  COMPARISON:  Pelvic ultrasound 12/09/2011. CT abdomen and pelvis 02/10/2013.  FINDINGS: Uterus  Measurements: 8.1 x 3.3 x 4.4 cm. No fibroids or other mass visualized.  Endometrium  Thickness: 0.5 cm.  No focal abnormality visualized.  Right ovary  Measurements: 2.9 x 2.0 x 2.6 cm. Normal appearance/no adnexal mass.  Left ovary  Measurements: 2.6 x 1.6 x 1.6 cm. Normal appearance/no adnexal mass.  Other findings  A few nabothian cysts are incidentally noted.  IMPRESSION: Negative exam.   Electronically Signed   By: Drusilla Kanner M.D.   On: 03/09/2015 12:55      MDM   Final diagnoses:  Dysmenorrhea  Abnormal vaginal bleeding    No acute abnormalities noted on ultrasound.  Labs unremarkable.  Suspect her symptoms are related to her abnormal vaginal bleeding.  Pt has mentioned some urinary sx. Doubt uti based on ua, more suggestive of contamination.  Follow up with PCP, ie endometriosis, interstitial cystitis   Linwood Dibbles, MD 03/09/15 1413

## 2015-03-09 NOTE — ED Notes (Signed)
Pt presents with c/o lower abdominal pain that started on Monday of this week. Pt also c/o vaginal bleeding that started on Wednesday, LMP approx 6/20. Pt reports some nausea and vomiting but no diarrhea.

## 2015-03-09 NOTE — Discharge Instructions (Signed)
Abnormal Uterine Bleeding Abnormal uterine bleeding can affect women at various stages in life, including teenagers, women in their reproductive years, pregnant women, and women who have reached menopause. Several kinds of uterine bleeding are considered abnormal, including:  Bleeding or spotting between periods.   Bleeding after sexual intercourse.   Bleeding that is heavier or more than normal.   Periods that last longer than usual.  Bleeding after menopause.  Many cases of abnormal uterine bleeding are minor and simple to treat, while others are more serious. Any type of abnormal bleeding should be evaluated by your health care provider. Treatment will depend on the cause of the bleeding. HOME CARE INSTRUCTIONS Monitor your condition for any changes. The following actions may help to alleviate any discomfort you are experiencing:  Avoid the use of tampons and douches as directed by your health care provider.  Change your pads frequently. You should get regular pelvic exams and Pap tests. Keep all follow-up appointments for diagnostic tests as directed by your health care provider.  SEEK MEDICAL CARE IF:   Your bleeding lasts more than 1 week.   You feel dizzy at times.  SEEK IMMEDIATE MEDICAL CARE IF:   You pass out.   You are changing pads every 15 to 30 minutes.   You have abdominal pain.  You have a fever.   You become sweaty or weak.   You are passing large blood clots from the vagina.   You start to feel nauseous and vomit. MAKE SURE YOU:   Understand these instructions.  Will watch your condition.  Will get help right away if you are not doing well or get worse. Document Released: 08/18/2005 Document Revised: 08/23/2013 Document Reviewed: 03/17/2013 Exit[employer] Patient Information 2015 [employer]. This information is not intended to replace advice given to you by your health care provider. Make sure you discuss any questions you have with your  health care provider.  

## 2015-03-09 NOTE — ED Notes (Signed)
Patient transported to Ultrasound 

## 2015-03-10 LAB — HIV ANTIBODY (ROUTINE TESTING W REFLEX): HIV Screen 4th Generation wRfx: NONREACTIVE

## 2015-03-10 LAB — SYPHILIS: RPR W/REFLEX TO RPR TITER AND TREPONEMAL ANTIBODIES, TRADITIONAL SCREENING AND DIAGNOSIS ALGORITHM: RPR Ser Ql: NONREACTIVE

## 2015-03-11 LAB — URINE CULTURE: Culture: >=100000

## 2015-03-12 LAB — GC/CHLAMYDIA PROBE AMP (~~LOC~~) NOT AT ARMC
Chlamydia: NEGATIVE
Neisseria Gonorrhea: NEGATIVE

## 2015-04-24 ENCOUNTER — Encounter: Payer: Self-pay | Admitting: Internal Medicine

## 2015-04-24 ENCOUNTER — Ambulatory Visit (INDEPENDENT_AMBULATORY_CARE_PROVIDER_SITE_OTHER): Payer: Self-pay | Admitting: Internal Medicine

## 2015-04-24 VITALS — BP 124/76 | HR 82 | Temp 98.0°F | Resp 18 | Ht 66.0 in | Wt 154.0 lb

## 2015-04-24 DIAGNOSIS — K219 Gastro-esophageal reflux disease without esophagitis: Secondary | ICD-10-CM

## 2015-04-24 DIAGNOSIS — R109 Unspecified abdominal pain: Secondary | ICD-10-CM

## 2015-04-24 MED ORDER — PROMETHAZINE HCL 25 MG PO TABS: 25.0000 mg | ORAL_TABLET | Freq: Four times a day (QID) | ORAL | Status: DC | PRN

## 2015-04-24 NOTE — Progress Notes (Signed)
Patient ID: Lori Marshall, female   DOB: 1975/09/12, 39 y.o.   MRN: [id number]  Assessment and Plan:   Pre-diabetes: -Continue diet and exercise.   Vitamin D Def: -continue medications.   Abdominal pain -seen by both ED and Obgyn -pain has been resolved -doesn't want to do birth control currently -recommend just taking NSAIDs prn  No labs done today as patient is self pay.  Continue diet and meds as discussed. Further disposition pending results of labs.  HPI 39 y.o. female  presents for 3 month follow up with hypertension, hyperlipidemia, prediabetes and vitamin D.   Her blood pressure has been controlled at home, today their BP is BP: 124/76 mmHg.   She does workout. She denies chest pain, shortness of breath, dizziness.   She has been working on diet and exercise for prediabetes, and denies foot ulcerations, hyperglycemia, hypoglycemia , increased appetite, nausea, paresthesia of the feet, polydipsia, polyuria, visual disturbances, vomiting and weight loss. Last A1C in the office was: No results found for: HGBA1C  Patient is on Vitamin D supplement. No results found for: VD25OH    Patient did go to the ER for severe abdominal and vaginal bleeding.  She reports that she had negative testing in the ER.  She was told that she either had a ruptured ovarian cyst vs. Endometriosis vs. Possible interstitial cystitis.  She has had no further pain or heavy vaginal bleeding.  She is not interested in taking birth control.     Current Medications:  Current Outpatient Prescriptions on File Prior to Visit  Medication Sig Dispense Refill  . acyclovir (ZOVIRAX) 800 MG tablet Take 1 tablet (800 mg total) by mouth 3 (three) times daily. (Patient taking differently: Take 800 mg by mouth 3 (three) times daily as needed (cold sores). ) 30 tablet 0  . naproxen (NAPROSYN) 500 MG tablet Take 1 tablet (500 mg total) by mouth 2 (two) times daily. 30 tablet 0  . promethazine (PHENERGAN) 25 MG tablet  Take 1 tablet (25 mg total) by mouth every 6 (six) hours as needed for nausea or vomiting. 16 tablet 0   No current facility-administered medications on file prior to visit.    Medical History:  Past Medical History  Diagnosis Date  . Migraine, unspecified, without mention of intractable migraine without mention of status migrainosus   . Vomiting alone   . Irritable bowel syndrome   . Esophageal reflux   . Diarrhea   . Panic disorder without agoraphobia   . Hypothyroid   . Depressive disorder, not elsewhere classified   . Depression   . Anxiety     Allergies:  Allergies  Allergen Reactions  . Dust Mite Extract Swelling  . Medrol [Methylprednisolone]   . Novocain [Procaine] Palpitations     Review of Systems:  Review of Systems  Constitutional: Negative for fever, chills and malaise/fatigue.  HENT: Negative for congestion, ear pain and sore throat.   Eyes: Negative.   Respiratory: Negative for cough, shortness of breath and wheezing.   Cardiovascular: Negative for chest pain, palpitations and leg swelling.  Gastrointestinal: Positive for heartburn. Negative for abdominal pain, diarrhea, constipation, blood in stool and melena.  Skin: Negative.   Neurological: Negative for headaches.    Family history- Review and unchanged  Social history- Review and unchanged  Physical Exam: BP 124/76 mmHg  Pulse 82  Temp(Src) 98 F (36.7 C) (Temporal)  Resp 18  Ht 5\' 6"  (1.676 m)  Wt 154 lb (69.854 kg)  BMI  24.87 kg/m2  LMP 04/03/2015 Wt Readings from Last 3 Encounters:  04/24/15 154 lb (69.854 kg)  02/12/15 157 lb (71.215 kg)  12/11/14 157 lb (71.215 kg)    General Appearance: Well nourished well developed, in no apparent distress. Eyes: PERRLA, EOMs, conjunctiva no swelling or erythema ENT/Mouth: Ear canals normal without obstruction, swelling, erythma, discharge.  TMs normal bilaterally.  Oropharynx moist, clear, without exudate, or postoropharyngeal swelling. Neck:  Supple, thyroid normal,no cervical adenopathy  Respiratory: Respiratory effort normal, Breath sounds clear A&P without rhonchi, wheeze, or rale.  No retractions, no accessory usage. Cardio: RRR with no MRGs. Brisk peripheral pulses without edema.  Abdomen: Soft, + BS,  Non tender, no guarding, rebound, hernias, masses. Musculoskeletal: Full ROM, 5/5 strength, Normal gait Skin: Warm, dry without rashes, lesions, ecchymosis.  Neuro: Awake and oriented X 3, Cranial nerves intact. Normal muscle tone, no cerebellar symptoms. Psych: Normal affect, Insight and Judgment appropriate.    Terri Piedra, PA-C 2:19 PM [facility]

## 2015-04-24 NOTE — Patient Instructions (Signed)
Please use mederma to help lighten the burn scarring on your leg  Please start using flonase or nasacort to help with your allergies.    Please take phenergan as needed.  You can try lotromin cream or foot spray to help with itching.  Keep feet clean and dry.

## 2015-05-04 ENCOUNTER — Encounter: Payer: Self-pay | Admitting: Internal Medicine

## 2015-05-04 ENCOUNTER — Other Ambulatory Visit: Payer: Self-pay | Admitting: Internal Medicine

## 2015-05-04 MED ORDER — TRAMADOL HCL 50 MG PO TABS
50.0000 mg | ORAL_TABLET | Freq: Four times a day (QID) | ORAL | Status: AC | PRN
Start: 2015-05-04 — End: 2016-05-03

## 2015-07-25 ENCOUNTER — Ambulatory Visit: Payer: Self-pay | Admitting: Internal Medicine

## 2015-09-13 ENCOUNTER — Other Ambulatory Visit: Payer: Self-pay | Admitting: Internal Medicine

## 2015-11-28 ENCOUNTER — Encounter: Payer: Self-pay | Admitting: Internal Medicine

## 2016-07-15 ENCOUNTER — Encounter: Payer: Self-pay | Admitting: Internal Medicine

## 2016-07-15 ENCOUNTER — Ambulatory Visit (INDEPENDENT_AMBULATORY_CARE_PROVIDER_SITE_OTHER): Payer: Self-pay | Admitting: Internal Medicine

## 2016-07-15 VITALS — BP 118/84 | HR 72 | Temp 98.0°F | Resp 16 | Ht 66.0 in | Wt 160.0 lb

## 2016-07-15 DIAGNOSIS — J0140 Acute pansinusitis, unspecified: Secondary | ICD-10-CM

## 2016-07-15 MED ORDER — PROMETHAZINE HCL 25 MG PO TABS: 25.0000 mg | ORAL_TABLET | Freq: Four times a day (QID) | ORAL | 2 refills | Status: DC | PRN

## 2016-07-15 MED ORDER — FLUTICASONE PROPIONATE 50 MCG/ACT NA SUSP: 2.0000 | Freq: Every day | NASAL | 0 refills | Status: DC

## 2016-07-15 MED ORDER — PROMETHAZINE-DM 6.25-15 MG/5ML PO SYRP: ORAL_SOLUTION | ORAL | 1 refills | Status: DC

## 2016-07-15 MED ORDER — AZITHROMYCIN 250 MG PO TABS: ORAL_TABLET | ORAL | 0 refills | Status: DC

## 2016-07-15 MED ORDER — ACYCLOVIR 800 MG PO TABS: 800.0000 mg | ORAL_TABLET | Freq: Three times a day (TID) | ORAL | 3 refills | Status: DC

## 2016-07-15 NOTE — Progress Notes (Signed)
HPI  Patient presents to the office for evaluation of cough.  It has been going on for 4 days.  Patient reports minimal coughing.  They also endorse postnasal drip and headache,  sore throat, nausea, vomiting 3x per day, she is unsure whether the vomiting is happening at the same time, low back pain which started at approximately the same time.  .  They have tried aspirin, ibuprofen with no relief. They report that nothing has worked.  They admits to other sick contacts.  She has had some sick contacts at work.  She reports that the last time she vomited was this morning.  She is bringing up mucous and ginger ale.  She is not able to keep anything down.    Review of Systems  Constitutional: Positive for malaise/fatigue. Negative for chills and fever.  HENT: Positive for congestion, ear pain, hearing loss and sore throat.   Respiratory: Positive for cough. Negative for sputum production, shortness of breath and wheezing.   Cardiovascular: Negative for chest pain, palpitations and leg swelling.  Gastrointestinal: Positive for nausea and vomiting. Negative for blood in stool, constipation, diarrhea and melena.  Genitourinary: Negative for dysuria, flank pain, frequency, hematuria and urgency.  Neurological: Positive for headaches.    PE:  Vitals:   07/15/16 1400  BP: 118/84  Pulse: 72  Resp: 16  Temp: 98 F (36.7 C)    General:  Alert and non-toxic, WDWN, NAD HEENT: NCAT, PERLA, EOM normal, no occular discharge or erythema.  Nasal mucosal edema with sinus tenderness to palpation.  Oropharynx clear with minimal oropharyngeal edema and erythema.  Mucous membranes moist and pink. Neck:  Cervical adenopathy Chest:  RRR no MRGs.  Lungs clear to auscultation A&P with no wheezes rhonchi or rales.   Abdomen: +BS x 4 quadrants, soft, non-tender, no guarding, rigidity, or rebound. Skin: warm and dry no rash Neuro: A&Ox4, CN II-XII grossly intact  Assessment and Plan:   1. Acute non-recurrent  pansinusitis -avoid prednisone as intolerant -patient aware not to take phenergan DM and phenergan together. -cont nasal saline -cont cetirizine -take tylenol or ibuprofen as needed.   -no evidence of peritonsillar abscess.   - promethazine-dextromethorphan (PROMETHAZINE-DM) 6.25-15 MG/5ML syrup; Take 10 mL PO q8hrs for cough symptoms and nausea  Dispense: 360 mL; Refill: 1 - azithromycin (ZITHROMAX Z-PAK) 250 MG tablet; 2 po day one, then 1 daily x 4 days  Dispense: 6 tablet; Refill: 0 - fluticasone (FLONASE) 50 MCG/ACT nasal spray; Place 2 sprays into both nostrils daily.  Dispense: 16 g; Refill: 0

## 2016-07-15 NOTE — Patient Instructions (Signed)
Please take the zpak daily until it is completely gone.  Please take cetirizine once nightly to help dry up congestion.  Please take the phenergan dm cough syrup 3 times a day to help with congestion and to help settle your stomach.  Do not take your usual phenergan with it.  Please take benadryl 50 mg at bedtime.  Please use 2 sprays of flonase per nostril.  Please use nasal saline to help rinse out your nostrils.  Please drink plenty of water and very slowly advance your diet back to normal.  Please take tylenol 1000 mg and ibuprofen 800 mg to help with headache.

## 2016-08-21 ENCOUNTER — Ambulatory Visit (INDEPENDENT_AMBULATORY_CARE_PROVIDER_SITE_OTHER): Payer: Self-pay | Admitting: Internal Medicine

## 2016-08-21 ENCOUNTER — Encounter: Payer: Self-pay | Admitting: Internal Medicine

## 2016-08-21 VITALS — BP 138/96 | HR 88 | Temp 98.2°F | Resp 14 | Ht 66.0 in | Wt 165.0 lb

## 2016-08-21 DIAGNOSIS — J029 Acute pharyngitis, unspecified: Secondary | ICD-10-CM

## 2016-08-21 MED ORDER — DEXAMETHASONE SODIUM PHOSPHATE 10 MG/ML IJ SOLN
10.0000 mg | Freq: Once | INTRAMUSCULAR | Status: AC
  Administered 2016-08-21: 10 mg via INTRAMUSCULAR

## 2016-08-21 MED ORDER — AMOXICILLIN 500 MG PO TABS: 500.0000 mg | ORAL_TABLET | Freq: Three times a day (TID) | ORAL | 0 refills | Status: DC | PRN

## 2016-08-21 MED ORDER — MECLIZINE HCL 25 MG PO TABS: 25.0000 mg | ORAL_TABLET | Freq: Three times a day (TID) | ORAL | 1 refills | Status: DC | PRN

## 2016-08-21 NOTE — Progress Notes (Signed)
HPI  Patient presents to the office for evaluation of dizziness.  She reports that she has been having worsening dizziness for the last week.  She has some neck soreness and it hurts to turn her head.  She notes that she has some pressure over her eyes and she feels like it is full.  She feels like she can feel stuff moving around.  She notes that she has had no ear pain.  She does not some mild vision changes.  She has not had her eyes checked since last year.  She does have glasses.  She does wear her glasses only for driving or distance.  She does have some mild vertigo.  She feels like she is spinning sometimes.  She gets it more with movement.     Review of Systems  Constitutional: Positive for malaise/fatigue. Negative for chills and fever.  HENT: Positive for congestion and sinus pain. Negative for ear pain, hearing loss and sore throat.   Eyes: Positive for blurred vision.  Respiratory: Negative for cough, shortness of breath and wheezing.   Cardiovascular: Negative for chest pain, palpitations and leg swelling.  Musculoskeletal: Positive for neck pain.  Neurological: Positive for headaches. Negative for sensory change.    PE:  Vitals:   08/21/16 1054  BP: (!) 138/96  Pulse: 88  Resp: 14  Temp: 98.2 F (36.8 C)    General:  Alert and non-toxic, WDWN, NAD HEENT: NCAT, PERLA, EOM normal, no occular discharge or erythema.  Nasal mucosal edema with sinus tenderness to palpation.  Oropharynx clear with minimal oropharyngeal edema and erythema.  Mucous membranes moist and pink. White exudate on the posterior throat.   Neck:  Cervical adenopathy Chest:  RRR no MRGs.  Lungs clear to auscultation A&P with no wheezes rhonchi or rales.   Abdomen: +BS x 4 quadrants, soft, non-tender, no guarding, rigidity, or rebound. Skin: warm and dry no rash Neuro: A&Ox4, CN II-XII grossly intact  Assessment and Plan:   1. Acute pharyngitis, unspecified etiology -likely strep throat, given self  pay will avoid rapid strep -amoxicillin -flonase -meclizine -zyrtec -if trouble swallowing, severe headache or any other concerning symptoms to go to ER - dexamethasone (DECADRON) injection 10 mg; Inject 1 mL (10 mg total) into the muscle once.

## 2016-11-17 ENCOUNTER — Other Ambulatory Visit: Payer: Self-pay | Admitting: Internal Medicine

## 2017-03-02 ENCOUNTER — Other Ambulatory Visit: Payer: Self-pay | Admitting: Physician Assistant

## 2017-03-02 ENCOUNTER — Telehealth: Payer: Self-pay | Admitting: Physician Assistant

## 2017-03-02 MED ORDER — PROMETHAZINE HCL 25 MG PO TABS: 25.0000 mg | ORAL_TABLET | Freq: Four times a day (QID) | ORAL | 2 refills | Status: DC | PRN

## 2017-03-02 NOTE — Telephone Encounter (Signed)
Patient calling with nausea and vomiting x 1 weeks. Will send in phenergan but needs OV if not better or needs to go to ER if worse. If any AB pain, dark black stool, shortness of breath, chest pain, vomiting especially coffee grounds/blood. Nausea can represent many different illness.

## 2017-03-02 NOTE — Telephone Encounter (Signed)
Pt informed of Rx & instructions that was sent into pharmacy Pt agreed & hung up

## 2017-08-22 ENCOUNTER — Other Ambulatory Visit: Payer: Self-pay | Admitting: Physician Assistant

## 2017-11-03 ENCOUNTER — Other Ambulatory Visit (HOSPITAL_COMMUNITY): Payer: Self-pay | Admitting: Obstetrics & Gynecology

## 2017-11-03 DIAGNOSIS — Z3141 Encounter for fertility testing: Secondary | ICD-10-CM

## 2017-11-10 ENCOUNTER — Ambulatory Visit (HOSPITAL_COMMUNITY)
Admission: RE | Admit: 2017-11-10 | Discharge: 2017-11-10 | Disposition: A | Discharge status: Home / Self Care | Payer: Self-pay | Source: Ambulatory Visit | Attending: Obstetrics & Gynecology | Admitting: Obstetrics & Gynecology

## 2017-11-10 DIAGNOSIS — R9389 Abnormal findings on diagnostic imaging of other specified body structures: Secondary | ICD-10-CM | POA: Insufficient documentation

## 2017-11-10 DIAGNOSIS — Z3141 Encounter for fertility testing: Secondary | ICD-10-CM | POA: Insufficient documentation

## 2017-11-10 MED ORDER — IOPAMIDOL (ISOVUE-300) INJECTION 61%
30.0000 mL | Freq: Once | INTRAVENOUS | Status: AC | PRN
  Administered 2017-11-10: 3 mL

## 2018-04-27 ENCOUNTER — Encounter (HOSPITAL_COMMUNITY): Payer: Self-pay | Admitting: Emergency Medicine

## 2018-04-27 ENCOUNTER — Ambulatory Visit (HOSPITAL_COMMUNITY)
Admission: EM | Admit: 2018-04-27 | Discharge: 2018-04-27 | Disposition: A | Discharge status: Home / Self Care | Payer: Self-pay | Attending: Family Medicine | Admitting: Family Medicine

## 2018-04-27 DIAGNOSIS — R112 Nausea with vomiting, unspecified: Secondary | ICD-10-CM

## 2018-04-27 DIAGNOSIS — R1084 Generalized abdominal pain: Secondary | ICD-10-CM

## 2018-04-27 DIAGNOSIS — Z3202 Encounter for pregnancy test, result negative: Secondary | ICD-10-CM

## 2018-04-27 DIAGNOSIS — R109 Unspecified abdominal pain: Secondary | ICD-10-CM

## 2018-04-27 LAB — POCT URINALYSIS DIP (DEVICE)
Glucose, UA: NEGATIVE mg/dL
Hgb urine dipstick: NEGATIVE
Ketones, ur: 40 mg/dL — AB
Leukocytes, UA: NEGATIVE
Nitrite: NEGATIVE
Protein, ur: 100 mg/dL — AB
Specific Gravity, Urine: 1.015 (ref 1.005–1.030)
Urobilinogen, UA: 0.2 mg/dL (ref 0.0–1.0)
pH: 7.5 (ref 5.0–8.0)

## 2018-04-27 LAB — POCT I-STAT, CHEM 8
BUN: 7 mg/dL (ref 6–20)
Calcium, Ion: 1.08 mmol/L — ABNORMAL LOW (ref 1.15–1.40)
Chloride: 99 mmol/L (ref 98–111)
Creatinine, Ser: 0.5 mg/dL (ref 0.44–1.00)
Glucose, Bld: 128 mg/dL — ABNORMAL HIGH (ref 70–99)
HCT: 43 % (ref 36.0–46.0)
Hemoglobin: 14.6 g/dL (ref 12.0–15.0)
Potassium: 3.8 mmol/L (ref 3.5–5.1)
Sodium: 137 mmol/L (ref 135–145)
TCO2: 26 mmol/L (ref 22–32)

## 2018-04-27 LAB — POCT PREGNANCY, URINE: Preg Test, Ur: NEGATIVE

## 2018-04-27 MED ORDER — ONDANSETRON HCL 4 MG/2ML IJ SOLN
INTRAMUSCULAR | Status: AC
  Filled 2018-04-27: qty 2

## 2018-04-27 MED ORDER — ONDANSETRON HCL 4 MG PO TABS: 4.0000 mg | ORAL_TABLET | Freq: Four times a day (QID) | ORAL | 0 refills | Status: DC

## 2018-04-27 MED ORDER — ONDANSETRON HCL 4 MG/2ML IJ SOLN
4.0000 mg | Freq: Once | INTRAMUSCULAR | Status: AC
  Administered 2018-04-27: 4 mg via INTRAMUSCULAR

## 2018-04-27 NOTE — ED Triage Notes (Signed)
Pt here for vomiting starting this am 

## 2018-04-27 NOTE — Discharge Instructions (Addendum)
Zofran shot given in office Urine pregnancy negative Urine showed protein in your urine.  This could be a sign of dehydrations.   Blood work showed mildly decreased calcium.  Recommend recheck within the next couple of weeks Declines abdominal x-ray today.   Get rest and drink fluids Zofran prescribed.  Take as directed.    DIET Instructions: 30 minutes after taking nausea medicine, begin with sips of clear liquids. If able to hold down 2 - 4 ounces for 30 minutes, begin drinking more. Increase your fluid intake to replace losses. Clear liquids only for 24 hours (water, tea, sport drinks, clear flat ginger ale or cola and juices, broth, jello, popsicles, ect). Advance to bland foods, applesauce, rice, baked or boiled chicken, ect. Avoid milk, greasy foods and anything that doesnt agree with you.  If you experience new or worsening symptoms return or go to ER If vomiting persists and you are unable to hold fluids or diarrhea persists more than 4 days, or becomes bloody, or if you develop high fever or abdominal pain, return here, see your doctor or go to the ER.

## 2018-04-27 NOTE — ED Provider Notes (Signed)
[facility]   [id number] 04/27/18 Arrival Time: 1353  CC: nausea, vomiting, and abdominal discomfort  SUBJECTIVE:  Lori Marshall is a 42 y.o. female hx significant for anxiety, IBS, depression, hypothyroid, and GERD who presents with complaint of vomiting that began this morning.  Patient reports nonbloody emesis with approximately 10 episodes.  Denies a precipitating event, or trauma. No close contacts with similar symptoms.  Denies eating anything unusual.  Patient also complains of intermittent abdominal discomfort that began 3 days ago.  Describes as generalized and sharp.  Has tried OTC medications without relief.  Denies alleviating or aggravating factors.  Reports similar symptoms in the past.  Last BM yesterday and with looser stools.  Complains of increased urination.    Denies fever, chills, appetite changes, weight changes, chest pain, SOB, diarrhea, constipation, hematochezia, melena, dysuria, difficulty urinating, increased urgency, flank pain, loss of bowel or bladder function.  No LMP recorded.  ROS: As per HPI.  Past Medical History:  Diagnosis Date  . Anxiety   . Depression   . Depressive disorder, not elsewhere classified   . Diarrhea   . Esophageal reflux   . Hypothyroid   . Irritable bowel syndrome   . Migraine, unspecified, without mention of intractable migraine without mention of status migrainosus   . Panic disorder without agoraphobia   . Vomiting alone    Past Surgical History:  Procedure Laterality Date  . APPENDECTOMY    . CHOLECYSTECTOMY  08/19/2011   Procedure: LAPAROSCOPIC CHOLECYSTECTOMY WITH INTRAOPERATIVE CHOLANGIOGRAM;  Surgeon: Velora Heckler, MD;  Location: [facility];  Service: General;  Laterality: N/A;  . COLON SURGERY    . WISDOM TOOTH EXTRACTION     Allergies  Allergen Reactions  . Dust Mite Extract Swelling  . Medrol [Methylprednisolone]   . Novocain [Procaine] Palpitations   No current facility-administered medications  on file prior to encounter.    Current Outpatient Medications on File Prior to Encounter  Medication Sig Dispense Refill  . acyclovir (ZOVIRAX) 800 MG tablet Take 1 tablet (800 mg total) by mouth 3 (three) times daily. 30 tablet 3  . fluticasone (FLONASE) 50 MCG/ACT nasal spray Place 2 sprays into both nostrils daily. 16 g 0  . meclizine (ANTIVERT) 25 MG tablet Take 1 tablet (25 mg total) by mouth 3 (three) times daily as needed for dizziness. 60 tablet 1  . promethazine (PHENERGAN) 25 MG tablet take 1 tablet by mouth every 6 hours if needed for nausea and vomiting 12 tablet 0   Social History   Socioeconomic History  . Marital status: Single    Spouse name: Not on file  . Number of children: Not on file  . Years of education: Not on file  . Highest education level: Not on file  Occupational History  . 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 Financial resource strain: Not on file  . Food insecurity:    Worry: Not on file    Inability: Not on file  . Transportation needs:    Medical: Not on file    Non-medical: Not on file  Tobacco Use  . Smoking status: Current Every Day Smoker    Packs/day: 0.50    Years: 5.00    Pack years: 2.50    Types: Cigarettes  . Smokeless tobacco: Never Used  Substance and Sexual Activity  . Alcohol use: No    Comment: socially  . Drug use: No  . Sexual activity: Not Currently  Birth control/protection: None  Lifestyle  . Physical activity:    Days per week: Not on file    Minutes per session: Not on file  . Stress: Not on file  Relationships  . Social connections:    Talks on phone: Not on file    Gets together: Not on file    Attends religious service: Not on file    Active member of club or organization: Not on file    Attends meetings of clubs or organizations: Not on file    Relationship status: Not on file  . Intimate partner violence:    Fear of current or ex partner: Not on file     Emotionally abused: Not on file    Physically abused: Not on file    Forced sexual activity: Not on file  Other Topics Concern  . Not on file  Social History Narrative   ** Merged History Encounter **       Patient contact person: Lenon OmsB J Parker  [phone]   Family History  Problem Relation Age of Onset  . Diabetes Father   . Melanoma Father   . Colon polyps Father   . Colon cancer Neg Hx      OBJECTIVE:  Vitals:   04/27/18 1407  BP: 136/88  Pulse: 84  Resp: 20  Temp: 98.4 F (36.9 C)  TempSrc: Oral  SpO2: 100%    General appearance: AOx3 in no acute distress; sipping ginger ale in room HEENT: NCAT.  Oropharynx clear.  Lungs: clear to auscultation bilaterally without adventitious breath sounds Heart: regular rate and rhythm.  Radial pulses 2+ symmetrical bilaterally Abdomen: soft, non-distended; normal active bowel sounds; mildly tender to palpation about the LLQ; nontender at McBurney's point; negative Murphy's sign; negative rebound; no guarding Back: no CVA tenderness Extremities: no edema; symmetrical with no gross deformities Skin: warm and dry Neurologic: normal gait Psychological: alert and cooperative; anxious mood and affect  Labs: Results for orders placed or performed during the hospital encounter of 04/27/18 (from the past 24 hour(s))  POCT urinalysis dip (device)     Status: Abnormal   Collection Time: 04/27/18  2:52 PM  Result Value Ref Range   Glucose, UA NEGATIVE NEGATIVE mg/dL   Bilirubin Urine SMALL (A) NEGATIVE   Ketones, ur 40 (A) NEGATIVE mg/dL   Specific Gravity, Urine 1.015 1.005 - 1.030   Hgb urine dipstick NEGATIVE NEGATIVE   pH 7.5 5.0 - 8.0   Protein, ur 100 (A) NEGATIVE mg/dL   Urobilinogen, UA 0.2 0.0 - 1.0 mg/dL   Nitrite NEGATIVE NEGATIVE   Leukocytes, UA NEGATIVE NEGATIVE  Pregnancy, urine POC     Status: None   Collection Time: 04/27/18  3:00 PM  Result Value Ref Range   Preg Test, Ur NEGATIVE NEGATIVE  I-STAT, chem 8      Status: Abnormal   Collection Time: 04/27/18  3:11 PM  Result Value Ref Range   Sodium 137 135 - 145 mmol/L   Potassium 3.8 3.5 - 5.1 mmol/L   Chloride 99 98 - 111 mmol/L   BUN 7 6 - 20 mg/dL   Creatinine, Ser 4.540.50 0.44 - 1.00 mg/dL   Glucose, Bld 098128 (H) 70 - 99 mg/dL   Calcium, Ion 1.191.08 (L) 1.15 - 1.40 mmol/L   TCO2 26 22 - 32 mmol/L   Hemoglobin 14.6 12.0 - 15.0 g/dL   HCT 14.743.0 82.936.0 - 56.246.0 %    ASSESSMENT & PLAN:  1. Non-intractable vomiting with nausea, unspecified  vomiting type   2. Generalized abdominal discomfort     Meds ordered this encounter  Medications  . ondansetron (ZOFRAN) injection 4 mg  . ondansetron (ZOFRAN) 4 MG tablet    Sig: Take 1 tablet (4 mg total) by mouth every 6 (six) hours.    Dispense:  12 tablet    Refill:  0    Order Specific Question:   Supervising Provider    Answer:   Isa Rankin [[id number]]    Zofran shot given in office.  Reports mild improvement with zofran. Urine pregnancy negative Urine showed protein in your urine.  This could be a sign of dehydrations.   Blood work showed mildly decreased calcium.   Declines abdominal x-ray today.   Get rest and drink fluids Zofran prescribed.  Take as directed.    DIET Instructions: 30 minutes after taking nausea medicine, begin with sips of clear liquids. If able to hold down 2 - 4 ounces for 30 minutes, begin drinking more. Increase your fluid intake to replace losses. Clear liquids only for 24 hours (water, tea, sport drinks, clear flat ginger ale or cola and juices, broth, jello, popsicles, ect). Advance to bland foods, applesauce, rice, baked or boiled chicken, ect. Avoid milk, greasy foods and anything that doesn't agree with you.  If you experience new or worsening symptoms return or go to ER If vomiting persists and you are unable to hold fluids or diarrhea persists more than 4 days, or becomes bloody, or if you develop high fever or abdominal pain, return here, see your doctor or  go to the ER.   Reviewed expectations re: course of current medical issues. Questions answered. Outlined signs and symptoms indicating need for more acute intervention. Patient verbalized understanding. After Visit Summary given.   Rennis Harding, PA-C 04/27/18 1536

## 2018-06-08 DIAGNOSIS — H5213 Myopia, bilateral: Secondary | ICD-10-CM | POA: Diagnosis not present

## 2018-06-10 DIAGNOSIS — N978 Female infertility of other origin: Secondary | ICD-10-CM | POA: Diagnosis not present

## 2018-06-10 DIAGNOSIS — E288 Other ovarian dysfunction: Secondary | ICD-10-CM | POA: Diagnosis not present

## 2018-06-10 DIAGNOSIS — N979 Female infertility, unspecified: Secondary | ICD-10-CM | POA: Diagnosis not present

## 2018-06-10 DIAGNOSIS — Z31430 Encounter of female for testing for genetic disease carrier status for procreative management: Secondary | ICD-10-CM | POA: Diagnosis not present

## 2018-06-10 DIAGNOSIS — Z319 Encounter for procreative management, unspecified: Secondary | ICD-10-CM | POA: Diagnosis not present

## 2018-06-17 DIAGNOSIS — Z319 Encounter for procreative management, unspecified: Secondary | ICD-10-CM | POA: Diagnosis not present

## 2018-06-17 DIAGNOSIS — N978 Female infertility of other origin: Secondary | ICD-10-CM | POA: Diagnosis not present

## 2018-06-17 DIAGNOSIS — N979 Female infertility, unspecified: Secondary | ICD-10-CM | POA: Diagnosis not present

## 2018-06-17 DIAGNOSIS — E288 Other ovarian dysfunction: Secondary | ICD-10-CM | POA: Diagnosis not present

## 2018-06-21 DIAGNOSIS — N978 Female infertility of other origin: Secondary | ICD-10-CM | POA: Diagnosis not present

## 2018-06-21 DIAGNOSIS — N979 Female infertility, unspecified: Secondary | ICD-10-CM | POA: Diagnosis not present

## 2018-06-21 DIAGNOSIS — Z319 Encounter for procreative management, unspecified: Secondary | ICD-10-CM | POA: Diagnosis not present

## 2018-06-21 DIAGNOSIS — E288 Other ovarian dysfunction: Secondary | ICD-10-CM | POA: Diagnosis not present

## 2018-06-22 DIAGNOSIS — Z3189 Encounter for other procreative management: Secondary | ICD-10-CM | POA: Diagnosis not present

## 2018-06-28 DIAGNOSIS — N979 Female infertility, unspecified: Secondary | ICD-10-CM | POA: Diagnosis not present

## 2018-06-28 DIAGNOSIS — Z319 Encounter for procreative management, unspecified: Secondary | ICD-10-CM | POA: Diagnosis not present

## 2018-06-28 DIAGNOSIS — N978 Female infertility of other origin: Secondary | ICD-10-CM | POA: Diagnosis not present

## 2018-06-28 DIAGNOSIS — E288 Other ovarian dysfunction: Secondary | ICD-10-CM | POA: Diagnosis not present

## 2018-07-08 ENCOUNTER — Ambulatory Visit: Payer: Self-pay | Admitting: Nurse Practitioner

## 2018-07-08 ENCOUNTER — Encounter: Payer: Self-pay | Admitting: Nurse Practitioner

## 2018-07-08 VITALS — BP 100/80 | HR 95 | Temp 98.5°F | Wt 168.0 lb

## 2018-07-08 DIAGNOSIS — J0140 Acute pansinusitis, unspecified: Secondary | ICD-10-CM

## 2018-07-08 MED ORDER — FLUTICASONE PROPIONATE 50 MCG/ACT NA SUSP: 2.0000 | Freq: Every day | NASAL | 0 refills | Status: DC

## 2018-07-08 MED ORDER — AMOXICILLIN-POT CLAVULANATE 875-125 MG PO TABS: 1.0000 | ORAL_TABLET | Freq: Two times a day (BID) | ORAL | 0 refills | Status: AC

## 2018-07-08 NOTE — Progress Notes (Signed)
Subjective:  Lori Marshall is a 42 y.o. female who presents for evaluation of possible sinusitis.  Symptoms include bilateral ear pressure/pain, headache described as dull, throbbing, nasal congestion, productive cough with yellow colored sputum, sinus pressure, sinus pain, sore throat and fatigue.  Onset of symptoms was 7 days ago, and has been gradually worsening since that time.  Treatment to date:  Mucinex, Robitussin and Zyrtec.  High risk factors for influenza complications:  none.  The following portions of the patient's history were reviewed and updated as appropriate:  allergies, current medications and past medical history.  Constitutional: positive for anorexia and fatigue, negative for chills, malaise, night sweats and sweats Eyes: negative Ears, nose, mouth, throat, and face: positive for nasal congestion, sore throat and See HPI, negative for ear drainage and earaches Respiratory: positive for cough and sputum, negative for asthma, chronic bronchitis, dyspnea on exertion, pleurisy/chest pain, pneumonia, stridor and wheezing Cardiovascular: negative Gastrointestinal: positive for decreased appetite, negative for abdominal pain, constipation, nausea and vomiting Neurological: positive for headaches, negative for coordination problems, dizziness, paresthesia, speech problems, vertigo and weakness Allergic/Immunologic: positive for hay fever Objective:  BP 100/80 (BP Location: Right Arm, Patient Position: Sitting)   Pulse 95   Temp 98.5 F (36.9 C)   Wt 168 lb (76.2 kg)   SpO2 98%   BMI 27.12 kg/m  General appearance: alert, cooperative, fatigued and no distress Head: Normocephalic, without obvious abnormality, atraumatic Eyes: conjunctivae/corneas clear. PERRL, EOM's intact. Fundi benign. Ears: normal TM and external ear canal right ear and abnormal TM left ear - mucoid middle ear fluid Nose: no discharge, moderate congestion, turbinates swollen, inflamed, moderate maxillary  sinus tenderness bilateral, moderate frontal sinus tenderness bilateral Throat: abnormal findings: mild oropharyngeal erythema and tonsils 0 bilaterally, no exudate Lungs: clear to auscultation bilaterally Heart: regular rate and rhythm, S1, S2 normal, no murmur, click, rub or gallop Abdomen: soft, non-tender; bowel sounds normal; no masses,  no organomegaly Pulses: 2+ and symmetric Skin: Skin color, texture, turgor normal. No rashes or lesions Lymph nodes: cervical and submandibular nodes normal Neurologic: Grossly normal    Assessment:  Acute Pansinusitis    Plan:   Exam findings, diagnosis etiology and medication use and indications reviewed with patient. Follow- Up and discharge instructions provided. No emergent/urgent issues found on exam. Patient education was provided. Patient verbalized understanding of information provided and agrees with plan of care (POC), all questions answered. The patient is advised to call or return to clinic if condition does not see an improvement in symptoms, or to seek the care of the closest emergency department if condition worsens with the above plan.   1. Acute pansinusitis, recurrence not specified  - amoxicillin-clavulanate (AUGMENTIN) 875-125 MG tablet; Take 1 tablet by mouth 2 (two) times daily for 10 days.  Dispense: 20 tablet; Refill: 0 - fluticasone (FLONASE) 50 MCG/ACT nasal spray; Place 2 sprays into both nostrils daily for 10 days.  Dispense: 16 g; Refill: 0 -Take medications as prescribed. -Ibuprofen or Tylenol for pain, fever, or general discomfort. -Purchase over-the-counter Mucinex plain for your cough. -Increase fluids. -Sleep elevated on 2 pillows at bedtime. -Use humidifier or vaporizer when at home and during sleep. -May use a teaspoon of honey as needed for cough.  May also use cough drops for cough. -Follow-up if no improvement with the next 3 to 5 days.

## 2018-07-08 NOTE — Patient Instructions (Addendum)
Sinusitis, Adult -Take medications as prescribed. -Ibuprofen or Tylenol for pain, fever, or general discomfort. -Purchase over-the-counter Mucinex plain for your cough. -Increase fluids. -Sleep elevated on 2 pillows at bedtime. -Use humidifier or vaporizer when at home and during sleep. -May use a teaspoon of honey as needed for cough.  May also use cough drops for cough. -Follow-up if no improvement with the next 3 to 5 days.   Sinusitis is soreness and inflammation of your sinuses. Sinuses are hollow spaces in the bones around your face. Your sinuses are located:  Around your eyes.  In the middle of your forehead.  Behind your nose.  In your cheekbones.  Your sinuses and nasal passages are lined with a stringy fluid (mucus). Mucus normally drains out of your sinuses. When your nasal tissues become inflamed or swollen, the mucus can become trapped or blocked so air cannot flow through your sinuses. This allows bacteria, viruses, and funguses to grow, which leads to infection. Sinusitis can develop quickly and last for 7?10 days (acute) or for more than 12 weeks (chronic). Sinusitis often develops after a cold. What are the causes? This condition is caused by anything that creates swelling in the sinuses or stops mucus from draining, including:  Allergies.  Asthma.  Bacterial or viral infection.  Abnormally shaped bones between the nasal passages.  Nasal growths that contain mucus (nasal polyps).  Narrow sinus openings.  Pollutants, such as chemicals or irritants in the air.  A foreign object stuck in the nose.  A fungal infection. This is rare.  What increases the risk? The following factors may make you more likely to develop this condition:  Having allergies or asthma.  Having had a recent cold or respiratory tract infection.  Having structural deformities or blockages in your nose or sinuses.  Having a weak immune system.  Doing a lot of swimming or  diving.  Overusing nasal sprays.  Smoking.  What are the signs or symptoms? The main symptoms of this condition are pain and a feeling of pressure around the affected sinuses. Other symptoms include:  Upper toothache.  Earache.  Headache.  Bad breath.  Decreased sense of smell and taste.  A cough that may get worse at night.  Fatigue.  Fever.  Thick drainage from your nose. The drainage is often green and it may contain pus (purulent).  Stuffy nose or congestion.  Postnasal drip. This is when extra mucus collects in the throat or back of the nose.  Swelling and warmth over the affected sinuses.  Sore throat.  Sensitivity to light.  How is this diagnosed? This condition is diagnosed based on symptoms, a medical history, and a physical exam. To find out if your condition is acute or chronic, your health care provider may:  Look in your nose for signs of nasal polyps.  Tap over the affected sinus to check for signs of infection.  View the inside of your sinuses using an imaging device that has a light attached (endoscope).  If your health care provider suspects that you have chronic sinusitis, you may also:  Be tested for allergies.  Have a sample of mucus taken from your nose (nasal culture) and checked for bacteria.  Have a mucus sample examined to see if your sinusitis is related to an allergy.  If your sinusitis does not respond to treatment and it lasts longer than 8 weeks, you may have an MRI or CT scan to check your sinuses. These scans also help to determine  how severe your infection is. In rare cases, a bone biopsy may be done to rule out more serious types of fungal sinus disease. How is this treated? Treatment for sinusitis depends on the cause and whether your condition is chronic or acute. If a virus is causing your sinusitis, your symptoms will go away on their own within 10 days. You may be given medicines to relieve your symptoms,  including:  Topical nasal decongestants. They shrink swollen nasal passages and let mucus drain from your sinuses.  Antihistamines. These drugs block inflammation that is triggered by allergies. This can help to ease swelling in your nose and sinuses.  Topical nasal corticosteroids. These are nasal sprays that ease inflammation and swelling in your nose and sinuses.  Nasal saline washes. These rinses can help to get rid of thick mucus in your nose.  If your condition is caused by bacteria, you will be given an antibiotic medicine. If your condition is caused by a fungus, you will be given an antifungal medicine. Surgery may be needed to correct underlying conditions, such as narrow nasal passages. Surgery may also be needed to remove polyps. Follow these instructions at home: Medicines  Take, use, or apply over-the-counter and prescription medicines only as told by your health care provider. These may include nasal sprays.  If you were prescribed an antibiotic medicine, take it as told by your health care provider. Do not stop taking the antibiotic even if you start to feel better. Hydrate and Humidify  Drink enough water to keep your urine clear or pale yellow. Staying hydrated will help to thin your mucus.  Use a cool mist humidifier to keep the humidity level in your home above 50%.  Inhale steam for 10-15 minutes, 3-4 times a day or as told by your health care provider. You can do this in the bathroom while a hot shower is running.  Limit your exposure to cool or dry air. Rest  Rest as much as possible.  Sleep with your head raised (elevated).  Make sure to get enough sleep each night. General instructions  Apply a warm, moist washcloth to your face 3-4 times a day or as told by your health care provider. This will help with discomfort.  Wash your hands often with soap and water to reduce your exposure to viruses and other germs. If soap and water are not available, use hand  sanitizer.  Do not smoke. Avoid being around people who are smoking (secondhand smoke).  Keep all follow-up visits as told by your health care provider. This is important. Contact a health care provider if:  You have a fever.  Your symptoms get worse.  Your symptoms do not improve within 10 days. Get help right away if:  You have a severe headache.  You have persistent vomiting.  You have pain or swelling around your face or eyes.  You have vision problems.  You develop confusion.  Your neck is stiff.  You have trouble breathing. This information is not intended to replace advice given to you by your health care provider. Make sure you discuss any questions you have with your health care provider. Document Released: 08/18/2005 Document Revised: 04/13/2016 Document Reviewed: 06/13/2015 Elsevier Interactive Patient Education  [employer].

## 2018-07-16 DIAGNOSIS — Z3189 Encounter for other procreative management: Secondary | ICD-10-CM | POA: Diagnosis not present

## 2018-07-20 DIAGNOSIS — Z3189 Encounter for other procreative management: Secondary | ICD-10-CM | POA: Diagnosis not present

## 2018-07-20 DIAGNOSIS — Z319 Encounter for procreative management, unspecified: Secondary | ICD-10-CM | POA: Diagnosis not present

## 2018-07-20 DIAGNOSIS — N979 Female infertility, unspecified: Secondary | ICD-10-CM | POA: Diagnosis not present

## 2018-07-20 DIAGNOSIS — Z31430 Encounter of female for testing for genetic disease carrier status for procreative management: Secondary | ICD-10-CM | POA: Diagnosis not present

## 2018-07-20 DIAGNOSIS — N978 Female infertility of other origin: Secondary | ICD-10-CM | POA: Diagnosis not present

## 2018-08-04 DIAGNOSIS — Z1389 Encounter for screening for other disorder: Secondary | ICD-10-CM | POA: Diagnosis not present

## 2018-08-04 DIAGNOSIS — E7849 Other hyperlipidemia: Secondary | ICD-10-CM | POA: Diagnosis not present

## 2018-08-04 DIAGNOSIS — G4709 Other insomnia: Secondary | ICD-10-CM | POA: Diagnosis not present

## 2018-08-04 DIAGNOSIS — K5909 Other constipation: Secondary | ICD-10-CM | POA: Diagnosis not present

## 2018-08-04 DIAGNOSIS — R82998 Other abnormal findings in urine: Secondary | ICD-10-CM | POA: Diagnosis not present

## 2018-08-04 DIAGNOSIS — F419 Anxiety disorder, unspecified: Secondary | ICD-10-CM | POA: Diagnosis not present

## 2018-08-04 DIAGNOSIS — Z0000 Encounter for general adult medical examination without abnormal findings: Secondary | ICD-10-CM | POA: Diagnosis not present

## 2018-08-04 DIAGNOSIS — F418 Other specified anxiety disorders: Secondary | ICD-10-CM | POA: Diagnosis not present

## 2018-08-17 DIAGNOSIS — Z319 Encounter for procreative management, unspecified: Secondary | ICD-10-CM | POA: Diagnosis not present

## 2018-08-17 DIAGNOSIS — N83291 Other ovarian cyst, right side: Secondary | ICD-10-CM | POA: Diagnosis not present

## 2018-08-19 ENCOUNTER — Ambulatory Visit (INDEPENDENT_AMBULATORY_CARE_PROVIDER_SITE_OTHER): Payer: BLUE CROSS/BLUE SHIELD | Admitting: Family Medicine

## 2018-08-19 ENCOUNTER — Encounter (INDEPENDENT_AMBULATORY_CARE_PROVIDER_SITE_OTHER): Payer: Self-pay | Admitting: Family Medicine

## 2018-08-19 ENCOUNTER — Ambulatory Visit (INDEPENDENT_AMBULATORY_CARE_PROVIDER_SITE_OTHER): Payer: Self-pay

## 2018-08-19 DIAGNOSIS — M25511 Pain in right shoulder: Secondary | ICD-10-CM | POA: Diagnosis not present

## 2018-08-19 MED ORDER — HYDROCODONE-ACETAMINOPHEN 5-325 MG PO TABS: 1.0000 | ORAL_TABLET | Freq: Every evening | ORAL | 0 refills | Status: DC | PRN

## 2018-08-19 MED ORDER — METHYLPREDNISOLONE ACETATE 40 MG/ML IJ SUSP: 40.0000 mg | Freq: Once | INTRAMUSCULAR | Status: DC

## 2018-08-19 MED ORDER — PROMETHAZINE HCL 25 MG PO TABS: 25.0000 mg | ORAL_TABLET | Freq: Four times a day (QID) | ORAL | 1 refills | Status: DC | PRN

## 2018-08-19 NOTE — Progress Notes (Signed)
Office Visit Note   Patient: Lori Marshall           Date of Birth: 07-Mar-1976           MRN: [id number] Visit Date: 08/19/2018 Requested by: No referring provider defined for this encounter. PCP: Patient, No Pcp Per  Subjective: Chief Complaint  Patient presents with  . Right Shoulder - Pain    Pain x 5 weeks.  Wakes her up at night. First notice after reaching and lifting Christmas decorations to put them back in the attic. Popping and cracking in the shoulder when she moves it.    HPI: She is a 42 year old right-hand-dominant female with right shoulder pain.  She is a friend of [employer] [place].  About 5 weeks ago she recalls putting boxes up into the attic.  She does not remember any pain doing it, but the next day she recalls waking up feeling pain in her shoulder and knee that is continued to bother her since then.  It pops when she moves it, and now she cannot sleep at night.  She tried ibuprofen with minimal improvement.  No previous problems with her shoulder.              ROS: She has a history of anxiety and depression along with GERD.  Other systems were reviewed and are negative.  Objective: Vital Signs: There were no vitals taken for this visit.  Physical Exam:  Right shoulder: Full active range of motion but palpable and audible popping with circumduction.  [facility].  No pain at the biceps tendon.  Rotator cuff strength is 5/5 throughout but with pain on empty can test.  Imaging: None today.  Assessment & Plan: 1.  Right shoulder pain, suspect impingement -Discussed options with patient, she has done well with cortisone injections in other joints in the past and she would like one today.  X-rays if symptoms persist.   Follow-Up Instructions: No follow-ups on file.      Procedures:  Right shoulder subacromial injection: After sterile prep with Betadine, injected 5 cc 1% lidocaine without epinephrine and 40 mg methylprednisolone from  posterior approach into the subacromial space.  PMFS History: Patient Active Problem List   Diagnosis Date Noted  . Alcohol abuse 08/12/2013  . Depression 08/11/2013  . Clostridium difficile colitis 08/09/2013  . Anxiety   . GERD 05/17/2010  . Irritable bowel syndrome 05/17/2010   Past Medical History:  Diagnosis Date  . Anxiety   . Depression   . Depressive disorder, not elsewhere classified   . Diarrhea   . Esophageal reflux   . Hypothyroid   . Irritable bowel syndrome   . Migraine, unspecified, without mention of intractable migraine without mention of status migrainosus   . Panic disorder without agoraphobia   . Vomiting alone     Family History  Problem Relation Age of Onset  . Diabetes Father   . Melanoma Father   . Colon polyps Father   . Colon cancer Neg Hx     Past Surgical History:  Procedure Laterality Date  . APPENDECTOMY    . CHOLECYSTECTOMY  08/19/2011   Procedure: LAPAROSCOPIC CHOLECYSTECTOMY WITH INTRAOPERATIVE CHOLANGIOGRAM;  Surgeon: Velora Hecklerodd M Gerkin, MD;  Location: [facility];  Service: General;  Laterality: N/A;  . COLON SURGERY    . WISDOM TOOTH EXTRACTION     Social History   Occupational History  . Occupation: Landscape architectnsurance Agent    Employer: [employer]  Tobacco  Use  . Smoking status: Current Every Day Smoker    Packs/day: 0.50    Years: 5.00    Pack years: 2.50    Types: Cigarettes  . Smokeless tobacco: Never Used  Substance and Sexual Activity  . Alcohol use: No    Comment: socially  . Drug use: No  . Sexual activity: Not Currently    Birth control/protection: None

## 2018-08-20 ENCOUNTER — Telehealth (INDEPENDENT_AMBULATORY_CARE_PROVIDER_SITE_OTHER): Payer: Self-pay | Admitting: Family Medicine

## 2018-08-20 ENCOUNTER — Other Ambulatory Visit (INDEPENDENT_AMBULATORY_CARE_PROVIDER_SITE_OTHER): Payer: Self-pay | Admitting: Family Medicine

## 2018-08-20 MED ORDER — TRAMADOL HCL 50 MG PO TABS: 50.0000 mg | ORAL_TABLET | Freq: Four times a day (QID) | ORAL | 0 refills | Status: DC | PRN

## 2018-08-20 NOTE — Telephone Encounter (Signed)
I spoke with the patient to let her know this should subside in a couple days.  She has tried the Hydrocodone, taking 1 at a time - has taken #4 as of today, but still not sleeping. She did not realize she could take 2 at a time. This was mainly for her to take at bedtime. She said she has been home yesterday and today so she felt safe taking it during the day for her pain.  A new Rx for Tramadol has been sent in to her pharmacy today by Dr. Prince RomeHilts, so she will not run out of medicine if she needs it over the weekend. I explained the Tramadol was a step down from the hydrocodone - she is agreeable to this.  She will call if symptoms do not improve.

## 2018-08-20 NOTE — Telephone Encounter (Signed)
This is not uncommon.  It will typically subside after a couple days, but lots of ice can help.  I believe I called in hydrocodone for her yesterday.

## 2018-08-20 NOTE — Telephone Encounter (Signed)
Please advise 

## 2018-08-20 NOTE — Telephone Encounter (Signed)
Patient called advised she had the injection yesterday and can't lift her arm. Patient said she did not sleep at all last night. Patient want to know what to do because the pain is really bad. The number to contact patient is [phone]

## 2018-08-26 DIAGNOSIS — Z3189 Encounter for other procreative management: Secondary | ICD-10-CM | POA: Diagnosis not present

## 2018-08-29 ENCOUNTER — Ambulatory Visit (INDEPENDENT_AMBULATORY_CARE_PROVIDER_SITE_OTHER): Payer: BLUE CROSS/BLUE SHIELD

## 2018-08-29 ENCOUNTER — Ambulatory Visit (HOSPITAL_COMMUNITY)
Admission: EM | Admit: 2018-08-29 | Discharge: 2018-08-29 | Disposition: A | Discharge status: Home / Self Care | Payer: BLUE CROSS/BLUE SHIELD | Attending: Family Medicine | Admitting: Family Medicine

## 2018-08-29 ENCOUNTER — Other Ambulatory Visit: Payer: Self-pay

## 2018-08-29 ENCOUNTER — Encounter (HOSPITAL_COMMUNITY): Payer: Self-pay | Admitting: Emergency Medicine

## 2018-08-29 DIAGNOSIS — M25552 Pain in left hip: Secondary | ICD-10-CM

## 2018-08-29 DIAGNOSIS — S76212A Strain of adductor muscle, fascia and tendon of left thigh, initial encounter: Secondary | ICD-10-CM

## 2018-08-29 DIAGNOSIS — S335XXA Sprain of ligaments of lumbar spine, initial encounter: Secondary | ICD-10-CM | POA: Insufficient documentation

## 2018-08-29 DIAGNOSIS — W19XXXA Unspecified fall, initial encounter: Secondary | ICD-10-CM

## 2018-08-29 DIAGNOSIS — S339XXA Sprain of unspecified parts of lumbar spine and pelvis, initial encounter: Secondary | ICD-10-CM

## 2018-08-29 DIAGNOSIS — S79912A Unspecified injury of left hip, initial encounter: Secondary | ICD-10-CM | POA: Diagnosis not present

## 2018-08-29 MED ORDER — HYDROCODONE-ACETAMINOPHEN 5-325 MG PO TABS: 2.0000 | ORAL_TABLET | ORAL | 0 refills | Status: DC | PRN

## 2018-08-29 MED ORDER — PROMETHAZINE HCL 25 MG PO TABS: 25.0000 mg | ORAL_TABLET | Freq: Four times a day (QID) | ORAL | 0 refills | Status: DC | PRN

## 2018-08-29 MED ORDER — ACYCLOVIR 400 MG PO TABS: 400.0000 mg | ORAL_TABLET | Freq: Four times a day (QID) | ORAL | 0 refills | Status: DC

## 2018-08-29 NOTE — Discharge Instructions (Signed)
Ice to areas of pain.  See Dr. Prince RomeHilts for recheck.  Call tomorrow for appointment

## 2018-08-29 NOTE — ED Triage Notes (Signed)
[facility] a [facility] of left hip pain secondary to a fall from approximately 5 feet three days ago.

## 2018-08-30 NOTE — ED Provider Notes (Signed)
[facility]    CSN: [id number] Arrival date & time: 08/29/18  1430     History   Chief Complaint Chief Complaint  Patient presents with  . Hip Pain    HPI Lori Marshall is a 42 y.o. female.   Pt fell and leg was   The history is provided by the patient. No language interpreter was used.  Hip Pain  This is a new problem. The current episode started less than 1 hour ago. The problem occurs constantly. The problem has been gradually worsening. Pertinent negatives include no headaches and no shortness of breath. Nothing aggravates the symptoms. Nothing relieves the symptoms. She has tried nothing for the symptoms. The treatment provided no relief.    Past Medical History:  Diagnosis Date  . Anxiety   . Depression   . Depressive disorder, not elsewhere classified   . Diarrhea   . Esophageal reflux   . Hypothyroid   . Irritable bowel syndrome   . Migraine, unspecified, without mention of intractable migraine without mention of status migrainosus   . Panic disorder without agoraphobia   . Vomiting alone     Patient Active Problem List   Diagnosis Date Noted  . Alcohol abuse 08/12/2013  . Depression 08/11/2013  . Clostridium difficile colitis 08/09/2013  . Anxiety   . GERD 05/17/2010  . Irritable bowel syndrome 05/17/2010    Past Surgical History:  Procedure Laterality Date  . APPENDECTOMY    . CHOLECYSTECTOMY  08/19/2011   Procedure: LAPAROSCOPIC CHOLECYSTECTOMY WITH INTRAOPERATIVE CHOLANGIOGRAM;  Surgeon: Velora Hecklerodd M Gerkin, MD;  Location: [facility];  Service: General;  Laterality: N/A;  . COLON SURGERY    . WISDOM TOOTH EXTRACTION      OB History    Gravida  3   Para      Term      Preterm      AB  3   Living  0     SAB  2   TAB  1   Ectopic      Multiple      Live Births               Home Medications    Prior to Admission medications   Medication Sig Start Date End Date Taking? Authorizing Provider  cetirizine (ZYRTEC)  10 MG tablet Take 10 mg by mouth daily.   Yes [provider]  omeprazole (PRILOSEC) 40 MG capsule Take 40 mg by mouth daily.   Yes [provider]  acyclovir (ZOVIRAX) 400 MG tablet Take 1 tablet (400 mg total) by mouth 4 (four) times daily. 08/29/18   Elson AreasSofia,  K, PA-C  HYDROcodone-acetaminophen (NORCO/VICODIN) 5-325 MG tablet Take 2 tablets by mouth every 4 (four) hours as needed. 08/29/18   Elson AreasSofia,  K, PA-C  letrozole [facility](FEMARA) 2.5 MG tablet TK 1 T PO QD 06/09/18   [provider]  meclizine (ANTIVERT) 25 MG tablet Take 1 tablet (25 mg total) by mouth 3 (three) times daily as needed for dizziness. Patient not taking: Reported on 07/08/2018 08/21/16   Shirleen Schirmerllis, Courtney, PA-C  OVIDREL 250 MCG/0.5ML injection INJECT AS DIRECTED BY DOCTOR 06/15/18   [provider]  promethazine (PHENERGAN) 25 MG tablet Take 1 tablet (25 mg total) by mouth every 6 (six) hours as needed for nausea or vomiting. 08/29/18   Elson AreasSofia,  K, PA-C    Family History Family History  Problem Relation Age of Onset  . Diabetes Father   . Melanoma  Father   . Colon polyps Father   . Colon cancer Neg Hx     Social History Social History   Tobacco Use  . Smoking status: Current Every Day Smoker    Packs/day: 0.50    Years: 5.00    Pack years: 2.50    Types: Cigarettes  . Smokeless tobacco: Never Used  Substance Use Topics  . Alcohol use: No    Comment: socially  . Drug use: No     Allergies   Dust mite extract; Medrol [methylprednisolone]; and Novocain [procaine]   Review of Systems Review of Systems  Respiratory: Negative for shortness of breath.   Neurological: Negative for headaches.  All other systems reviewed and are negative.    Physical Exam Triage Vital Signs ED Triage Vitals  Enc Vitals Group     BP 08/29/18 1546 (!) 147/97     Pulse Rate 08/29/18 1546 80     Resp 08/29/18 1546 18     Temp 08/29/18 1546 98.4 F (36.9 C)     Temp Source  08/29/18 1546 Oral     SpO2 08/29/18 1546 100 %     Weight --      Height --      Head Circumference --      Peak Flow --      Pain Score 08/29/18 1548 8     Pain Loc --      Pain Edu? --      Excl. in GC? --    No data found.  Updated Vital Signs BP (!) 147/97 (BP Location: Right Arm)   Pulse 80   Temp 98.4 F (36.9 C) (Oral)   Resp 18   LMP 07/29/2018   SpO2 100%   Visual Acuity Right Eye Distance:   Left Eye Distance:   Bilateral Distance:    Right Eye Near:   Left Eye Near:    Bilateral Near:     Physical Exam Vitals signs and nursing note reviewed.  Constitutional:      Appearance: She is well-developed.  HENT:     Head: Normocephalic.  Neck:     Musculoskeletal: Normal range of motion.  Cardiovascular:     Rate and Rhythm: Normal rate.  Pulmonary:     Effort: Pulmonary effort is normal.  Abdominal:     General: There is no distension.  Musculoskeletal: Normal range of motion.     Comments: Tender low back in sacral area.  Tender posterior leg in hamstring area   Skin:    General: Skin is warm.  Neurological:     Mental Status: She is alert and oriented to person, place, and time.  Psychiatric:        Mood and Affect: Mood normal.      UC Treatments / Results  Labs (all labs ordered are listed, but only abnormal results are displayed) Labs Reviewed - No data to display  EKG None  Radiology Dg Hip Unilat W Or Wo Pelvis 2-3 Views Left  Result Date: 08/29/2018 CLINICAL DATA:  42 year old who fell recently and injured the LEFT hip. Persistent pain. EXAM: DG HIP (WITH OR WITHOUT PELVIS) 2-3V LEFT COMPARISON:  Bone window images from CT abdomen and pelvis 02/10/2013. FINDINGS: No evidence of acute or subacute fracture or dislocation. Well-preserved joint space. Very small calcification adjacent to the LEFT acetabular rim. Well-preserved bone mineral density. Included AP pelvis demonstrates a symmetric normal-appearing RIGHT hip joint. Symmetric very  small calcification adjacent to the RIGHT acetabular  rim. Mixed sclerosis and lucency involving the RIGHT femoral neck, unchanged since the prior CT. No other intrinsic osseous abnormalities. Sacroiliac joints and symphysis pubis intact without degenerative changes. Visualized LOWER lumbar spine unremarkable. IMPRESSION: 1. No acute or subacute osseous abnormality. 2. Mixed sclerosis and lucency involving the RIGHT femoral neck, unchanged since the prior CT from August, 2017, therefore benign. 3. Very small calcifications adjacent to both the LEFT and RIGHT acetabular rim, either labral calcifications or an os acetabuli. These are typically asymptomatic but can occasionally be seen in patients with femoroacetabular impingement. Electronically Signed   By: Hulan Saas M.D.   On: 08/29/2018 16:43    Procedures Procedures (including critical care time)  Medications Ordered in UC Medications - No data to display  Initial Impression / Assessment and Plan / UC Course  I have reviewed the triage vital signs and the nursing notes.  Pertinent labs & imaging results that were available during my care of the patient were reviewed by me and considered in my medical decision making (see chart for details).     Pt also request acyclovir for a cold sore.  Pt have had in the past.  Final Clinical Impressions(s) / UC Diagnoses   Final diagnoses:  Groin strain, left, initial encounter  Sprain and strain of lumbosacral joint/ligament, initial encounter     Discharge Instructions     Ice to areas of pain.  See Dr. Prince Rome for recheck.  Call tomorrow for appointment   ED Prescriptions    Medication Sig Dispense Auth. Provider   HYDROcodone-acetaminophen (NORCO/VICODIN) 5-325 MG tablet Take 2 tablets by mouth every 4 (four) hours as needed. 16 tablet ,  K, [place]   promethazine (PHENERGAN) 25 MG tablet Take 1 tablet (25 mg total) by mouth every 6 (six) hours as needed for nausea or vomiting.  30 tablet ,  K, [place]   acyclovir (ZOVIRAX) 400 MG tablet Take 1 tablet (400 mg total) by mouth 4 (four) times daily. 50 tablet Elson Areas, [place]     Controlled Substance Prescriptions [place] Controlled Substance Registry consulted? Not Applicable  An After Visit Summary was printed and given to the patient.    Elson Areas, [place] 08/30/18 2243

## 2018-09-02 ENCOUNTER — Encounter (INDEPENDENT_AMBULATORY_CARE_PROVIDER_SITE_OTHER): Payer: Self-pay | Admitting: Family Medicine

## 2018-09-02 ENCOUNTER — Ambulatory Visit (INDEPENDENT_AMBULATORY_CARE_PROVIDER_SITE_OTHER): Payer: BLUE CROSS/BLUE SHIELD | Admitting: Family Medicine

## 2018-09-02 DIAGNOSIS — M25552 Pain in left hip: Secondary | ICD-10-CM | POA: Diagnosis not present

## 2018-09-02 MED ORDER — HYDROCODONE-ACETAMINOPHEN 5-325 MG PO TABS: 1.0000 | ORAL_TABLET | Freq: Two times a day (BID) | ORAL | 0 refills | Status: DC | PRN

## 2018-09-02 MED ORDER — DIAZEPAM 5 MG PO TABS: ORAL_TABLET | ORAL | 0 refills | Status: DC

## 2018-09-02 MED ORDER — PROMETHAZINE HCL 25 MG PO TABS: 25.0000 mg | ORAL_TABLET | Freq: Four times a day (QID) | ORAL | 1 refills | Status: DC | PRN

## 2018-09-02 NOTE — Progress Notes (Signed)
Office Visit Note   Patient: Lori Marshall           Date of Birth: 12/30/1975           MRN: [id number] Visit Date: 09/02/2018 Requested by: No referring provider defined for this encounter. PCP: Patient, No Pcp Per  Subjective: Chief Complaint  Patient presents with  . Left Hip - Pain    Pain deep in the left buttock, down posterior thigh & back up into groin - DOI 08/28/18. Leg slipped while trying to push down on trash can with right leg (left leg was planted). NWB w/crutches.    HPI: She is a 43 year old with left hip pain.  Symptoms started December 28.  She was attempting to push down trash inside of the trash can using her right leg.  The trashcan was at the same level as her deck.  Her left leg slipped and she fell with her right leg going down and her left leg being forced upward toward her head.  She felt and heard something pop and has had immediate severe pain in her hip.  Unable to bear full weight since the injury.  She went to the ER where x-rays were negative for fracture.  I reviewed those this morning.  She is using crutches, still unable to push down with her left foot and unable to sit directly on her left buttock area.  She has pain in the posterior hip with radiation into the groin area.  Incidentally her shoulder is doing well after injection.                ROS: Noncontributory  Objective: Vital Signs: There were no vitals taken for this visit.  Physical Exam:  Left hip: Female chaperone was present.  There is no bruising in the posterior thigh.  Exquisite tenderness in the gluteus medius area around to the greater trochanter.  Pain with flexion of the knee against resistance, extension of the hip against resistance, and ad duction of the hip against resistance.  Pain with passive internal and external hip rotation as well.  Imaging: None today  Assessment & Plan: 1.  Acute severe left hip pain concerning for tendon tear, possibly gluteus medius,  versus occult fracture -We will proceed with MRI scan.  Pain medicine refilled.   Follow-Up Instructions: No follow-ups on file.      Procedures: No procedures performed  No notes on file    PMFS History: Patient Active Problem List   Diagnosis Date Noted  . Alcohol abuse 08/12/2013  . Depression 08/11/2013  . Clostridium difficile colitis 08/09/2013  . Anxiety   . GERD 05/17/2010  . Irritable bowel syndrome 05/17/2010   Past Medical History:  Diagnosis Date  . Anxiety   . Depression   . Depressive disorder, not elsewhere classified   . Diarrhea   . Esophageal reflux   . Hypothyroid   . Irritable bowel syndrome   . Migraine, unspecified, without mention of intractable migraine without mention of status migrainosus   . Panic disorder without agoraphobia   . Vomiting alone     Family History  Problem Relation Age of Onset  . Diabetes Father   . Melanoma Father   . Colon polyps Father   . Colon cancer Neg Hx     Past Surgical History:  Procedure Laterality Date  . APPENDECTOMY    . CHOLECYSTECTOMY  08/19/2011   Procedure: LAPAROSCOPIC CHOLECYSTECTOMY WITH INTRAOPERATIVE CHOLANGIOGRAM;  Surgeon: Velora Heckler, MD;  Location: [facility];  Service: General;  Laterality: N/A;  . COLON SURGERY    . WISDOM TOOTH EXTRACTION     Social History   Occupational History  . Occupation: Landscape architectnsurance Agent    Employer: [employer]  Tobacco Use  . Smoking status: Current Every Day Smoker    Packs/day: 0.50    Years: 5.00    Pack years: 2.50    Types: Cigarettes  . Smokeless tobacco: Never Used  Substance and Sexual Activity  . Alcohol use: No    Comment: socially  . Drug use: No  . Sexual activity: Not Currently    Birth control/protection: None

## 2018-09-08 ENCOUNTER — Ambulatory Visit
Admission: RE | Admit: 2018-09-08 | Discharge: 2018-09-08 | Disposition: A | Discharge status: Home / Self Care | Payer: BLUE CROSS/BLUE SHIELD | Source: Ambulatory Visit | Attending: Family Medicine | Admitting: Family Medicine

## 2018-09-08 DIAGNOSIS — S73192A Other sprain of left hip, initial encounter: Secondary | ICD-10-CM | POA: Diagnosis not present

## 2018-09-08 DIAGNOSIS — M25552 Pain in left hip: Secondary | ICD-10-CM

## 2018-09-09 ENCOUNTER — Telehealth (INDEPENDENT_AMBULATORY_CARE_PROVIDER_SITE_OTHER): Payer: Self-pay | Admitting: Family Medicine

## 2018-09-09 DIAGNOSIS — M25552 Pain in left hip: Secondary | ICD-10-CM

## 2018-09-09 DIAGNOSIS — G4700 Insomnia, unspecified: Secondary | ICD-10-CM | POA: Diagnosis not present

## 2018-09-09 DIAGNOSIS — F419 Anxiety disorder, unspecified: Secondary | ICD-10-CM | POA: Diagnosis not present

## 2018-09-09 NOTE — Telephone Encounter (Signed)
MRI shows:  Partial tear of left hamstring tendon.  Partial tear of left quadriceps muscle.  Both should heal without surgery, and would most likely heal faster with physical therapy.  Both hips have tears of labrum cartilages.  These sometimes require surgery, but don't always do well with surgery.  Would suggest trying physical therapy first, and if symptoms don't improve, then referral to Dr. Caswell Corwin in [facility] for possible surgery.  Cortisone injection could be considered first if desired.  Ovarian cyst needs no further treatment or testing.

## 2018-09-09 NOTE — Telephone Encounter (Signed)
Patient called stating that she tried to return to work yesterday (09-08-18) and was not able to stay because of the pain and wanted to speak with you or Dr. Prince Rome about what she should do.  She did see on MyChart that he referred her to PT.  CB#[phone].  Thank you.

## 2018-09-10 ENCOUNTER — Encounter (INDEPENDENT_AMBULATORY_CARE_PROVIDER_SITE_OTHER): Payer: Self-pay | Admitting: Family Medicine

## 2018-09-10 MED ORDER — HYDROCODONE-ACETAMINOPHEN 5-325 MG PO TABS: 1.0000 | ORAL_TABLET | Freq: Two times a day (BID) | ORAL | 0 refills | Status: DC | PRN

## 2018-09-10 MED ORDER — BACLOFEN 10 MG PO TABS: 10.0000 mg | ORAL_TABLET | Freq: Three times a day (TID) | ORAL | 3 refills | Status: DC | PRN

## 2018-09-10 NOTE — Addendum Note (Signed)
Addended by: Lillia Carmel on: 09/10/2018 06:27 AM   Modules accepted: Orders

## 2018-09-10 NOTE — Telephone Encounter (Signed)
Please see the 3rd paragraph of the patient's message and advise.

## 2018-09-10 NOTE — Telephone Encounter (Signed)
I called patient to let her know Dr. Prince Rome has left her another message in MyChart (it shows she has not read it yet) - voice mail was full, so I was unable to leave a message.

## 2018-09-10 NOTE — Addendum Note (Signed)
Addended by: Lillia Carmel on: 09/10/2018 09:03 AM   Modules accepted: Orders

## 2018-09-10 NOTE — Telephone Encounter (Signed)
This has now been handled through MyChart since my last documentation.

## 2018-09-13 DIAGNOSIS — M79605 Pain in left leg: Secondary | ICD-10-CM | POA: Diagnosis not present

## 2018-09-13 DIAGNOSIS — M6281 Muscle weakness (generalized): Secondary | ICD-10-CM | POA: Diagnosis not present

## 2018-09-13 DIAGNOSIS — M25552 Pain in left hip: Secondary | ICD-10-CM | POA: Diagnosis not present

## 2018-09-13 DIAGNOSIS — R262 Difficulty in walking, not elsewhere classified: Secondary | ICD-10-CM | POA: Diagnosis not present

## 2018-09-17 DIAGNOSIS — M79605 Pain in left leg: Secondary | ICD-10-CM | POA: Diagnosis not present

## 2018-09-17 DIAGNOSIS — M25552 Pain in left hip: Secondary | ICD-10-CM | POA: Diagnosis not present

## 2018-09-17 DIAGNOSIS — R262 Difficulty in walking, not elsewhere classified: Secondary | ICD-10-CM | POA: Diagnosis not present

## 2018-09-17 DIAGNOSIS — M6281 Muscle weakness (generalized): Secondary | ICD-10-CM | POA: Diagnosis not present

## 2018-09-21 ENCOUNTER — Other Ambulatory Visit (INDEPENDENT_AMBULATORY_CARE_PROVIDER_SITE_OTHER): Payer: Self-pay | Admitting: Family Medicine

## 2018-09-21 ENCOUNTER — Telehealth (INDEPENDENT_AMBULATORY_CARE_PROVIDER_SITE_OTHER): Payer: Self-pay | Admitting: Family Medicine

## 2018-09-21 ENCOUNTER — Encounter (INDEPENDENT_AMBULATORY_CARE_PROVIDER_SITE_OTHER): Payer: Self-pay | Admitting: Family Medicine

## 2018-09-21 DIAGNOSIS — M6281 Muscle weakness (generalized): Secondary | ICD-10-CM | POA: Diagnosis not present

## 2018-09-21 DIAGNOSIS — M25552 Pain in left hip: Secondary | ICD-10-CM | POA: Diagnosis not present

## 2018-09-21 DIAGNOSIS — R262 Difficulty in walking, not elsewhere classified: Secondary | ICD-10-CM | POA: Diagnosis not present

## 2018-09-21 DIAGNOSIS — M79605 Pain in left leg: Secondary | ICD-10-CM | POA: Diagnosis not present

## 2018-09-21 MED ORDER — METHOCARBAMOL 750 MG PO TABS: 750.0000 mg | ORAL_TABLET | Freq: Four times a day (QID) | ORAL | 1 refills | Status: DC | PRN

## 2018-09-21 MED ORDER — HYDROCODONE-ACETAMINOPHEN 5-325 MG PO TABS: 1.0000 | ORAL_TABLET | Freq: Every evening | ORAL | 0 refills | Status: DC | PRN

## 2018-09-21 NOTE — Telephone Encounter (Signed)
Rx sent 

## 2018-09-21 NOTE — Telephone Encounter (Signed)
Patient called stated the baclofen made her feel anxious. Patient asked if there is something else she can take. Patient stopped taking the Baclofen. Patient asked if she can also get the Rx for (Hydrocodone) refilled as well. Patient said she had her 3rd visit with (PT) The number to contact patient is [phone]

## 2018-09-21 NOTE — Telephone Encounter (Signed)
Please advise 

## 2018-09-22 ENCOUNTER — Encounter (INDEPENDENT_AMBULATORY_CARE_PROVIDER_SITE_OTHER): Payer: Self-pay

## 2018-09-22 NOTE — Telephone Encounter (Signed)
Advised the patient of the new muscle relaxer and refill of hydrocodone that was sent in.  I added the baclofen to her allergy list per patient request - it made her feel anxious and "like my heart was racing."

## 2018-09-23 DIAGNOSIS — M25552 Pain in left hip: Secondary | ICD-10-CM | POA: Diagnosis not present

## 2018-09-23 DIAGNOSIS — M6281 Muscle weakness (generalized): Secondary | ICD-10-CM | POA: Diagnosis not present

## 2018-09-23 DIAGNOSIS — M79605 Pain in left leg: Secondary | ICD-10-CM | POA: Diagnosis not present

## 2018-09-23 DIAGNOSIS — R262 Difficulty in walking, not elsewhere classified: Secondary | ICD-10-CM | POA: Diagnosis not present

## 2018-09-27 DIAGNOSIS — Z319 Encounter for procreative management, unspecified: Secondary | ICD-10-CM | POA: Diagnosis not present

## 2018-09-27 DIAGNOSIS — E288 Other ovarian dysfunction: Secondary | ICD-10-CM | POA: Diagnosis not present

## 2018-09-30 DIAGNOSIS — N978 Female infertility of other origin: Secondary | ICD-10-CM | POA: Diagnosis not present

## 2018-09-30 DIAGNOSIS — E288 Other ovarian dysfunction: Secondary | ICD-10-CM | POA: Diagnosis not present

## 2018-09-30 DIAGNOSIS — N979 Female infertility, unspecified: Secondary | ICD-10-CM | POA: Diagnosis not present

## 2018-09-30 DIAGNOSIS — N83291 Other ovarian cyst, right side: Secondary | ICD-10-CM | POA: Diagnosis not present

## 2018-10-01 DIAGNOSIS — F419 Anxiety disorder, unspecified: Secondary | ICD-10-CM | POA: Diagnosis not present

## 2018-10-08 DIAGNOSIS — Z113 Encounter for screening for infections with a predominantly sexual mode of transmission: Secondary | ICD-10-CM | POA: Diagnosis not present

## 2018-10-08 DIAGNOSIS — N858 Other specified noninflammatory disorders of uterus: Secondary | ICD-10-CM | POA: Diagnosis not present

## 2018-10-08 DIAGNOSIS — Z319 Encounter for procreative management, unspecified: Secondary | ICD-10-CM | POA: Diagnosis not present

## 2018-10-08 DIAGNOSIS — Z3141 Encounter for fertility testing: Secondary | ICD-10-CM | POA: Diagnosis not present

## 2018-10-18 ENCOUNTER — Telehealth (INDEPENDENT_AMBULATORY_CARE_PROVIDER_SITE_OTHER): Payer: Self-pay | Admitting: Family Medicine

## 2018-10-18 MED ORDER — HYDROCODONE-ACETAMINOPHEN 5-325 MG PO TABS: 1.0000 | ORAL_TABLET | Freq: Every evening | ORAL | 0 refills | Status: DC | PRN

## 2018-10-18 NOTE — Telephone Encounter (Signed)
Please advise 

## 2018-10-18 NOTE — Telephone Encounter (Signed)
Short term Rx sent.

## 2018-10-18 NOTE — Telephone Encounter (Signed)
Patient is doing so much walking for work that her legs are really in pain.  She is wanting to know if she can have a refill of Hydrocodone? 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  Please call patient [phone]

## 2018-10-18 NOTE — Telephone Encounter (Signed)
I called and advised the patient. 

## 2018-10-27 DIAGNOSIS — F419 Anxiety disorder, unspecified: Secondary | ICD-10-CM | POA: Diagnosis not present

## 2018-10-28 DIAGNOSIS — R262 Difficulty in walking, not elsewhere classified: Secondary | ICD-10-CM | POA: Diagnosis not present

## 2018-10-28 DIAGNOSIS — M79605 Pain in left leg: Secondary | ICD-10-CM | POA: Diagnosis not present

## 2018-10-28 DIAGNOSIS — M25552 Pain in left hip: Secondary | ICD-10-CM | POA: Diagnosis not present

## 2018-10-28 DIAGNOSIS — M6281 Muscle weakness (generalized): Secondary | ICD-10-CM | POA: Diagnosis not present

## 2018-11-02 DIAGNOSIS — M6281 Muscle weakness (generalized): Secondary | ICD-10-CM | POA: Diagnosis not present

## 2018-11-02 DIAGNOSIS — R262 Difficulty in walking, not elsewhere classified: Secondary | ICD-10-CM | POA: Diagnosis not present

## 2018-11-02 DIAGNOSIS — M79605 Pain in left leg: Secondary | ICD-10-CM | POA: Diagnosis not present

## 2018-11-02 DIAGNOSIS — M25552 Pain in left hip: Secondary | ICD-10-CM | POA: Diagnosis not present

## 2018-12-15 DIAGNOSIS — N6489 Other specified disorders of breast: Secondary | ICD-10-CM | POA: Diagnosis not present

## 2018-12-15 DIAGNOSIS — Z1231 Encounter for screening mammogram for malignant neoplasm of breast: Secondary | ICD-10-CM | POA: Diagnosis not present

## 2018-12-28 DIAGNOSIS — F419 Anxiety disorder, unspecified: Secondary | ICD-10-CM | POA: Diagnosis not present

## 2018-12-28 DIAGNOSIS — Z6379 Other stressful life events affecting family and household: Secondary | ICD-10-CM | POA: Diagnosis not present

## 2019-01-19 DIAGNOSIS — E288 Other ovarian dysfunction: Secondary | ICD-10-CM | POA: Diagnosis not present

## 2019-01-19 DIAGNOSIS — Z319 Encounter for procreative management, unspecified: Secondary | ICD-10-CM | POA: Diagnosis not present

## 2019-01-26 DIAGNOSIS — Z6379 Other stressful life events affecting family and household: Secondary | ICD-10-CM | POA: Diagnosis not present

## 2019-01-26 DIAGNOSIS — F419 Anxiety disorder, unspecified: Secondary | ICD-10-CM | POA: Diagnosis not present

## 2019-02-07 ENCOUNTER — Other Ambulatory Visit: Payer: Self-pay

## 2019-02-07 ENCOUNTER — Encounter: Payer: Self-pay | Admitting: Family Medicine

## 2019-02-07 ENCOUNTER — Ambulatory Visit (INDEPENDENT_AMBULATORY_CARE_PROVIDER_SITE_OTHER): Payer: BC Managed Care – PPO | Admitting: Family Medicine

## 2019-02-07 DIAGNOSIS — M25561 Pain in right knee: Secondary | ICD-10-CM | POA: Diagnosis not present

## 2019-02-07 DIAGNOSIS — M2241 Chondromalacia patellae, right knee: Secondary | ICD-10-CM | POA: Diagnosis not present

## 2019-02-07 MED ORDER — NABUMETONE 750 MG PO TABS: 750.0000 mg | ORAL_TABLET | Freq: Two times a day (BID) | ORAL | 6 refills | Status: DC | PRN

## 2019-02-07 NOTE — Patient Instructions (Signed)
   Diagnosis:  Chondromalacia patella (early arthritis behind kneecap).  Treatment:    - Nabumetone as needed (anti-inflammatory)  - Glucosamine Sulfate 1,000 mg twice daily  - Cortisone injection if needed.

## 2019-02-07 NOTE — Progress Notes (Signed)
Office Visit Note   Patient: Lori Marshall           Date of Birth: 01-25-1976           MRN: [id number] Visit Date: 02/07/2019 Requested by: No referring provider defined for this encounter. PCP: Patient, No Pcp Per  Subjective: Chief Complaint  Patient presents with  . Right Knee - Pain    Knee started swelling 3 weeks ago.No specific injury. Went to [place] on vacation last week - increased pain with climbing or walking on anything unstable (like sand).    HPI: She is here with right knee pain.  Symptoms started about 3 weeks ago, no injury.  She was at the beach and started noticing pain when climbing and walking uneven surfaces.  She has had longstanding problems with her knees but this time it significantly worse.  Ibuprofen is not helping much.  Her knees make a lot of noise but has not had any popping sounds.  It does feel like it is locking when walking downstairs.               ROS: Chills, no respiratory symptoms.  No personal or family history of gout.  All other systems were reviewed and are negative.  Objective: Vital Signs: There were no vitals taken for this visit.  Physical Exam:  General:  Alert and oriented, in no acute distress. Pulm:  Breathing unlabored. Psy:  Normal mood, congruent affect. Skin: No erythema, no rash. Right knee: 2+ patellofemoral crepitus in both knees.  Trace effusion on the right.  Moderate pain with patella compression, negative patella apprehension.  No significant joint line tenderness, no pain or click with McMurray's.  Lockman's feels solid, no laxity with varus/valgus stress.  Imaging: None today.  Assessment & Plan: 1.  Right knee pain probably due to chondromalacia patella. -Patella strap brace or PSO brace during activity.  Anti-inflammatory as needed, glucosamine. -Cortisone injection if symptoms persist.  Gel injections if only short-term improvement.     Procedures: No procedures performed  No notes on file      PMFS History: Patient Active Problem List   Diagnosis Date Noted  . Alcohol abuse 08/12/2013  . Depression 08/11/2013  . Clostridium difficile colitis 08/09/2013  . Anxiety   . GERD 05/17/2010  . Irritable bowel syndrome 05/17/2010   Past Medical History:  Diagnosis Date  . Anxiety   . Depression   . Depressive disorder, not elsewhere classified   . Diarrhea   . Esophageal reflux   . Hypothyroid   . Irritable bowel syndrome   . Migraine, unspecified, without mention of intractable migraine without mention of status migrainosus   . Panic disorder without agoraphobia   . Vomiting alone     Family History  Problem Relation Age of Onset  . Diabetes Father   . Melanoma Father   . Colon polyps Father   . Colon cancer Neg Hx     Past Surgical History:  Procedure Laterality Date  . APPENDECTOMY    . CHOLECYSTECTOMY  08/19/2011   Procedure: LAPAROSCOPIC CHOLECYSTECTOMY WITH INTRAOPERATIVE CHOLANGIOGRAM;  Surgeon: Earnstine Regal, MD;  Location: [facility];  Service: General;  Laterality: N/A;  . COLON SURGERY    . WISDOM TOOTH EXTRACTION     Social History   Occupational History  . Occupation: Animator: [employer]  Tobacco Use  . Smoking status: Current Every Day Smoker    Packs/day: 0.50  Years: 5.00    Pack years: 2.50    Types: Cigarettes  . Smokeless tobacco: Never Used  Substance and Sexual Activity  . Alcohol use: No    Comment: socially  . Drug use: No  . Sexual activity: Not Currently    Birth control/protection: None

## 2019-02-16 DIAGNOSIS — Z113 Encounter for screening for infections with a predominantly sexual mode of transmission: Secondary | ICD-10-CM | POA: Diagnosis not present

## 2019-02-16 DIAGNOSIS — Z3183 Encounter for assisted reproductive fertility procedure cycle: Secondary | ICD-10-CM | POA: Diagnosis not present

## 2019-02-16 DIAGNOSIS — N979 Female infertility, unspecified: Secondary | ICD-10-CM | POA: Diagnosis not present

## 2019-02-24 DIAGNOSIS — F419 Anxiety disorder, unspecified: Secondary | ICD-10-CM | POA: Diagnosis not present

## 2019-02-24 DIAGNOSIS — Z6379 Other stressful life events affecting family and household: Secondary | ICD-10-CM | POA: Diagnosis not present

## 2019-03-09 DIAGNOSIS — F309 Manic episode, unspecified: Secondary | ICD-10-CM | POA: Diagnosis not present

## 2019-03-09 DIAGNOSIS — G4700 Insomnia, unspecified: Secondary | ICD-10-CM | POA: Diagnosis not present

## 2019-03-09 DIAGNOSIS — F419 Anxiety disorder, unspecified: Secondary | ICD-10-CM | POA: Diagnosis not present

## 2019-03-14 DIAGNOSIS — F4310 Post-traumatic stress disorder, unspecified: Secondary | ICD-10-CM | POA: Diagnosis not present

## 2019-03-14 DIAGNOSIS — F410 Panic disorder [episodic paroxysmal anxiety] without agoraphobia: Secondary | ICD-10-CM | POA: Diagnosis not present

## 2019-03-16 DIAGNOSIS — F419 Anxiety disorder, unspecified: Secondary | ICD-10-CM | POA: Diagnosis not present

## 2019-03-16 DIAGNOSIS — F309 Manic episode, unspecified: Secondary | ICD-10-CM | POA: Diagnosis not present

## 2019-03-16 DIAGNOSIS — F410 Panic disorder [episodic paroxysmal anxiety] without agoraphobia: Secondary | ICD-10-CM | POA: Diagnosis not present

## 2019-03-16 DIAGNOSIS — G4700 Insomnia, unspecified: Secondary | ICD-10-CM | POA: Diagnosis not present

## 2019-03-16 DIAGNOSIS — F4310 Post-traumatic stress disorder, unspecified: Secondary | ICD-10-CM | POA: Diagnosis not present

## 2019-03-23 DIAGNOSIS — Z113 Encounter for screening for infections with a predominantly sexual mode of transmission: Secondary | ICD-10-CM | POA: Diagnosis not present

## 2019-03-31 DIAGNOSIS — M25561 Pain in right knee: Secondary | ICD-10-CM | POA: Diagnosis not present

## 2019-04-06 DIAGNOSIS — Z3201 Encounter for pregnancy test, result positive: Secondary | ICD-10-CM | POA: Diagnosis not present

## 2019-04-06 DIAGNOSIS — Z3200 Encounter for pregnancy test, result unknown: Secondary | ICD-10-CM | POA: Diagnosis not present

## 2019-04-08 DIAGNOSIS — Z3201 Encounter for pregnancy test, result positive: Secondary | ICD-10-CM | POA: Diagnosis not present

## 2019-04-08 DIAGNOSIS — Z3200 Encounter for pregnancy test, result unknown: Secondary | ICD-10-CM | POA: Diagnosis not present

## 2019-04-14 DIAGNOSIS — F419 Anxiety disorder, unspecified: Secondary | ICD-10-CM | POA: Diagnosis not present

## 2019-04-14 DIAGNOSIS — G4700 Insomnia, unspecified: Secondary | ICD-10-CM | POA: Diagnosis not present

## 2019-04-14 DIAGNOSIS — F309 Manic episode, unspecified: Secondary | ICD-10-CM | POA: Diagnosis not present

## 2019-04-14 DIAGNOSIS — Z6379 Other stressful life events affecting family and household: Secondary | ICD-10-CM | POA: Diagnosis not present

## 2019-04-22 DIAGNOSIS — Z3200 Encounter for pregnancy test, result unknown: Secondary | ICD-10-CM | POA: Diagnosis not present

## 2019-05-02 DIAGNOSIS — O200 Threatened abortion: Secondary | ICD-10-CM | POA: Diagnosis not present

## 2019-05-03 DIAGNOSIS — F39 Unspecified mood [affective] disorder: Secondary | ICD-10-CM | POA: Diagnosis not present

## 2019-05-10 DIAGNOSIS — O0900 Supervision of pregnancy with history of infertility, unspecified trimester: Secondary | ICD-10-CM | POA: Diagnosis not present

## 2019-05-12 DIAGNOSIS — F419 Anxiety disorder, unspecified: Secondary | ICD-10-CM | POA: Diagnosis not present

## 2019-05-12 DIAGNOSIS — F39 Unspecified mood [affective] disorder: Secondary | ICD-10-CM | POA: Diagnosis not present

## 2019-05-17 DIAGNOSIS — Z3481 Encounter for supervision of other normal pregnancy, first trimester: Secondary | ICD-10-CM | POA: Diagnosis not present

## 2019-05-17 DIAGNOSIS — Z113 Encounter for screening for infections with a predominantly sexual mode of transmission: Secondary | ICD-10-CM | POA: Diagnosis not present

## 2019-05-17 DIAGNOSIS — Z3482 Encounter for supervision of other normal pregnancy, second trimester: Secondary | ICD-10-CM | POA: Diagnosis not present

## 2019-05-17 DIAGNOSIS — Z3685 Encounter for antenatal screening for Streptococcus B: Secondary | ICD-10-CM | POA: Diagnosis not present

## 2019-05-17 DIAGNOSIS — Z3400 Encounter for supervision of normal first pregnancy, unspecified trimester: Secondary | ICD-10-CM | POA: Diagnosis not present

## 2019-05-17 DIAGNOSIS — Z3480 Encounter for supervision of other normal pregnancy, unspecified trimester: Secondary | ICD-10-CM | POA: Diagnosis not present

## 2019-05-17 DIAGNOSIS — O219 Vomiting of pregnancy, unspecified: Secondary | ICD-10-CM | POA: Diagnosis not present

## 2019-05-19 LAB — OB RESULTS CONSOLE RUBELLA ANTIBODY, IGM: Rubella: IMMUNE

## 2019-05-19 LAB — OB RESULTS CONSOLE HEPATITIS B SURFACE ANTIGEN: Hepatitis B Surface Ag: NEGATIVE

## 2019-05-19 LAB — OB RESULTS CONSOLE RPR: RPR: NONREACTIVE

## 2019-05-26 DIAGNOSIS — F419 Anxiety disorder, unspecified: Secondary | ICD-10-CM | POA: Diagnosis not present

## 2019-05-26 DIAGNOSIS — O219 Vomiting of pregnancy, unspecified: Secondary | ICD-10-CM | POA: Diagnosis not present

## 2019-05-26 DIAGNOSIS — Z3400 Encounter for supervision of normal first pregnancy, unspecified trimester: Secondary | ICD-10-CM | POA: Diagnosis not present

## 2019-05-26 DIAGNOSIS — Z113 Encounter for screening for infections with a predominantly sexual mode of transmission: Secondary | ICD-10-CM | POA: Diagnosis not present

## 2019-05-26 DIAGNOSIS — F39 Unspecified mood [affective] disorder: Secondary | ICD-10-CM | POA: Diagnosis not present

## 2019-06-02 DIAGNOSIS — Z3682 Encounter for antenatal screening for nuchal translucency: Secondary | ICD-10-CM | POA: Diagnosis not present

## 2019-06-02 DIAGNOSIS — Z3A12 12 weeks gestation of pregnancy: Secondary | ICD-10-CM | POA: Diagnosis not present

## 2019-06-15 DIAGNOSIS — F39 Unspecified mood [affective] disorder: Secondary | ICD-10-CM | POA: Diagnosis not present

## 2019-06-28 DIAGNOSIS — G5601 Carpal tunnel syndrome, right upper limb: Secondary | ICD-10-CM | POA: Diagnosis not present

## 2019-06-28 DIAGNOSIS — M25531 Pain in right wrist: Secondary | ICD-10-CM | POA: Diagnosis not present

## 2019-06-28 DIAGNOSIS — R202 Paresthesia of skin: Secondary | ICD-10-CM | POA: Diagnosis not present

## 2019-07-05 DIAGNOSIS — F39 Unspecified mood [affective] disorder: Secondary | ICD-10-CM | POA: Diagnosis not present

## 2019-07-11 ENCOUNTER — Other Ambulatory Visit: Payer: Self-pay

## 2019-07-11 DIAGNOSIS — Z20822 Contact with and (suspected) exposure to covid-19: Secondary | ICD-10-CM

## 2019-07-12 LAB — NOVEL CORONAVIRUS, NAA: SARS-CoV-2, NAA: NOT DETECTED

## 2019-07-13 DIAGNOSIS — F39 Unspecified mood [affective] disorder: Secondary | ICD-10-CM | POA: Diagnosis not present

## 2019-07-14 ENCOUNTER — Telehealth: Payer: Self-pay | Admitting: General Practice

## 2019-07-14 NOTE — Telephone Encounter (Signed)
Patient called in and received her covid test result  °

## 2019-07-19 DIAGNOSIS — Z363 Encounter for antenatal screening for malformations: Secondary | ICD-10-CM | POA: Diagnosis not present

## 2019-07-19 DIAGNOSIS — Z361 Encounter for antenatal screening for raised alphafetoprotein level: Secondary | ICD-10-CM | POA: Diagnosis not present

## 2019-07-19 DIAGNOSIS — Z3A18 18 weeks gestation of pregnancy: Secondary | ICD-10-CM | POA: Diagnosis not present

## 2019-08-06 ENCOUNTER — Other Ambulatory Visit: Payer: Self-pay

## 2019-08-06 ENCOUNTER — Ambulatory Visit (INDEPENDENT_AMBULATORY_CARE_PROVIDER_SITE_OTHER): Payer: BC Managed Care – PPO | Admitting: Podiatry

## 2019-08-06 VITALS — BP 117/71 | HR 84

## 2019-08-06 DIAGNOSIS — I83813 Varicose veins of bilateral lower extremities with pain: Secondary | ICD-10-CM

## 2019-08-06 DIAGNOSIS — M79604 Pain in right leg: Secondary | ICD-10-CM

## 2019-08-06 DIAGNOSIS — R601 Generalized edema: Secondary | ICD-10-CM | POA: Diagnosis not present

## 2019-08-06 DIAGNOSIS — M79605 Pain in left leg: Secondary | ICD-10-CM

## 2019-08-08 ENCOUNTER — Encounter: Payer: Self-pay | Admitting: Podiatry

## 2019-08-08 NOTE — Progress Notes (Signed)
Subjective:  Patient ID: Lori Marshall, female    DOB: 03-May-1976,  MRN: [id number]  Chief Complaint  Patient presents with  . Foot Pain    bilateral arch pain - patient is [redacted] weeks pregnant  . Blister    bilateral heels - she is concerned about preeclampsia - she has headaches but her blood pressure has been great    43 y.o. female presents with the above complaint.  Patient states that she has pain to bilateral heels due to small soft tissue mass that are coursing along the medial to lateral circumferentially around the heel with soft tissue mass.  It is pretty much to the both lower extremity.  She states that she is [redacted] weeks pregnant.  She cannot get x-rays due to pregnancy.  She states that these masses have been very painful especially while ambulating.  They have been around for some time now.  She wanted to make sure there was not anything worrisome.  She denies any other acute complaints.  Patient has history of varicose veins.   Review of Systems: Negative except as noted in the HPI. Denies N/V/F/Ch.  Past Medical History:  Diagnosis Date  . Anxiety   . Depression   . Depressive disorder, not elsewhere classified   . Diarrhea   . Esophageal reflux   . Hypothyroid   . Irritable bowel syndrome   . Migraine, unspecified, without mention of intractable migraine without mention of status migrainosus   . Panic disorder without agoraphobia   . Vomiting alone     Current Outpatient Medications:  .  acyclovir (ZOVIRAX) 400 MG tablet, Take 1 tablet (400 mg total) by mouth 4 (four) times daily., Disp: 50 tablet, Rfl: 0 .  ALPRAZolam (XANAX) 0.5 MG tablet, TK 1 TABLET PO PRIOR TO PROCEDURE, Disp: , Rfl:  .  baclofen (LIORESAL) 10 MG tablet, Take 1 tablet (10 mg total) by mouth 3 (three) times daily as needed for muscle spasms., Disp: 30 each, Rfl: 3 .  BD DISP NEEDLES 22G X 1-1/2" MISC, , Disp: , Rfl:  .  BD DISP NEEDLES 30G X 1/2" MISC, , Disp: , Rfl:  .  busPIRone  (BUSPAR) 5 MG tablet, buspirone 5 mg tablet, Disp: , Rfl:  .  cetirizine (ZYRTEC) 10 MG tablet, Take 10 mg by mouth daily., Disp: , Rfl:  .  diazepam (VALIUM) 5 MG tablet, 1-2 PO 1 hour before MRI, repeat prn, Disp: 5 tablet, Rfl: 0 .  doxycycline (VIBRA-TABS) 100 MG tablet, , Disp: , Rfl:  .  estradiol (ESTRACE) 2 MG tablet, Take 2 mg by mouth 2 (two) times daily., Disp: , Rfl:  .  estradiol (VIVELLE-DOT) 0.1 MG/24HR patch, estradiol 0.1 mg/24 hr semiweekly transdermal patch, Disp: , Rfl:  .  Follitropin Alfa (GONAL-F RFF REDIJECT) 900 UNIT/1.5ML SOLN, Gonal-F RFF Redi-Ject 900 unit/1.5 mL subcutaneous pen injector, Disp: , Rfl:  .  Follitropin Beta (FOLLISTIM AQ) 900 UNT/1.08ML SOLN, Follistim AQ 900 unit/1.08 mL subcutaneous cartridge, Disp: , Rfl:  .  Ganirelix Acetate 250 MCG/0.5ML SOSY, ganirelix 250 mcg/0.5 mL subcutaneous syringe, Disp: , Rfl:  .  GONAL-F 1050 units SOLR, , Disp: , Rfl:  .  GONAL-F 450 units SOLR, , Disp: , Rfl:  .  hydrOXYzine (ATARAX/VISTARIL) 25 MG tablet, Take 25 mg by mouth 2 (two) times daily as needed., Disp: , Rfl:  .  influenza vac split quadrivalent PF (FLUARIX QUADRIVALENT) 0.5 ML injection, Fluarix Quad 2020-2021 (PF) 60 mcg (15 mcg x 4)/0.5 mL  IM syringe  ADM 0.5ML IM UTD, Disp: , Rfl:  .  letrozole (FEMARA) 2.5 MG tablet, TK 1 T PO QD, Disp: , Rfl: 3 .  LORazepam (ATIVAN) 0.5 MG tablet, Take 0.5 mg by mouth 2 (two) times daily as needed., Disp: , Rfl:  .  Menotropins (MENOPUR) 75 units SOLR, Menopur 75 unit subcutaneous solution, Disp: , Rfl:  .  nabumetone (RELAFEN) 750 MG tablet, Take 1 tablet (750 mg total) by mouth 2 (two) times daily as needed., Disp: 60 tablet, Rfl: 6 .  nitrofurantoin, macrocrystal-monohydrate, (MACROBID) 100 MG capsule, Take 100 mg by mouth 2 (two) times daily., Disp: , Rfl:  .  OLANZapine (ZYPREXA) 10 MG tablet, Take 10 mg by mouth at bedtime., Disp: , Rfl:  .  omeprazole (PRILOSEC) 40 MG capsule, Take 40 mg by mouth daily., Disp: ,  Rfl:  .  ondansetron (ZOFRAN) 4 MG tablet, Take 4 mg by mouth every 6 (six) hours as needed., Disp: , Rfl:  .  OVIDREL 250 MCG/0.5ML injection, INJECT AS DIRECTED BY DOCTOR, Disp: , Rfl: 3 .  progesterone (PROMETRIUM) 200 MG capsule, Take 200 mg by mouth at bedtime., Disp: , Rfl:  .  progesterone 50 MG/ML injection, , Disp: , Rfl:  .  promethazine (PHENERGAN) 25 MG tablet, Take 1 tablet (25 mg total) by mouth every 6 (six) hours as needed for nausea or vomiting., Disp: 30 tablet, Rfl: 1 .  sertraline (ZOLOFT) 50 MG tablet, sertraline 50 mg tablet, Disp: , Rfl:  .  traMADol (ULTRAM) 50 MG tablet, tramadol 50 mg tablet, Disp: , Rfl:  .  valACYclovir (VALTREX) 500 MG tablet, Take 500 mg by mouth 2 (two) times daily., Disp: , Rfl:   Current Facility-Administered Medications:  .  methylPREDNISolone acetate (DEPO-MEDROL) injection 40 mg, 40 mg, Intra-articular, Once, Hilts, Michael, MD  Social History   Tobacco Use  Smoking Status Current Every Day Smoker  . Packs/day: 0.50  . Years: 5.00  . Pack years: 2.50  . Types: Cigarettes  Smokeless Tobacco Never Used    Allergies  Allergen Reactions  . Dust Mite Extract Swelling  . Medrol [Methylprednisolone]   . Baclofen Anxiety and Palpitations  . Novocain [Procaine] Palpitations   Objective:   Vitals:   08/06/19 0900  BP: 117/71  Pulse: 84   There is no height or weight on file to calculate BMI. Constitutional Well developed. Well nourished.  Vascular Dorsalis pedis pulses palpable bilaterally. Posterior tibial pulses palpable bilaterally. Capillary refill normal to all digits.  No cyanosis or clubbing noted. Pedal hair growth normal.  Neurologic Normal speech. Oriented to person, place, and time. Epicritic sensation to light touch grossly present bilaterally.  Dermatologic  multiple lobulated masses noted in the subcutaneous tissue circumferentially from medial heel to lateral heel bilaterally.  They are mildly mobile no  translucency and it appears that it is connected to vein system.  Orthopedic: Normal joint ROM without pain or crepitus bilaterally. No visible deformities. No bony tenderness.   Radiographs: None Assessment:   1. Varicose veins of both lower extremities with pain   2. Generalized edema   3. Leg pain, bilateral    Plan:  Patient was evaluated and treated and all questions answered.  Bilateral soft tissue mass secondary to varicose veins -I explained to the patient the etiology and various treatment options for varicose veins with associated bulging varicosities.  I performed a aspiration of the soft tissue mass in an effort to diagnose properly what it contained.  However, as I entered the required depth of the soft tissue mass the plunger pulled back blood which was consistent with the venous system.  Therefore I believe that this is due to varicosities as it is also a close association with her veins.  Given this finding, there is no need to send the pathology.  I explained this to the patient and the patient agreed. -I explained to the patient aggressive elevation and compression socks the mainstay therapy to help reduce swelling in her lower extremity bilaterally.  With lower levels of swelling this will also decrease the varicosity with bulging soft tissue mass.  I also explained to her that there is no concerns for the soft tissue mass as long as edema/swelling is controlled.  She states that she will obtain the compression socks and will start wearing them more often.  No follow-ups on file.

## 2019-08-10 DIAGNOSIS — F39 Unspecified mood [affective] disorder: Secondary | ICD-10-CM | POA: Diagnosis not present

## 2019-08-16 DIAGNOSIS — O09512 Supervision of elderly primigravida, second trimester: Secondary | ICD-10-CM | POA: Diagnosis not present

## 2019-08-16 DIAGNOSIS — O09819 Supervision of pregnancy resulting from assisted reproductive technology, unspecified trimester: Secondary | ICD-10-CM | POA: Diagnosis not present

## 2019-08-16 DIAGNOSIS — Z3183 Encounter for assisted reproductive fertility procedure cycle: Secondary | ICD-10-CM | POA: Diagnosis not present

## 2019-09-02 NOTE — L&D Delivery Note (Signed)
Delivery Note At 6:41 PM a viable female was delivered via Vaginal, Spontaneous (Presentation: Left Occiput Anterior).  APGAR: 6, 9; weight pending.   Placenta status: Spontaneous, Intact.  Cord: 3 vessels with the following complications: Chorioamnionitis requiring amp/gent/clinda. Fever resolved after deliver. Cord pH: not sent  Anesthesia: Epidural Episiotomy: None Lacerations: 2nd degree Suture Repair: 3.0 vicryl Est. Blood Loss (mL): 400cc   It's a girl - Lori Marshall!!   Mom to postpartum.  Baby to Couplet care / Skin to Skin.  Ranae Pila 11/24/2019, 7:33 PM

## 2019-09-08 DIAGNOSIS — F39 Unspecified mood [affective] disorder: Secondary | ICD-10-CM | POA: Diagnosis not present

## 2019-09-12 DIAGNOSIS — Z23 Encounter for immunization: Secondary | ICD-10-CM | POA: Diagnosis not present

## 2019-09-12 DIAGNOSIS — Z3480 Encounter for supervision of other normal pregnancy, unspecified trimester: Secondary | ICD-10-CM | POA: Diagnosis not present

## 2019-09-13 LAB — OB RESULTS CONSOLE HIV ANTIBODY (ROUTINE TESTING): HIV: NONREACTIVE

## 2019-09-21 ENCOUNTER — Encounter: Payer: BC Managed Care – PPO | Attending: Obstetrics & Gynecology | Admitting: Registered"

## 2019-09-21 ENCOUNTER — Other Ambulatory Visit: Payer: Self-pay

## 2019-09-21 DIAGNOSIS — O99810 Abnormal glucose complicating pregnancy: Secondary | ICD-10-CM

## 2019-09-22 ENCOUNTER — Encounter: Payer: Self-pay | Admitting: Registered"

## 2019-09-22 DIAGNOSIS — F39 Unspecified mood [affective] disorder: Secondary | ICD-10-CM | POA: Diagnosis not present

## 2019-09-22 NOTE — Progress Notes (Signed)

## 2019-09-24 DIAGNOSIS — O99810 Abnormal glucose complicating pregnancy: Secondary | ICD-10-CM | POA: Insufficient documentation

## 2019-09-28 ENCOUNTER — Encounter (HOSPITAL_COMMUNITY): Payer: Self-pay | Admitting: Obstetrics and Gynecology

## 2019-09-28 ENCOUNTER — Inpatient Hospital Stay (HOSPITAL_COMMUNITY)
Admission: AD | Admit: 2019-09-28 | Discharge: 2019-09-28 | Disposition: A | Discharge status: Home / Self Care | Payer: BC Managed Care – PPO | Attending: Obstetrics and Gynecology | Admitting: Obstetrics and Gynecology

## 2019-09-28 ENCOUNTER — Other Ambulatory Visit: Payer: Self-pay

## 2019-09-28 DIAGNOSIS — O09523 Supervision of elderly multigravida, third trimester: Secondary | ICD-10-CM | POA: Insufficient documentation

## 2019-09-28 DIAGNOSIS — R42 Dizziness and giddiness: Secondary | ICD-10-CM | POA: Diagnosis not present

## 2019-09-28 DIAGNOSIS — Z833 Family history of diabetes mellitus: Secondary | ICD-10-CM | POA: Diagnosis not present

## 2019-09-28 DIAGNOSIS — Z888 Allergy status to other drugs, medicaments and biological substances status: Secondary | ICD-10-CM | POA: Diagnosis not present

## 2019-09-28 DIAGNOSIS — O26893 Other specified pregnancy related conditions, third trimester: Secondary | ICD-10-CM | POA: Insufficient documentation

## 2019-09-28 DIAGNOSIS — R112 Nausea with vomiting, unspecified: Secondary | ICD-10-CM | POA: Diagnosis not present

## 2019-09-28 DIAGNOSIS — O219 Vomiting of pregnancy, unspecified: Secondary | ICD-10-CM | POA: Diagnosis not present

## 2019-09-28 DIAGNOSIS — Z808 Family history of malignant neoplasm of other organs or systems: Secondary | ICD-10-CM | POA: Insufficient documentation

## 2019-09-28 DIAGNOSIS — Z884 Allergy status to anesthetic agent status: Secondary | ICD-10-CM | POA: Diagnosis not present

## 2019-09-28 DIAGNOSIS — O212 Late vomiting of pregnancy: Secondary | ICD-10-CM | POA: Insufficient documentation

## 2019-09-28 DIAGNOSIS — Z8371 Family history of colonic polyps: Secondary | ICD-10-CM | POA: Diagnosis not present

## 2019-09-28 DIAGNOSIS — Z3A28 28 weeks gestation of pregnancy: Secondary | ICD-10-CM | POA: Insufficient documentation

## 2019-09-28 DIAGNOSIS — Z87891 Personal history of nicotine dependence: Secondary | ICD-10-CM | POA: Diagnosis not present

## 2019-09-28 LAB — URINALYSIS, ROUTINE W REFLEX MICROSCOPIC
Bilirubin Urine: NEGATIVE
Glucose, UA: NEGATIVE mg/dL
Hgb urine dipstick: NEGATIVE
Ketones, ur: NEGATIVE mg/dL
Leukocytes,Ua: NEGATIVE
Nitrite: NEGATIVE
Protein, ur: 30 mg/dL — AB
Specific Gravity, Urine: 1.017 (ref 1.005–1.030)
pH: 6 (ref 5.0–8.0)

## 2019-09-28 LAB — CBC
HCT: 35.2 % — ABNORMAL LOW (ref 36.0–46.0)
Hemoglobin: 11.4 g/dL — ABNORMAL LOW (ref 12.0–15.0)
MCH: 29.2 pg (ref 26.0–34.0)
MCHC: 32.4 g/dL (ref 30.0–36.0)
MCV: 90 fL (ref 80.0–100.0)
Platelets: 267 10*3/uL (ref 150–400)
RBC: 3.91 MIL/uL (ref 3.87–5.11)
RDW: 13 % (ref 11.5–15.5)
WBC: 12.1 10*3/uL — ABNORMAL HIGH (ref 4.0–10.5)
nRBC: 0 % (ref 0.0–0.2)

## 2019-09-28 LAB — COMPREHENSIVE METABOLIC PANEL
ALT: 11 U/L (ref 0–44)
AST: 15 U/L (ref 15–41)
Albumin: 2.7 g/dL — ABNORMAL LOW (ref 3.5–5.0)
Alkaline Phosphatase: 99 U/L (ref 38–126)
Anion gap: 10 (ref 5–15)
BUN: 5 mg/dL — ABNORMAL LOW (ref 6–20)
CO2: 20 mmol/L — ABNORMAL LOW (ref 22–32)
Calcium: 9.1 mg/dL (ref 8.9–10.3)
Chloride: 106 mmol/L (ref 98–111)
Creatinine, Ser: 0.34 mg/dL — ABNORMAL LOW (ref 0.44–1.00)
GFR calc Af Amer: >60 mL/min (ref >60)
GFR calc non Af Amer: >60 mL/min (ref >60)
Glucose, Bld: 93 mg/dL (ref 70–99)
Potassium: 4.2 mmol/L (ref 3.5–5.1)
Sodium: 136 mmol/L (ref 135–145)
Total Bilirubin: 0.6 mg/dL (ref 0.3–1.2)
Total Protein: 5.5 g/dL — ABNORMAL LOW (ref 6.5–8.1)

## 2019-09-28 MED ORDER — LACTATED RINGERS IV BOLUS
1000.0000 mL | Freq: Once | INTRAVENOUS | Status: AC
  Administered 2019-09-28: 1000 mL via INTRAVENOUS

## 2019-09-28 MED ORDER — PROMETHAZINE HCL 25 MG/ML IJ SOLN
25.0000 mg | Freq: Once | INTRAMUSCULAR | Status: AC
  Administered 2019-09-28: 25 mg via INTRAVENOUS
  Filled 2019-09-28: qty 1

## 2019-09-28 MED ORDER — PROMETHAZINE HCL 25 MG PO TABS: 25.0000 mg | ORAL_TABLET | Freq: Four times a day (QID) | ORAL | 1 refills | Status: DC | PRN

## 2019-09-28 MED ORDER — DIPHENHYDRAMINE HCL 50 MG/ML IJ SOLN
25.0000 mg | Freq: Once | INTRAMUSCULAR | Status: AC
  Administered 2019-09-28: 25 mg via INTRAVENOUS
  Filled 2019-09-28: qty 1

## 2019-09-28 NOTE — Discharge Instructions (Signed)
[facility] Providers    [facility]       Phone: [phone]  [facility]   Phone: [phone]  [facility]  Phone: [phone]  [facility]  Phone: [phone]  [facility]  Phone: [phone]  [facility]   Phone: [phone]  [facility]       Phone: [phone]  [facility]    Phone: [phone]   [facility]    Phone: [phone]  [facility]    Phone: [phone]  [facility]    Phone: [phone]  [facility]       Phone: [phone]   [facility]  Phone: [phone]  [facility]    Phone: [phone]  [facility]   Phone: [phone]  [facility]      Phone: [phone]  [facility]    Phone: [phone]   

## 2019-09-28 NOTE — MAU Provider Note (Signed)
Chief Complaint:  Nausea, Emesis, Headache, and Dizziness   First Provider Initiated Contact with Patient 09/28/19 1313      HPI: Lori Marshall is a 44 y.o. G4P0030 at [redacted]w[redacted]d by LMP who presents to maternity admissions reporting nausea/vomiting this morning followed by dizziness and h/a.  She reports hx of nausea prior to pregnancy with occasional n/v in pregnancy treated with Phenergan. She was nauseous yesterday but did not take any medication. This morning, she felt the baby moving a lot, then started vomiting and had emesis x 4-5. Afterwards she developed a headache and dizziness.  She is sipping ginger ale and is keeping that down.  She has not tried to eat anything. She reports a hx of anxiety and is worried that something is wrong with the pregnancy.  She is feeling normal fetal movement. She tried Phenergan this morning at 4 am after the first episode of vomiting but it did not help and may not have stayed down. She has not tried any other treatments. She reports that other nausea medications do not work for her.      HPI  Past Medical History: Past Medical History:  Diagnosis Date  . Anxiety   . Depression   . Depressive disorder, not elsewhere classified   . Diarrhea   . Esophageal reflux   . Hypothyroid   . Irritable bowel syndrome   . Migraine, unspecified, without mention of intractable migraine without mention of status migrainosus   . Panic disorder without agoraphobia   . Vomiting alone     Past obstetric history: OB History  Gravida Para Term Preterm AB Living  4       3 0  SAB TAB Ectopic Multiple Live Births  2 1          # Outcome Date GA Lbr Len/2nd Weight Sex Delivery Anes PTL Lv  4 Current           3 TAB           2 SAB           1 SAB             Past Surgical History: Past Surgical History:  Procedure Laterality Date  . APPENDECTOMY    . CHOLECYSTECTOMY  08/19/2011   Procedure: LAPAROSCOPIC CHOLECYSTECTOMY WITH INTRAOPERATIVE  CHOLANGIOGRAM;  Surgeon: Earnstine Regal, MD;  Location: [facility];  Service: General;  Laterality: N/A;  . COLON SURGERY    . WISDOM TOOTH EXTRACTION      Family History: Family History  Problem Relation Age of Onset  . Diabetes Father   . Melanoma Father   . Colon polyps Father   . Colon cancer Neg Hx     Social History: Social History   Tobacco Use  . Smoking status: Former Smoker    Packs/day: 0.50    Years: 5.00    Pack years: 2.50    Types: Cigarettes    Quit date: 2020    Years since quitting: 1.0  . Smokeless tobacco: Never Used  Substance Use Topics  . Alcohol use: No    Comment: socially  . Drug use: No    Allergies:  Allergies  Allergen Reactions  . Dust Mite Extract Swelling  . Medrol [Methylprednisolone]   . Baclofen Anxiety and Palpitations  . Novocain [Procaine] Palpitations    Meds:  No medications prior to admission.    ROS:  Review of Systems  Constitutional: Negative for chills, fatigue and fever.  Eyes:  Negative for visual disturbance.  Respiratory: Negative for shortness of breath.   Cardiovascular: Negative for chest pain.  Gastrointestinal: Positive for nausea and vomiting. Negative for abdominal pain.  Genitourinary: Negative for difficulty urinating, dysuria, flank pain, pelvic pain, vaginal bleeding, vaginal discharge and vaginal pain.  Neurological: Positive for dizziness and headaches.  Psychiatric/Behavioral: Negative.      I have reviewed patient's Past Medical Hx, Surgical Hx, Family Hx, Social Hx, medications and allergies.   Physical Exam   Patient Vitals for the past 24 hrs:  BP Temp Temp src Pulse Resp SpO2 Height Weight  09/28/19 1538 112/74 -- -- -- -- -- -- --  09/28/19 1233 104/67 98.8 F (37.1 C) Oral (!) 105 16 99 % -- --  09/28/19 1229 -- -- -- -- -- -- 5\' 6"  (1.676 m) 84.9 kg   Constitutional: Well-developed, well-nourished female in moderate distress.  Cardiovascular: normal rate Respiratory: normal  effort GI: Abd soft, non-tender, gravid appropriate for gestational age.  MS: Extremities nontender, no edema, normal ROM Neurologic: Alert and oriented x 4.  GU: Neg CVAT.  PELVIC EXAM: Deferred     FHT:  Baseline 140, moderate variability, accelerations present, no decelerations Contractions: None on toco or to palpation   Labs: Results for orders placed or performed during the hospital encounter of 09/28/19 (from the past 24 hour(s))  Urinalysis, Routine w reflex microscopic     Status: Abnormal   Collection Time: 09/28/19  1:02 PM  Result Value Ref Range   Color, Urine YELLOW YELLOW   APPearance HAZY (A) CLEAR   Specific Gravity, Urine 1.017 1.005 - 1.030   pH 6.0 5.0 - 8.0   Glucose, UA NEGATIVE NEGATIVE mg/dL   Hgb urine dipstick NEGATIVE NEGATIVE   Bilirubin Urine NEGATIVE NEGATIVE   Ketones, ur NEGATIVE NEGATIVE mg/dL   Protein, ur 30 (A) NEGATIVE mg/dL   Nitrite NEGATIVE NEGATIVE   Leukocytes,Ua NEGATIVE NEGATIVE   RBC / HPF 0-5 0 - 5 RBC/hpf   WBC, UA 0-5 0 - 5 WBC/hpf   Bacteria, UA FEW (A) NONE SEEN   Squamous Epithelial / LPF 6-10 0 - 5   Mucus PRESENT   CBC     Status: Abnormal   Collection Time: 09/28/19  2:10 PM  Result Value Ref Range   WBC 12.1 (H) 4.0 - 10.5 K/uL   RBC 3.91 3.87 - 5.11 MIL/uL   Hemoglobin 11.4 (L) 12.0 - 15.0 g/dL   HCT 09/30/19 (L) 74.1 - 28.7 %   MCV 90.0 80.0 - 100.0 fL   MCH 29.2 26.0 - 34.0 pg   MCHC 32.4 30.0 - 36.0 g/dL   RDW 86.7 67.2 - 09.4 %   Platelets 267 150 - 400 K/uL   nRBC 0.0 0.0 - 0.2 %  Comprehensive metabolic panel     Status: Abnormal   Collection Time: 09/28/19  2:10 PM  Result Value Ref Range   Sodium 136 135 - 145 mmol/L   Potassium 4.2 3.5 - 5.1 mmol/L   Chloride 106 98 - 111 mmol/L   CO2 20 (L) 22 - 32 mmol/L   Glucose, Bld 93 70 - 99 mg/dL   BUN 5 (L) 6 - 20 mg/dL   Creatinine, Ser 09/30/19 (L) 0.44 - 1.00 mg/dL   Calcium 9.1 8.9 - 6.28 mg/dL   Total Protein 5.5 (L) 6.5 - 8.1 g/dL   Albumin 2.7 (L) 3.5 -  5.0 g/dL   AST 15 15 - 41 U/L   ALT 11  0 - 44 U/L   Alkaline Phosphatase 99 38 - 126 U/L   Total Bilirubin 0.6 0.3 - 1.2 mg/dL   GFR calc non Af Amer >60 >60 mL/min   GFR calc Af Amer >60 >60 mL/min   Anion gap 10 5 - 15      Imaging:  No results found.  MAU Course/MDM: Orders Placed This Encounter  Procedures  . Urinalysis, Routine w reflex microscopic  . CBC  . Comprehensive metabolic panel  . Discharge patient    Meds ordered this encounter  Medications  . lactated ringers bolus 1,000 mL  . promethazine (PHENERGAN) injection 25 mg  . diphenhydrAMINE (BENADRYL) injection 25 mg  . promethazine (PHENERGAN) 25 MG tablet    Sig: Take 1 tablet (25 mg total) by mouth every 6 (six) hours as needed for nausea or vomiting.    Dispense:  30 tablet    Refill:  1    Order Specific Question:   Supervising Provider    Answer:   Kathrynn Running [[id number]]     NST reviewed and reactive Pt anxious about her symptoms, is concerned that vomiting and/or dehydration could harm the baby.  Reassurance provided, reviewed normal FHR tracing with pt.   UA without evidence of dehydration but LR x 1000 ml given for pt symptoms.   CBC, CMP wnl Phenergan 25 mg IV push given with improvement in nausea, no vomiting in MAU Pt requested Benadryl for itchy, watery eyes in MAU so Benadryl 25 mg IV given Pt to keep scheduled appt in the office Return to MAU as needed for emergencies   Assessment: 1. Nausea and vomiting during pregnancy   2. Dizziness     Plan: Discharge home Labor precautions and fetal kick counts Follow-up Information    Marcelle Overlie, MD Follow up.   Specialty: Obstetrics and Gynecology Contact information: [address] [place] [address] [phone]          Allergies as of 09/28/2019      Reactions   Dust Mite Extract Swelling   Medrol [methylprednisolone]    Baclofen Anxiety, Palpitations   Novocain [procaine] Palpitations       Medication List    STOP taking these medications   ALPRAZolam 0.5 MG tablet Commonly known as: XANAX   baclofen 10 MG tablet Commonly known as: LIORESAL   diazepam 5 MG tablet Commonly known as: VALIUM   doxycycline 100 MG tablet Commonly known as: VIBRA-TABS   estradiol 0.1 MG/24HR patch Commonly known as: VIVELLE-DOT   estradiol 2 MG tablet Commonly known as: ESTRACE   Fluarix Quadrivalent 0.5 ML injection Generic drug: influenza vac split quadrivalent PF   Follistim AQ 900 UNT/1.08ML Soln Generic drug: Follitropin Beta   Ganirelix Acetate 250 MCG/0.5ML Sosy   Gonal-f 1050 units Solr Generic drug: Follitropin Alfa   Gonal-f 450 units Solr Generic drug: Follitropin Alfa   Gonal-f RFF Rediject 900 UNIT/1.5ML Soln Generic drug: Follitropin Alfa   letrozole 2.5 MG tablet Commonly known as: FEMARA   LORazepam 0.5 MG tablet Commonly known as: ATIVAN   Menopur 75 units Solr Generic drug: Menotropins   nabumetone 750 MG tablet Commonly known as: RELAFEN   nitrofurantoin (macrocrystal-monohydrate) 100 MG capsule Commonly known as: MACROBID   OLANZapine 10 MG tablet Commonly known as: ZYPREXA   Ovidrel 250 MCG/0.5ML injection Generic drug: Choriogonadotropin Alfa   traMADol 50 MG tablet Commonly known as: ULTRAM     TAKE these medications   acyclovir 400  MG tablet Commonly known as: ZOVIRAX Take 1 tablet (400 mg total) by mouth 4 (four) times daily.   BD Disp Needles 22G X 1-1/2" Misc Generic drug: NEEDLE (DISP) 22 G   BD Disp Needles 30G X 1/2" Misc Generic drug: NEEDLE (DISP) 30 G   busPIRone 5 MG tablet Commonly known as: BUSPAR buspirone 5 mg tablet   cetirizine 10 MG tablet Commonly known as: ZYRTEC Take 10 mg by mouth daily.   hydrOXYzine 25 MG tablet Commonly known as: ATARAX/VISTARIL Take 25 mg by mouth 2 (two) times daily as needed.   omeprazole 40 MG capsule Commonly known as: PRILOSEC Take 40 mg by mouth daily.    ondansetron 4 MG tablet Commonly known as: ZOFRAN Take 4 mg by mouth every 6 (six) hours as needed.   progesterone 200 MG capsule Commonly known as: PROMETRIUM Take 200 mg by mouth at bedtime.   progesterone 50 MG/ML injection   promethazine 25 MG tablet Commonly known as: PHENERGAN Take 1 tablet (25 mg total) by mouth every 6 (six) hours as needed for nausea or vomiting.   sertraline 50 MG tablet Commonly known as: ZOLOFT sertraline 50 mg tablet   valACYclovir 500 MG tablet Commonly known as: VALTREX Take 500 mg by mouth 2 (two) times daily.       Sharen Counter Certified Nurse-Midwife 09/28/2019 3:58 PM

## 2019-09-28 NOTE — MAU Note (Signed)
Lori Marshall is a 44 y.o. at [redacted]w[redacted]d here in MAU reporting: has been vomiting since about 0400 this morning. Is nauseated in triage. Emesis 4-5 times this AM. Tried some phenergan and had slight relief. The vomiting makes her dizzy and makes her have a headache. Denies bleeding, LOF, and painful UC's. +FM  Onset of complaint: today  Pain score: 5/10  Vitals:   09/28/19 1233  BP: 104/67  Pulse: (!) 105  Resp: 16  Temp: 98.8 F (37.1 C)  SpO2: 99%     FHT: +FM  Lab orders placed from triage: UA

## 2019-10-06 DIAGNOSIS — F39 Unspecified mood [affective] disorder: Secondary | ICD-10-CM | POA: Diagnosis not present

## 2019-10-19 DIAGNOSIS — O24419 Gestational diabetes mellitus in pregnancy, unspecified control: Secondary | ICD-10-CM | POA: Diagnosis not present

## 2019-10-19 DIAGNOSIS — Z3A31 31 weeks gestation of pregnancy: Secondary | ICD-10-CM | POA: Diagnosis not present

## 2019-11-09 DIAGNOSIS — Z3A34 34 weeks gestation of pregnancy: Secondary | ICD-10-CM | POA: Diagnosis not present

## 2019-11-09 DIAGNOSIS — O99891 Other specified diseases and conditions complicating pregnancy: Secondary | ICD-10-CM | POA: Diagnosis not present

## 2019-11-10 ENCOUNTER — Other Ambulatory Visit: Payer: Self-pay

## 2019-11-10 ENCOUNTER — Inpatient Hospital Stay (HOSPITAL_COMMUNITY)
Admission: AD | Admit: 2019-11-10 | Discharge: 2019-11-10 | Disposition: A | Discharge status: Home / Self Care | Payer: BC Managed Care – PPO | Attending: Obstetrics and Gynecology | Admitting: Obstetrics and Gynecology

## 2019-11-10 ENCOUNTER — Encounter (HOSPITAL_COMMUNITY): Payer: Self-pay | Admitting: Obstetrics and Gynecology

## 2019-11-10 DIAGNOSIS — O26893 Other specified pregnancy related conditions, third trimester: Secondary | ICD-10-CM

## 2019-11-10 DIAGNOSIS — O09523 Supervision of elderly multigravida, third trimester: Secondary | ICD-10-CM | POA: Insufficient documentation

## 2019-11-10 DIAGNOSIS — O368131 Decreased fetal movements, third trimester, fetus 1: Secondary | ICD-10-CM | POA: Diagnosis not present

## 2019-11-10 DIAGNOSIS — Z87891 Personal history of nicotine dependence: Secondary | ICD-10-CM | POA: Insufficient documentation

## 2019-11-10 DIAGNOSIS — O368130 Decreased fetal movements, third trimester, not applicable or unspecified: Secondary | ICD-10-CM | POA: Diagnosis not present

## 2019-11-10 DIAGNOSIS — Z3A35 35 weeks gestation of pregnancy: Secondary | ICD-10-CM | POA: Diagnosis not present

## 2019-11-10 DIAGNOSIS — R519 Headache, unspecified: Secondary | ICD-10-CM | POA: Insufficient documentation

## 2019-11-10 DIAGNOSIS — I10 Essential (primary) hypertension: Secondary | ICD-10-CM

## 2019-11-10 LAB — CBC
HCT: 34.5 % — ABNORMAL LOW (ref 36.0–46.0)
Hemoglobin: 11.3 g/dL — ABNORMAL LOW (ref 12.0–15.0)
MCH: 28.3 pg (ref 26.0–34.0)
MCHC: 32.8 g/dL (ref 30.0–36.0)
MCV: 86.5 fL (ref 80.0–100.0)
Platelets: 197 10*3/uL (ref 150–400)
RBC: 3.99 MIL/uL (ref 3.87–5.11)
RDW: 13.2 % (ref 11.5–15.5)
WBC: 11 10*3/uL — ABNORMAL HIGH (ref 4.0–10.5)
nRBC: 0 % (ref 0.0–0.2)

## 2019-11-10 LAB — COMPREHENSIVE METABOLIC PANEL
ALT: 14 U/L (ref 0–44)
AST: 15 U/L (ref 15–41)
Albumin: 2.4 g/dL — ABNORMAL LOW (ref 3.5–5.0)
Alkaline Phosphatase: 134 U/L — ABNORMAL HIGH (ref 38–126)
Anion gap: 10 (ref 5–15)
BUN: 6 mg/dL (ref 6–20)
CO2: 21 mmol/L — ABNORMAL LOW (ref 22–32)
Calcium: 9.1 mg/dL (ref 8.9–10.3)
Chloride: 107 mmol/L (ref 98–111)
Creatinine, Ser: 0.44 mg/dL (ref 0.44–1.00)
GFR calc Af Amer: >60 mL/min (ref >60)
GFR calc non Af Amer: >60 mL/min (ref >60)
Glucose, Bld: 113 mg/dL — ABNORMAL HIGH (ref 70–99)
Potassium: 3.7 mmol/L (ref 3.5–5.1)
Sodium: 138 mmol/L (ref 135–145)
Total Bilirubin: 0.5 mg/dL (ref 0.3–1.2)
Total Protein: 5.4 g/dL — ABNORMAL LOW (ref 6.5–8.1)

## 2019-11-10 LAB — URINALYSIS, ROUTINE W REFLEX MICROSCOPIC
Bilirubin Urine: NEGATIVE
Glucose, UA: NEGATIVE mg/dL
Hgb urine dipstick: NEGATIVE
Ketones, ur: NEGATIVE mg/dL
Leukocytes,Ua: NEGATIVE
Nitrite: NEGATIVE
Protein, ur: NEGATIVE mg/dL
Specific Gravity, Urine: 1.015 (ref 1.005–1.030)
pH: 6 (ref 5.0–8.0)

## 2019-11-10 LAB — PROTEIN / CREATININE RATIO, URINE
Creatinine, Urine: 111.88 mg/dL
Protein Creatinine Ratio: 0.08 mg/mg{Cre} (ref 0.00–0.15)
Total Protein, Urine: 9 mg/dL

## 2019-11-10 MED ORDER — BUTALBITAL-APAP-CAFFEINE 50-325-40 MG PO TABS
1.0000 | ORAL_TABLET | Freq: Once | ORAL | Status: AC
  Administered 2019-11-10: 1 via ORAL
  Filled 2019-11-10: qty 1

## 2019-11-10 MED ORDER — BUTALBITAL-APAP-CAFFEINE 50-325-40 MG PO TABS: 1.0000 | ORAL_TABLET | Freq: Four times a day (QID) | ORAL | 0 refills | Status: DC | PRN

## 2019-11-10 NOTE — MAU Note (Addendum)
PT SAYS SHE HAD LIGHT PINK D/C LAST NIGHT - NONE NOW.  SAYS WED NIGHT FELT PRESSURE . LAST MOVEMENT WAS  1030AM. FHR IN TRIAGE -155     HAS HAD H/A ALL DAY - TOOK REG TYLENOL 1 TAB AT NOON-  THEN BECAME NAUSEA - TOOK PHENERGAN AT 1230NOON - HELPED X2 HRS .  SEES SPOTS- STARTED LAST NIGHT - STOPPED  THEN CAME  AGAIN THIS MORNING .  FEELS DIZZY X2 HRS.   CALLED OFFICE TODAY AT 730PM- TOLD TO COME IN .  DENIES UC.

## 2019-11-10 NOTE — MAU Provider Note (Signed)
Chief Complaint:  Decreased Fetal Movement, Hypertension, Headache, and Nausea   First Provider Initiated Contact with Patient 11/10/19 2140     HPI: Lori Marshall is a 44 y.o. G4P0030 at 37w0dwho presents to maternity admissions reporting pelvic pressure, headache all day, seeing spots in her vision, and decreased fetal movement.  Frowning and very anxious with every question asked.  When asked a question  About a symptoms, frequently asks "is that bad?"  She denies LOF, vaginal bleeding, vaginal itching/burning, urinary symptoms, n/v, diarrhea, constipation or fever/chills.  She denies RUQ abdominal pain.  States blood pressure was elevated on home monitor in the 150s.   Hypertension This is a new problem. The current episode started today. The problem has been resolved since onset. Associated symptoms include anxiety and headaches. Pertinent negatives include no blurred vision, chest pain, malaise/fatigue, neck pain, orthopnea, peripheral edema or shortness of breath. There are no associated agents to hypertension. There are no known risk factors for coronary artery disease. Past treatments include nothing. There are no compliance problems.   Headache  This is a new problem. The current episode started today. The problem occurs constantly. The problem has been unchanged. The pain is located in the frontal and bilateral region. The pain does not radiate. The quality of the pain is described as aching. Associated symptoms include photophobia and a visual change. Pertinent negatives include no abdominal pain, back pain, blurred vision, neck pain or sinus pressure. Nothing aggravates the symptoms. She has tried nothing for the symptoms. Her past medical history is significant for hypertension.    RN Note: PT SAYS SHE HAD LIGHT PINK D/C LAST NIGHT - NONE NOW.  SAYS WED NIGHT FELT PRESSURE . LAST MOVEMENT WAS  1030AM. FHR IN TRIAGE -155     HAS HAD H/A ALL DAY - TOOK REG TYLENOL 1 TAB AT NOON-   THEN BECAME NAUSEA - TOOK PHENERGAN AT 1230NOON - HELPED X2 HRS .  SEES SPOTS- STARTED LAST NIGHT - STOPPED  THEN CAME  AGAIN THIS MORNING .  FEELS DIZZY X2 HRS.   CALLED OFFICE TODAY AT 730PM- TOLD TO COME IN .  DENIES UC.   Past Medical History: Past Medical History:  Diagnosis Date  . Anxiety   . Depression   . Depressive disorder, not elsewhere classified   . Diarrhea   . Esophageal reflux   . Hypothyroid   . Irritable bowel syndrome   . Migraine, unspecified, without mention of intractable migraine without mention of status migrainosus   . Panic disorder without agoraphobia   . Vomiting alone     Past obstetric history: OB History  Gravida Para Term Preterm AB Living  4       3 0  SAB TAB Ectopic Multiple Live Births  2 1          # Outcome Date GA Lbr Len/2nd Weight Sex Delivery Anes PTL Lv  4 Current           3 TAB           2 SAB           1 SAB             Past Surgical History: Past Surgical History:  Procedure Laterality Date  . APPENDECTOMY    . CHOLECYSTECTOMY  08/19/2011   Procedure: LAPAROSCOPIC CHOLECYSTECTOMY WITH INTRAOPERATIVE CHOLANGIOGRAM;  Surgeon: Velora Heckler, MD;  Location: [facility];  Service: General;  Laterality: N/A;  . COLON SURGERY    .  WISDOM TOOTH EXTRACTION      Family History: Family History  Problem Relation Age of Onset  . Diabetes Father   . Melanoma Father   . Colon polyps Father   . Colon cancer Neg Hx     Social History: Social History   Tobacco Use  . Smoking status: Former Smoker    Packs/day: 0.50    Years: 5.00    Pack years: 2.50    Types: Cigarettes    Quit date: 2020    Years since quitting: 1.1  . Smokeless tobacco: Never Used  Substance Use Topics  . Alcohol use: No    Comment: socially  . Drug use: No    Allergies:  Allergies  Allergen Reactions  . Dust Mite Extract Swelling  . Medrol [Methylprednisolone]   . Baclofen Anxiety and Palpitations  . Novocain [Procaine] Palpitations    Meds:   Medications Prior to Admission  Medication Sig Dispense Refill Last Dose  . acyclovir (ZOVIRAX) 400 MG tablet Take 1 tablet (400 mg total) by mouth 4 (four) times daily. 50 tablet 0 Past Month at Unknown time  . cetirizine (ZYRTEC) 10 MG tablet Take 10 mg by mouth daily.   Past Week at Unknown time  . omeprazole (PRILOSEC) 40 MG capsule Take 40 mg by mouth daily.   11/09/2019 at Unknown time  . promethazine (PHENERGAN) 25 MG tablet Take 1 tablet (25 mg total) by mouth every 6 (six) hours as needed for nausea or vomiting. 30 tablet 1 11/10/2019 at Unknown time  . BD DISP NEEDLES 22G X 1-1/2" MISC      . BD DISP NEEDLES 30G X 1/2" MISC      . busPIRone (BUSPAR) 5 MG tablet buspirone 5 mg tablet     . hydrOXYzine (ATARAX/VISTARIL) 25 MG tablet Take 25 mg by mouth 2 (two) times daily as needed.   More than a month at Unknown time  . ondansetron (ZOFRAN) 4 MG tablet Take 4 mg by mouth every 6 (six) hours as needed.   More than a month at Unknown time  . progesterone (PROMETRIUM) 200 MG capsule Take 200 mg by mouth at bedtime.     . progesterone 50 MG/ML injection      . sertraline (ZOLOFT) 50 MG tablet sertraline 50 mg tablet   More than a month at Unknown time  . valACYclovir (VALTREX) 500 MG tablet Take 500 mg by mouth 2 (two) times daily.       I have reviewed patient's Past Medical Hx, Surgical Hx, Family Hx, Social Hx, medications and allergies.   ROS:  Review of Systems  Constitutional: Negative for malaise/fatigue.  HENT: Negative for sinus pressure.   Eyes: Positive for photophobia. Negative for blurred vision.  Respiratory: Negative for shortness of breath.   Cardiovascular: Negative for chest pain and orthopnea.  Gastrointestinal: Negative for abdominal pain.  Musculoskeletal: Negative for back pain and neck pain.  Neurological: Positive for headaches.   Other systems negative  Physical Exam   Patient Vitals for the past 24 hrs:  BP Temp Temp src Pulse Resp Height Weight   11/10/19 2134 133/84 -- -- 96 -- -- --  11/10/19 2114 130/83 99.3 F (37.4 C) Oral 98 20 5\' 6"  (1.676 m) 87.1 kg   Vitals:   11/10/19 2230 11/10/19 2245 11/10/19 2300 11/10/19 2319  BP: 103/61 103/72 109/76 109/76  Pulse: 85 85 86 86  Resp:      Temp:      TempSrc:  Weight:      Height:        Constitutional: Well-developed, well-nourished female in no acute distress, but quite anxious.  Cardiovascular: normal rate and rhythm Respiratory: normal effort, clear to auscultation bilaterally GI: Abd soft, non-tender, gravid appropriate for gestational age.   No rebound or guarding. MS: Extremities nontender, no edema, normal ROM Neurologic: Alert and oriented x 4.  DTRs 2+, no clonues GU: Neg CVAT.  PELVIC EXAM:  Dilation: Closed Effacement (%): 70 Presentation: Vertex Exam by:: Britten Parady, CNM   FHT:  Baseline 140 , moderate variability, accelerations present, no decelerations Contractions:  Irregular with irritability   Labs: Results for orders placed or performed during the hospital encounter of 11/10/19 (from the past 24 hour(s))  Urinalysis, Routine w reflex microscopic     Status: Abnormal   Collection Time: 11/10/19  9:27 PM  Result Value Ref Range   Color, Urine YELLOW YELLOW   APPearance CLOUDY (A) CLEAR   Specific Gravity, Urine 1.015 1.005 - 1.030   pH 6.0 5.0 - 8.0   Glucose, UA NEGATIVE NEGATIVE mg/dL   Hgb urine dipstick NEGATIVE NEGATIVE   Bilirubin Urine NEGATIVE NEGATIVE   Ketones, ur NEGATIVE NEGATIVE mg/dL   Protein, ur NEGATIVE NEGATIVE mg/dL   Nitrite NEGATIVE NEGATIVE   Leukocytes,Ua NEGATIVE NEGATIVE  Protein / creatinine ratio, urine     Status: None   Collection Time: 11/10/19  9:27 PM  Result Value Ref Range   Creatinine, Urine 111.88 mg/dL   Total Protein, Urine 9 mg/dL   Protein Creatinine Ratio 0.08 0.00 - 0.15 mg/mg[Cre]  CBC     Status: Abnormal   Collection Time: 11/10/19 10:06 PM  Result Value Ref Range   WBC 11.0 (H) 4.0 - 10.5  K/uL   RBC 3.99 3.87 - 5.11 MIL/uL   Hemoglobin 11.3 (L) 12.0 - 15.0 g/dL   HCT 10.1 (L) 75.1 - 02.5 %   MCV 86.5 80.0 - 100.0 fL   MCH 28.3 26.0 - 34.0 pg   MCHC 32.8 30.0 - 36.0 g/dL   RDW 85.2 77.8 - 24.2 %   Platelets 197 150 - 400 K/uL   nRBC 0.0 0.0 - 0.2 %  Comprehensive metabolic panel     Status: Abnormal   Collection Time: 11/10/19 10:06 PM  Result Value Ref Range   Sodium 138 135 - 145 mmol/L   Potassium 3.7 3.5 - 5.1 mmol/L   Chloride 107 98 - 111 mmol/L   CO2 21 (L) 22 - 32 mmol/L   Glucose, Bld 113 (H) 70 - 99 mg/dL   BUN 6 6 - 20 mg/dL   Creatinine, Ser 3.53 0.44 - 1.00 mg/dL   Calcium 9.1 8.9 - 61.4 mg/dL   Total Protein 5.4 (L) 6.5 - 8.1 g/dL   Albumin 2.4 (L) 3.5 - 5.0 g/dL   AST 15 15 - 41 U/L   ALT 14 0 - 44 U/L   Alkaline Phosphatase 134 (H) 38 - 126 U/L   Total Bilirubin 0.5 0.3 - 1.2 mg/dL   GFR calc non Af Amer >60 >60 mL/min   GFR calc Af Amer >60 >60 mL/min   Anion gap 10 5 - 15     Imaging:  No results found.  MAU Course/MDM: I have ordered labs and reviewed results. All labs normal  NST reviewed, reactive.  Audible fetal movement, not felt by patient.  Reviewed all BPs have been normal, mostly low Reviewed labs are normal Fioricet given for headache with excellent  relief.  Visual changes went away also. Suspect migraine  Treatments in MAU included EFM, Fioricet.    Assessment: [place] IUP at [redacted]w[redacted]d Headache, likely migraine Normotensive Reactive fetal heart rate tracing with decreased perception of fetal movement  Plan: Discharge home Rx Fioricet for use at home for headaches Labor precautions and fetal kick counts Follow up in Office for prenatal visits   Encouraged to return here or to other Urgent Care/ED if she develops worsening of symptoms, increase in pain, fever, or other concerning symptoms.   Pt stable at time of discharge.  Hansel Feinstein CNM, MSN Certified Nurse-Midwife 11/10/2019 9:40 PM

## 2019-11-10 NOTE — Discharge Instructions (Signed)
Nonstress Test A nonstress test is a procedure that is done during pregnancy in order to check the baby's heartbeat. The procedure can help show if the baby (fetus) is healthy. It is commonly done when:  The baby is past his or her due date.  The pregnancy is high risk.  The baby is moving less than normal.  The mother has lost a pregnancy in the past.  The health care provider suspects a problem with the baby's growth.  There is too much or too little amniotic fluid. The procedure is often done in the third trimester of pregnancy to find out if an early delivery is needed and whether such a delivery is safe. During a nonstress test, the baby's heartbeat is monitored when the baby is resting and when the baby is moving. If the baby is healthy, the heart rate will increase when he or she moves or kicks and will return to normal when he or she rests. Tell a health care provider about:  Any allergies you have.  Any medical conditions you have.  All medicines you are taking, including vitamins, herbs, eye drops, creams, and over-the-counter medicines. What are the risks? There are no risks to you or your baby from a nonstress test. This procedure should not be painful or uncomfortable. What happens before the procedure?  Eat a meal right before the test or as directed by your health care provider. Food may help encourage the baby to move.  Use the restroom right before the test. What happens during the procedure?  Two monitors will be placed on your abdomen. One will record the baby's heart rate and the other will record the contractions of your uterus.  You may be asked to lie down on your side or to sit upright.  You may be given a button to press when you feel your baby move.  Your health care provider will listen to your baby's heartbeat and recorded it. He or she may also watch your baby's heartbeat on a screen.  If the baby seems to be sleeping, you may be asked to drink  some juice or soda, eat a snack, or change positions. The procedure may vary among health care providers and hospitals. What happens after the procedure?  Your health care provider will discuss the test results with you and make recommendations for the future. Depending on the results, your health care provider may order additional tests or another course of action.  If your health care provider gave you any diet or activity instructions, make sure to follow them.  Keep all follow-up visits as told by your health care provider. This is important. Summary  A nonstress test is a procedure that is done during pregnancy in order to check the baby's heartbeat. The procedure can help show if the baby is healthy.  The procedure is often done in the third trimester of pregnancy to find out if an early delivery is needed and whether such a delivery is safe.  During a nonstress test, the baby's heartbeat is monitored when the baby is resting and when the baby is moving. If the baby is healthy, the heart rate will increase when he or she moves or kicks and will return to normal when he or she rests.  Your health care provider will discuss the test results with you and make recommendations for the future. This information is not intended to replace advice given to you by your health care provider. Make sure you discuss any   questions you have with your health care provider. Document Revised: 11/27/2016 Document Reviewed: 11/27/2016 Elsevier Patient Education  2020 [employer]. Migraine Headache A migraine headache is an intense, throbbing pain on one side or both sides of the head. Migraine headaches may also cause other symptoms, such as nausea, vomiting, and sensitivity to light and noise. A migraine headache can last from 4 hours to 3 days. Talk with your doctor about what things may bring on (trigger) your migraine headaches. What are the causes? The exact cause of this condition is not known.  However, a migraine may be caused when nerves in the brain become irritated and release chemicals that cause inflammation of blood vessels. This inflammation causes pain. This condition may be triggered or caused by:  Drinking alcohol.  Smoking.  Taking medicines, such as: ? Medicine used to treat chest pain (nitroglycerin). ? Birth control pills. ? Estrogen. ? Certain blood pressure medicines.  Eating or drinking products that contain nitrates, glutamate, aspartame, or tyramine. Aged cheeses, chocolate, or caffeine may also be triggers.  Doing physical activity. Other things that may trigger a migraine headache include:  Menstruation.  Pregnancy.  Hunger.  Stress.  Lack of sleep or too much sleep.  Weather changes.  Fatigue. What increases the risk? The following factors may make you more likely to experience migraine headaches:  Being a certain age. This condition is more common in people who are 53-31 years old.  Being female.  Having a family history of migraine headaches.  Being Caucasian.  Having a mental health condition, such as depression or anxiety.  Being obese. What are the signs or symptoms? The main symptom of this condition is pulsating or throbbing pain. This pain may:  Happen in any area of the head, such as on one side or both sides.  Interfere with daily activities.  Get worse with physical activity.  Get worse with exposure to bright lights or loud noises. Other symptoms may include:  Nausea.  Vomiting.  Dizziness.  General sensitivity to bright lights, loud noises, or smells. Before you get a migraine headache, you may get warning signs (an aura). An aura may include:  Seeing flashing lights or having blind spots.  Seeing bright spots, halos, or zigzag lines.  Having tunnel vision or blurred vision.  Having numbness or a tingling feeling.  Having trouble talking.  Having muscle weakness. Some people have symptoms after a  migraine headache (postdromal phase), such as:  Feeling tired.  Difficulty concentrating. How is this diagnosed? A migraine headache can be diagnosed based on:  Your symptoms.  A physical exam.  Tests, such as: ? CT scan or an MRI of the head. These imaging tests can help rule out other causes of headaches. ? Taking fluid from the spine (lumbar puncture) and analyzing it (cerebrospinal fluid analysis, or CSF analysis). How is this treated? This condition may be treated with medicines that:  Relieve pain.  Relieve nausea.  Prevent migraine headaches. Treatment for this condition may also include:  Acupuncture.  Lifestyle changes like avoiding foods that trigger migraine headaches.  Biofeedback.  Cognitive behavioral therapy. Follow these instructions at home: Medicines  Take over-the-counter and prescription medicines only as told by your health care provider.  Ask your health care provider if the medicine prescribed to you: ? Requires you to avoid driving or using heavy machinery. ? Can cause constipation. You may need to take these actions to prevent or treat constipation:  Drink enough fluid to keep your urine  pale yellow.  Take over-the-counter or prescription medicines.  Eat foods that are high in fiber, such as beans, whole grains, and fresh fruits and vegetables.  Limit foods that are high in fat and processed sugars, such as fried or sweet foods. Lifestyle  Do not drink alcohol.  Do not use any products that contain nicotine or tobacco, such as cigarettes, e-cigarettes, and chewing tobacco. If you need help quitting, ask your health care provider.  Get at least 8 hours of sleep every night.  Find ways to manage stress, such as meditation, deep breathing, or yoga. General instructions      Keep a journal to find out what may trigger your migraine headaches. For example, write down: ? What you eat and drink. ? How much sleep you get. ? Any change  to your diet or medicines.  If you have a migraine headache: ? Avoid things that make your symptoms worse, such as bright lights. ? It may help to lie down in a dark, quiet room. ? Do not drive or use heavy machinery. ? Ask your health care provider what activities are safe for you while you are experiencing symptoms.  Keep all follow-up visits as told by your health care provider. This is important. Contact a health care provider if:  You develop symptoms that are different or more severe than your usual migraine headache symptoms.  You have more than 15 headache days in one month. Get help right away if:  Your migraine headache becomes severe.  Your migraine headache lasts longer than 72 hours.  You have a fever.  You have a stiff neck.  You have vision loss.  Your muscles feel weak or like you cannot control them.  You start to lose your balance often.  You have trouble walking.  You faint.  You have a seizure. Summary  A migraine headache is an intense, throbbing pain on one side or both sides of the head. Migraines may also cause other symptoms, such as nausea, vomiting, and sensitivity to light and noise.  This condition may be treated with medicines and lifestyle changes. You may also need to avoid certain things that trigger a migraine headache.  Keep a journal to find out what may trigger your migraine headaches.  Contact your health care provider if you have more than 15 headache days in a month or you develop symptoms that are different or more severe than your usual migraine headache symptoms. This information is not intended to replace advice given to you by your health care provider. Make sure you discuss any questions you have with your health care provider. Document Revised: 12/10/2018 Document Reviewed: 09/30/2018 Elsevier Patient Education  [place].

## 2019-11-14 DIAGNOSIS — O99891 Other specified diseases and conditions complicating pregnancy: Secondary | ICD-10-CM | POA: Diagnosis not present

## 2019-11-14 DIAGNOSIS — Z3A35 35 weeks gestation of pregnancy: Secondary | ICD-10-CM | POA: Diagnosis not present

## 2019-11-18 ENCOUNTER — Encounter (HOSPITAL_COMMUNITY): Payer: Self-pay | Admitting: Obstetrics and Gynecology

## 2019-11-18 ENCOUNTER — Observation Stay (HOSPITAL_COMMUNITY)
Admission: AD | Admit: 2019-11-18 | Discharge: 2019-11-19 | Disposition: A | Discharge status: Home / Self Care | Payer: BC Managed Care – PPO | Attending: Obstetrics and Gynecology | Admitting: Obstetrics and Gynecology

## 2019-11-18 ENCOUNTER — Observation Stay (HOSPITAL_BASED_OUTPATIENT_CLINIC_OR_DEPARTMENT_OTHER): Payer: BC Managed Care – PPO

## 2019-11-18 ENCOUNTER — Other Ambulatory Visit: Payer: Self-pay

## 2019-11-18 ENCOUNTER — Observation Stay (HOSPITAL_COMMUNITY): Payer: BC Managed Care – PPO

## 2019-11-18 DIAGNOSIS — H539 Unspecified visual disturbance: Secondary | ICD-10-CM

## 2019-11-18 DIAGNOSIS — Z3A36 36 weeks gestation of pregnancy: Secondary | ICD-10-CM

## 2019-11-18 DIAGNOSIS — Z3493 Encounter for supervision of normal pregnancy, unspecified, third trimester: Secondary | ICD-10-CM

## 2019-11-18 DIAGNOSIS — O99353 Diseases of the nervous system complicating pregnancy, third trimester: Secondary | ICD-10-CM

## 2019-11-18 DIAGNOSIS — Z20822 Contact with and (suspected) exposure to covid-19: Secondary | ICD-10-CM | POA: Diagnosis not present

## 2019-11-18 DIAGNOSIS — O09513 Supervision of elderly primigravida, third trimester: Secondary | ICD-10-CM

## 2019-11-18 DIAGNOSIS — Z363 Encounter for antenatal screening for malformations: Secondary | ICD-10-CM

## 2019-11-18 DIAGNOSIS — R1011 Right upper quadrant pain: Secondary | ICD-10-CM | POA: Diagnosis not present

## 2019-11-18 DIAGNOSIS — H53411 Scotoma involving central area, right eye: Secondary | ICD-10-CM | POA: Diagnosis not present

## 2019-11-18 DIAGNOSIS — R519 Headache, unspecified: Secondary | ICD-10-CM

## 2019-11-18 DIAGNOSIS — O26893 Other specified pregnancy related conditions, third trimester: Principal | ICD-10-CM | POA: Insufficient documentation

## 2019-11-18 DIAGNOSIS — O99283 Endocrine, nutritional and metabolic diseases complicating pregnancy, third trimester: Secondary | ICD-10-CM | POA: Diagnosis not present

## 2019-11-18 DIAGNOSIS — Z369 Encounter for antenatal screening, unspecified: Secondary | ICD-10-CM | POA: Diagnosis not present

## 2019-11-18 DIAGNOSIS — H538 Other visual disturbances: Secondary | ICD-10-CM | POA: Insufficient documentation

## 2019-11-18 DIAGNOSIS — Z8669 Personal history of other diseases of the nervous system and sense organs: Secondary | ICD-10-CM

## 2019-11-18 DIAGNOSIS — G43909 Migraine, unspecified, not intractable, without status migrainosus: Secondary | ICD-10-CM | POA: Diagnosis not present

## 2019-11-18 DIAGNOSIS — E039 Hypothyroidism, unspecified: Secondary | ICD-10-CM

## 2019-11-18 DIAGNOSIS — R42 Dizziness and giddiness: Secondary | ICD-10-CM | POA: Diagnosis not present

## 2019-11-18 DIAGNOSIS — O09523 Supervision of elderly multigravida, third trimester: Secondary | ICD-10-CM | POA: Diagnosis not present

## 2019-11-18 DIAGNOSIS — O99891 Other specified diseases and conditions complicating pregnancy: Secondary | ICD-10-CM

## 2019-11-18 DIAGNOSIS — N760 Acute vaginitis: Secondary | ICD-10-CM | POA: Diagnosis not present

## 2019-11-18 DIAGNOSIS — R399 Unspecified symptoms and signs involving the genitourinary system: Secondary | ICD-10-CM | POA: Diagnosis not present

## 2019-11-18 DIAGNOSIS — F419 Anxiety disorder, unspecified: Secondary | ICD-10-CM

## 2019-11-18 DIAGNOSIS — O99343 Other mental disorders complicating pregnancy, third trimester: Secondary | ICD-10-CM

## 2019-11-18 HISTORY — DX: Gestational diabetes mellitus in pregnancy, unspecified control: O24.419

## 2019-11-18 LAB — TYPE AND SCREEN
ABO/RH(D): O POS
Antibody Screen: NEGATIVE

## 2019-11-18 LAB — CBC
HCT: 34.5 % — ABNORMAL LOW (ref 36.0–46.0)
Hemoglobin: 11.4 g/dL — ABNORMAL LOW (ref 12.0–15.0)
MCH: 28.3 pg (ref 26.0–34.0)
MCHC: 33 g/dL (ref 30.0–36.0)
MCV: 85.6 fL (ref 80.0–100.0)
Platelets: 174 10*3/uL (ref 150–400)
RBC: 4.03 MIL/uL (ref 3.87–5.11)
RDW: 13.3 % (ref 11.5–15.5)
WBC: 10.6 10*3/uL — ABNORMAL HIGH (ref 4.0–10.5)
nRBC: 0 % (ref 0.0–0.2)

## 2019-11-18 LAB — URINALYSIS, ROUTINE W REFLEX MICROSCOPIC
Bilirubin Urine: NEGATIVE
Glucose, UA: NEGATIVE mg/dL
Hgb urine dipstick: NEGATIVE
Ketones, ur: NEGATIVE mg/dL
Leukocytes,Ua: NEGATIVE
Nitrite: NEGATIVE
Protein, ur: NEGATIVE mg/dL
Specific Gravity, Urine: 1.014 (ref 1.005–1.030)
pH: 6 (ref 5.0–8.0)

## 2019-11-18 LAB — PROTEIN / CREATININE RATIO, URINE
Creatinine, Urine: 113.79 mg/dL
Protein Creatinine Ratio: 0.13 mg/mg{Cre} (ref 0.00–0.15)
Total Protein, Urine: 15 mg/dL

## 2019-11-18 LAB — COMPREHENSIVE METABOLIC PANEL
ALT: 14 U/L (ref 0–44)
AST: 18 U/L (ref 15–41)
Albumin: 2.5 g/dL — ABNORMAL LOW (ref 3.5–5.0)
Alkaline Phosphatase: 142 U/L — ABNORMAL HIGH (ref 38–126)
Anion gap: 10 (ref 5–15)
BUN: <5 mg/dL — ABNORMAL LOW (ref 6–20)
CO2: 19 mmol/L — ABNORMAL LOW (ref 22–32)
Calcium: 8.6 mg/dL — ABNORMAL LOW (ref 8.9–10.3)
Chloride: 109 mmol/L (ref 98–111)
Creatinine, Ser: 0.43 mg/dL — ABNORMAL LOW (ref 0.44–1.00)
GFR calc Af Amer: >60 mL/min (ref >60)
GFR calc non Af Amer: >60 mL/min (ref >60)
Glucose, Bld: 89 mg/dL (ref 70–99)
Potassium: 4 mmol/L (ref 3.5–5.1)
Sodium: 138 mmol/L (ref 135–145)
Total Bilirubin: 0.3 mg/dL (ref 0.3–1.2)
Total Protein: 5.6 g/dL — ABNORMAL LOW (ref 6.5–8.1)

## 2019-11-18 LAB — ABO/RH: ABO/RH(D): O POS

## 2019-11-18 LAB — GLUCOSE, CAPILLARY: Glucose-Capillary: 138 mg/dL — ABNORMAL HIGH (ref 70–99)

## 2019-11-18 LAB — SARS CORONAVIRUS 2 (TAT 6-24 HRS): SARS Coronavirus 2: NEGATIVE

## 2019-11-18 MED ORDER — PRENATAL MULTIVITAMIN CH
1.0000 | ORAL_TABLET | Freq: Every day | ORAL | Status: DC
  Administered 2019-11-19: 1 via ORAL
  Filled 2019-11-18: qty 1

## 2019-11-18 MED ORDER — MAGNESIUM SULFATE 40 GM/1000ML IV SOLN
2.0000 g/h | INTRAVENOUS | Status: DC
  Administered 2019-11-18: 2 g/h via INTRAVENOUS

## 2019-11-18 MED ORDER — PROMETHAZINE HCL 25 MG/ML IJ SOLN
25.0000 mg | Freq: Four times a day (QID) | INTRAMUSCULAR | Status: DC | PRN
  Administered 2019-11-18 – 2019-11-19 (×4): 25 mg via INTRAVENOUS
  Filled 2019-11-18 (×4): qty 1

## 2019-11-18 MED ORDER — MAGNESIUM SULFATE 40 GM/1000ML IV SOLN
INTRAVENOUS | Status: AC
  Administered 2019-11-18: 6 g via INTRAVENOUS
  Filled 2019-11-18: qty 1000

## 2019-11-18 MED ORDER — HYDRALAZINE HCL 20 MG/ML IJ SOLN: 10.0000 mg | INTRAMUSCULAR | Status: DC | PRN

## 2019-11-18 MED ORDER — SODIUM CHLORIDE 0.45 % IV SOLN: INTRAVENOUS | Status: DC

## 2019-11-18 MED ORDER — LABETALOL HCL 5 MG/ML IV SOLN: 20.0000 mg | INTRAVENOUS | Status: DC | PRN

## 2019-11-18 MED ORDER — HYDROMORPHONE HCL 1 MG/ML IJ SOLN
1.0000 mg | INTRAMUSCULAR | Status: DC | PRN
  Administered 2019-11-19 (×2): 1 mg via INTRAVENOUS
  Filled 2019-11-18 (×4): qty 1

## 2019-11-18 MED ORDER — CALCIUM CARBONATE ANTACID 500 MG PO CHEW
2.0000 | CHEWABLE_TABLET | ORAL | Status: DC | PRN
  Administered 2019-11-19: 400 mg via ORAL
  Filled 2019-11-18: qty 2

## 2019-11-18 MED ORDER — MAGNESIUM SULFATE 40 GM/1000ML IV SOLN: 6.0000 g/h | Freq: Once | INTRAVENOUS | Status: DC

## 2019-11-18 MED ORDER — SODIUM CHLORIDE 0.9 % IV SOLN
50.0000 mg | Freq: Four times a day (QID) | INTRAVENOUS | Status: DC | PRN
  Administered 2019-11-18: 50 mg via INTRAVENOUS
  Filled 2019-11-18 (×2): qty 1

## 2019-11-18 MED ORDER — DOCUSATE SODIUM 100 MG PO CAPS
100.0000 mg | ORAL_CAPSULE | Freq: Every day | ORAL | Status: DC
  Administered 2019-11-19: 100 mg via ORAL
  Filled 2019-11-18: qty 1

## 2019-11-18 MED ORDER — MAGNESIUM SULFATE BOLUS VIA INFUSION
6.0000 g | Freq: Once | INTRAVENOUS | Status: AC
  Filled 2019-11-18: qty 1000

## 2019-11-18 MED ORDER — DIPHENHYDRAMINE HCL 50 MG/ML IJ SOLN
25.0000 mg | Freq: Once | INTRAMUSCULAR | Status: AC
  Administered 2019-11-18: 25 mg via INTRAVENOUS
  Filled 2019-11-18: qty 1

## 2019-11-18 MED ORDER — LABETALOL HCL 5 MG/ML IV SOLN: 80.0000 mg | INTRAVENOUS | Status: DC | PRN

## 2019-11-18 MED ORDER — ZOLPIDEM TARTRATE 5 MG PO TABS: 5.0000 mg | ORAL_TABLET | Freq: Every evening | ORAL | Status: DC | PRN

## 2019-11-18 MED ORDER — LACTATED RINGERS IV SOLN: INTRAVENOUS | Status: DC

## 2019-11-18 MED ORDER — METOCLOPRAMIDE HCL 5 MG/ML IJ SOLN
10.0000 mg | Freq: Once | INTRAMUSCULAR | Status: AC
  Administered 2019-11-18: 10 mg via INTRAVENOUS
  Filled 2019-11-18: qty 2

## 2019-11-18 MED ORDER — DEXAMETHASONE SODIUM PHOSPHATE 10 MG/ML IJ SOLN
10.0000 mg | Freq: Once | INTRAMUSCULAR | Status: AC
  Administered 2019-11-18: 10 mg via INTRAVENOUS
  Filled 2019-11-18: qty 1

## 2019-11-18 MED ORDER — LABETALOL HCL 5 MG/ML IV SOLN: 40.0000 mg | INTRAVENOUS | Status: DC | PRN

## 2019-11-18 MED ORDER — LACTATED RINGERS IV BOLUS
1000.0000 mL | Freq: Once | INTRAVENOUS | Status: AC
  Administered 2019-11-18: 1000 mL via INTRAVENOUS

## 2019-11-18 MED ORDER — OXYCODONE HCL 5 MG PO TABS
5.0000 mg | ORAL_TABLET | ORAL | Status: DC | PRN
  Administered 2019-11-18: 5 mg via ORAL
  Filled 2019-11-18 (×2): qty 1

## 2019-11-18 MED ORDER — ACETAMINOPHEN 500 MG PO TABS
1000.0000 mg | ORAL_TABLET | Freq: Four times a day (QID) | ORAL | Status: DC | PRN
  Administered 2019-11-18 – 2019-11-19 (×2): 1000 mg via ORAL
  Filled 2019-11-18 (×2): qty 2

## 2019-11-18 NOTE — Progress Notes (Signed)
MFM saw patient. See consultation note. Will follows recs which include:  - benadryl to allow sleep  - allow general diet - turn off magnesium - switch to NST to patient can sleep - tylenol and oxycodone prn pain. IV dilaudid prn breakthrough pain - imitrex in the AM if no improvement   Rosie Fate MD

## 2019-11-18 NOTE — MAU Note (Signed)
Covid swab collected. Pt tolerated well.

## 2019-11-18 NOTE — MAU Note (Signed)
Report given to [facility] charge nurse.  

## 2019-11-18 NOTE — H&P (Addendum)
Lori Marshall is a 44 y.o. female presenting for HA and general feeling of unwell. She has had migraines on and off this entire pregnancy. Intermittently has had vision changes and RUQ pain since 29 weeks. However, BP has not been elevated. Last week seen in MAU and discharged with Fioricet. Was normotensive at that time. She did not realize Fioricet has caffeine in it and has been taking it around the clock every 4 hours. She therefore has not slept in days and feels "jittery". She was seen in the office today and had a benign exam. However, BP high normal (130s/80s) and given patient's report of consistent HA, sent to MAU for further evaluation. Upon arriving, complained of new onset vision change in right eye that has now resolved. Also noted RUQ pain returned.  Reports high anxiety for which she is not taking any medication. She has a hx of suicide attempt in 2014. Reports not taking any medication except Fioricet and anti-emetic (phenergan).  This pregnancy was IVF with PGD (normal female per pt report).  Since receiving HA cocktail (reglan/benadryl), HA resolved for a little while. Vision loss resolved as well. Lori Marshall is back but very mild. Hasn't slept in almost 3 days and has ate very little s/s jitters. She feels exhausted.    OB History    Gravida  4   Para      Term      Preterm      AB  3   Living  0     SAB  2   TAB  1   Ectopic      Multiple      Live Births             Past Medical History:  Diagnosis Date  . Anxiety   . Depression   . Depressive disorder, not elsewhere classified   . Diarrhea   . Esophageal reflux   . Gestational diabetes   . Hypothyroid   . Irritable bowel syndrome   . Migraine, unspecified, without mention of intractable migraine without mention of status migrainosus   . Panic disorder without agoraphobia   . Vomiting alone    Past Surgical History:  Procedure Laterality Date  . APPENDECTOMY    . CHOLECYSTECTOMY  08/19/2011    Procedure: LAPAROSCOPIC CHOLECYSTECTOMY WITH INTRAOPERATIVE CHOLANGIOGRAM;  Surgeon: Earnstine Regal, MD;  Location: [facility];  Service: General;  Laterality: N/A;  . COLON SURGERY    . WISDOM TOOTH EXTRACTION     Family History: family history includes Colon polyps in her father; Diabetes in her father; Melanoma in her father. Social History:  reports that she quit smoking about 14 months ago. Her smoking use included cigarettes. She has a 2.50 pack-year smoking history. She has never used smokeless tobacco. She reports that she does not drink alcohol or use drugs.     Maternal Diabetes: Yes:  Diabetes Type:  Diet controlled Genetic Screening: Normal Maternal Ultrasounds/Referrals: Normal Fetal Ultrasounds or other Referrals:  Fetal echo Maternal Substance Abuse:  No Significant Maternal Medications:  None Significant Maternal Lab Results:  None Other Comments:  None  Review of Systems History   Blood pressure 119/74, pulse 78, temperature 98.3 F (36.8 C), resp. rate 16, height 5\' 6"  (1.676 m), weight 88.9 kg, SpO2 100 %. Exam Physical Exam   NAD, A&O, talking, smiling, laughing, excited to see baby on the [place] NWOB Abd soft, nondistended, gravid, no epigastric pain. Mild RUQ tenderness in one pinpoint areaPelvic deferred -  closed/lg/high in office   Prenatal labs: ABO, Rh:   Antibody:   Rubella:   RPR:    HBsAg:    HIV:    GBS:   unknown  Assessment/Plan: 44 yo G4P0 @36 .1 wga presenting with severe HA responsive to regland/benadryl in addition to vision changes. Assessing notes from this pregnancy and per pt report, patient has had intermittent migraines on and off - many severe. She has had one BP elevation to 145/96. However, given severe HA and unilateral vision change upon presentation in MAU, she was started on Magnesium IV.  Currently, she is well appearing and in NAD. Vision returned with resolution of HA. Of note, given age 62 and hx IVF she is certainly at risk of  PIH.    - MFM consultation given atypical presentation - cannot completely exclude atypical PIH but pt w/only one barely mild range BP elevation. Reassuring that HA is improved, RUQ minimal, and vision change resolved - rest of BP wnl. PIH labs WNL. UPC WNL.  - CT head WNL and without any acute abnormality or PRESS - NPO for now but consider general diet as long as pt remains stable - s/p IV reglan, benadryl and decadron with vast improvement HA - Tylenol 1000mg  q 6 prn - phenergan 25mg  prn nausea - BMZ deferred given hx steroid allergy - consider turning off Magnesium if okay with MFM  # A1GDM - good control. Check BS fasting/pp while eating.    # GBS unknown - if induction, plan PCN given GBS unknown   45 11/18/2019, 5:11 PM

## 2019-11-18 NOTE — MAU Note (Signed)
.   Lori Marshall is a 45 y.o. at [redacted]w[redacted]d here in MAU reporting: headache with dizziness for 3 days. Has taken fioricet this morning around 4. States that she cant sleep because she feels so bad  Onset of complaint: 3 daya Pain score: 9 Vitals:   11/18/19 1248  BP: 121/79  Pulse: 83  Resp: 18  Temp: 98.3 F (36.8 C)  SpO2: 98%     FHT:140 Lab orders placed from triage: UA

## 2019-11-18 NOTE — MAU Provider Note (Signed)
History     CSN: [id number]  Arrival date and time: 11/18/19 1232   First Provider Initiated Contact with Patient 11/18/19 1327      Chief Complaint  Patient presents with  . Headache  . Dizziness   HPI  Ms.  Lori Marshall is a 44 y.o. year old G54P0030 female at [redacted]w[redacted]d weeks gestation who was sent to MAU reporting migraines throughout her pregnancy, taking Fioricet around the clock for the past 3 days and has not slept in 3 days. She also complains of seeing spots & flashing lights only in her right eye, but now having decreasing vision in the right eye. She reports having a "stabbing pain in the RUQ of her abdomen." She receives her [facility] with [facility]. She denies any vaginal bleeding, abnormal vaginal discharge, contractions. She reports good FM today.   Past Medical History:  Diagnosis Date  . Anxiety   . Depression   . Depressive disorder, not elsewhere classified   . Diarrhea   . Esophageal reflux   . Gestational diabetes   . Hypothyroid   . Irritable bowel syndrome   . Migraine, unspecified, without mention of intractable migraine without mention of status migrainosus   . Panic disorder without agoraphobia   . Vomiting alone     Past Surgical History:  Procedure Laterality Date  . APPENDECTOMY    . CHOLECYSTECTOMY  08/19/2011   Procedure: LAPAROSCOPIC CHOLECYSTECTOMY WITH INTRAOPERATIVE CHOLANGIOGRAM;  Surgeon: Earnstine Regal, MD;  Location: [facility];  Service: General;  Laterality: N/A;  . COLON SURGERY    . WISDOM TOOTH EXTRACTION      Family History  Problem Relation Age of Onset  . Diabetes Father   . Melanoma Father   . Colon polyps Father   . Colon cancer Neg Hx     Social History   Tobacco Use  . Smoking status: Former Smoker    Packs/day: 0.50    Years: 5.00    Pack years: 2.50    Types: Cigarettes    Quit date: 2020    Years since quitting: 1.2  . Smokeless tobacco: Never Used  Substance Use Topics  . Alcohol use: No   Comment: socially  . Drug use: No    Allergies:  Allergies  Allergen Reactions  . Dust Mite Extract Swelling  . Medrol [Methylprednisolone]   . Baclofen Anxiety and Palpitations  . Novocain [Procaine] Palpitations    Medications Prior to Admission  Medication Sig Dispense Refill Last Dose  . BD DISP NEEDLES 22G X 1-1/2" MISC    11/17/2019 at Unknown time  . BD DISP NEEDLES 30G X 1/2" MISC    11/17/2019 at Unknown time  . butalbital-acetaminophen-caffeine (FIORICET) 50-325-40 MG tablet Take 1-2 tablets by mouth every 6 (six) hours as needed for headache. 20 tablet 0 11/18/2019 at 0400  . promethazine (PHENERGAN) 25 MG tablet Take 1 tablet (25 mg total) by mouth every 6 (six) hours as needed for nausea or vomiting. 30 tablet 1 11/18/2019 at Unknown time  . valACYclovir (VALTREX) 500 MG tablet Take 500 mg by mouth 2 (two) times daily.   Past Month at Unknown time  . acyclovir (ZOVIRAX) 400 MG tablet Take 1 tablet (400 mg total) by mouth 4 (four) times daily. 50 tablet 0   . busPIRone (BUSPAR) 5 MG tablet buspirone 5 mg tablet     . cetirizine (ZYRTEC) 10 MG tablet Take 10 mg by mouth daily.     . hydrOXYzine (ATARAX/VISTARIL)  25 MG tablet Take 25 mg by mouth 2 (two) times daily as needed.     [name] omeprazole (PRILOSEC) 40 MG capsule Take 40 mg by mouth daily.     . ondansetron (ZOFRAN) 4 MG tablet Take 4 mg by mouth every 6 (six) hours as needed.     . progesterone (PROMETRIUM) 200 MG capsule Take 200 mg by mouth at bedtime.     . progesterone 50 MG/ML injection      . sertraline (ZOLOFT) 50 MG tablet sertraline 50 mg tablet       Review of Systems  Constitutional: Negative.   HENT: Negative.   Eyes: Positive for visual disturbance.  Respiratory: Negative.   Cardiovascular: Negative.   Gastrointestinal: Positive for abdominal pain (RUQ pain).  Endocrine: Negative.   Genitourinary: Negative.   Musculoskeletal: Negative.   Skin: Negative.   Neurological: Positive for headaches  (migraine).  Hematological: Negative.   Psychiatric/Behavioral: Negative.    Physical Exam   Blood pressure 121/79, pulse 83, temperature 98.3 F (36.8 C), resp. rate 18, weight 88.9 kg, SpO2 98 %.  Physical Exam  Nursing note and vitals reviewed. Constitutional: She is oriented to person, place, and time. She appears well-developed and well-nourished.  HENT:  Head: Normocephalic and atraumatic.  Cardiovascular: Normal rate, regular rhythm, normal heart sounds and intact distal pulses.  Respiratory: Effort normal and breath sounds normal.  GI: Soft. Bowel sounds are normal. There is abdominal tenderness (over liver border).  Musculoskeletal:        General: Normal range of motion.  Neurological: She is alert and oriented to person, place, and time. She has normal reflexes.  Brisk reflexes, no clonus  Skin: Skin is warm and dry.  Psychiatric: She has a normal mood and affect. Her behavior is normal. Judgment and thought content normal.   NST - FHR: 135 bpm / moderate variability / accels present / decels absent / TOCO: Occ UC with UI  MAU Course  Procedures  MDM CCUA Received migraine headache cocktail (IVFs with benadryl 25 mg IVP, metoclopramide 10 mg IVP, Decadron 10 mg IVP) -- H/A resolved CCUA CBC CMP P/C Ratio Serial BP's  MgSO4 6 gm loading dose IV, then 2 gm/hr -- H/A and scomata resolved   Results for orders placed or performed during the hospital encounter of 11/18/19 (from the past 24 hour(s))  CBC     Status: Abnormal   Collection Time: 11/18/19  2:08 PM  Result Value Ref Range   WBC 10.6 (H) 4.0 - 10.5 K/uL   RBC 4.03 3.87 - 5.11 MIL/uL   Hemoglobin 11.4 (L) 12.0 - 15.0 g/dL   HCT 96.2 (L) 83.6 - 62.9 %   MCV 85.6 80.0 - 100.0 fL   MCH 28.3 26.0 - 34.0 pg   MCHC 33.0 30.0 - 36.0 g/dL   RDW 47.6 54.6 - 50.3 %   Platelets 174 150 - 400 K/uL   nRBC 0.0 0.0 - 0.2 %      *Consult with Dr. Macon Large @ 1357 - notified of patient's complaints, assessments,  lab & U/S results, recommended tx plan CT of head to R/O PRES, PEC and start MgSO4 6 gram bolus then 2 grams/hr  Dr. Elon Spanner called @ 1416 to notify of assessment findings & Dr. Mont Dutton recommendations -- Drs. Leger and Anyanwu talked on the phone at this time -- both agree about the management  Admission orders will be entered by Dr. Elon Spanner @ 1640.  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 Elon Spanner assumes care of patient and will enter admission orders   Raelyn Mora, MSN, CNM 11/18/2019, 1:27 PM

## 2019-11-18 NOTE — Consult Note (Signed)
MFM Note  Lori Marshall is a 44 year old gravida 1 para 0 who was seen in consultation at the request of Dr. Royston Sinner due to a severe headache.  She is currently at 36 weeks and 1 day.  The patient reports that she has experienced headaches on and off since the first trimester of her current pregnancy.  The headaches have been increasing in frequency and have been getting worse over the past month or so.  She was recently prescribed Fioricet for treatment of her headaches.  The patient reports that she has taken Fioricet regularly on a daily basis for the past few weeks.  As Fioricet contains caffeine, the patient reports that she has been unable to sleep for the past 3 days.  The patient reports a history of migraine headaches which started when she was in her 21s.    During a prenatal visit this afternoon, the patient was noted to have mildly elevated blood pressures in the 140s over 80s range.  Due to concerns regarding preeclampsia, the patient was then sent to the MAU for further evaluation.  In the MAU, the patient began to complain of some visual changes and seeing spots in front of her eyes along with the severe headache.  She was then immediately started on magnesium sulfate for maternal seizure prophylaxis.    A CT scan of the head was negative for any intracranial pathology.  Her Velva labs were all within normal limits.  A P/C ratio was 0.13 indicating that she does not have significant proteinuria.   A bedside ultrasound performed today showed that the fetus is in the vertex presentation with an estimated fetal weight of 6 pounds 11 ounces.  The AFI was within normal limits (14.93cm).  Over the past few hours, the patient's blood pressures have been in the low 100-110s over 50s to 70s range.  She reports that her headache is slightly better.  Her fetal status is reassuring.  Aside from her history of migraine headaches, the patient also reports a significant anxiety disorder.  Her past  surgical history includes an appendectomy, a cholecystectomy, and wisdom teeth removal.  This is an IVF pregnancy.  The patient and her husband were advised that based on her normal blood pressures, her normal PIH labs, and the P/C ratio which did not indicate significant proteinuria, she probably does not have preeclampsia.  I believe that her headache is most likely an exacerbation of her migraine headaches which may have been heightened from the lack of sleep over the past 3 days.  As she does not have preeclampsia, magnesium sulfate may be discontinued tonight.  The patient and her husband are agreeable with the plan for her to eat dinner tonight and then take a higher dose of Benadryl to help her sleep.  She will also be given a narcotic pain medication tonight to help her sleep.  We will reassess her headaches in the morning tomorrow.  Should she feel better, Imitrex (which is on formulary here in the hospital) may be tried for treatment of her migraine headaches.  Should the Imitrex resolve her headaches, the patient may be discharged home on Imitrex for the next week or so.  The patient and her husband were reassured that I have had many pregnant patients take Imitrex during pregnancy with good outcomes.  Delivery may be considered should her headaches persist or worsen despite this treatment.  At the end of the consultation the patient and her husband stated that all their questions had  been answered to their complete satisfaction.    Thank you for referring this very nice patient for Maternal-Fetal Medicine consultation.    Recommendations:  -Discontinue magnesium -The patient may be allowed to eat dinner  -Benadryl for sleep  -Narcotic pain medication tonight to help her sleep  -Imitrex may be started in the morning for treatment of her migraine headaches -Delivery should be considered should her headache persist or worsen.

## 2019-11-19 LAB — COMPREHENSIVE METABOLIC PANEL
ALT: 13 U/L (ref 0–44)
AST: 16 U/L (ref 15–41)
Albumin: 2.3 g/dL — ABNORMAL LOW (ref 3.5–5.0)
Alkaline Phosphatase: 133 U/L — ABNORMAL HIGH (ref 38–126)
Anion gap: 11 (ref 5–15)
BUN: <5 mg/dL — ABNORMAL LOW (ref 6–20)
CO2: 20 mmol/L — ABNORMAL LOW (ref 22–32)
Calcium: 8.4 mg/dL — ABNORMAL LOW (ref 8.9–10.3)
Chloride: 105 mmol/L (ref 98–111)
Creatinine, Ser: 0.48 mg/dL (ref 0.44–1.00)
GFR calc Af Amer: >60 mL/min (ref >60)
GFR calc non Af Amer: >60 mL/min (ref >60)
Glucose, Bld: 120 mg/dL — ABNORMAL HIGH (ref 70–99)
Potassium: 3.7 mmol/L (ref 3.5–5.1)
Sodium: 136 mmol/L (ref 135–145)
Total Bilirubin: 0.3 mg/dL (ref 0.3–1.2)
Total Protein: 5 g/dL — ABNORMAL LOW (ref 6.5–8.1)

## 2019-11-19 LAB — GLUCOSE, CAPILLARY
Glucose-Capillary: 121 mg/dL — ABNORMAL HIGH (ref 70–99)
Glucose-Capillary: 147 mg/dL — ABNORMAL HIGH (ref 70–99)

## 2019-11-19 LAB — CBC
HCT: 33.6 % — ABNORMAL LOW (ref 36.0–46.0)
Hemoglobin: 10.6 g/dL — ABNORMAL LOW (ref 12.0–15.0)
MCH: 28.6 pg (ref 26.0–34.0)
MCHC: 31.5 g/dL (ref 30.0–36.0)
MCV: 90.8 fL (ref 80.0–100.0)
Platelets: 183 10*3/uL (ref 150–400)
RBC: 3.7 MIL/uL — ABNORMAL LOW (ref 3.87–5.11)
RDW: 13.5 % (ref 11.5–15.5)
WBC: 9 10*3/uL (ref 4.0–10.5)
nRBC: 0 % (ref 0.0–0.2)

## 2019-11-19 MED ORDER — HYDROMORPHONE HCL 2 MG PO TABS: 1.0000 mg | ORAL_TABLET | ORAL | Status: DC | PRN

## 2019-11-19 MED ORDER — ALPRAZOLAM 0.25 MG PO TABS
0.2500 mg | ORAL_TABLET | Freq: Three times a day (TID) | ORAL | Status: DC | PRN
  Administered 2019-11-19 (×2): 0.25 mg via ORAL
  Filled 2019-11-19 (×2): qty 1

## 2019-11-19 MED ORDER — SUMATRIPTAN SUCCINATE 25 MG PO TABS
25.0000 mg | ORAL_TABLET | ORAL | Status: DC | PRN
  Administered 2019-11-19: 25 mg via ORAL
  Filled 2019-11-19 (×2): qty 1

## 2019-11-19 MED ORDER — HYDROMORPHONE HCL 2 MG PO TABS
1.0000 mg | ORAL_TABLET | ORAL | Status: DC | PRN
  Administered 2019-11-19: 1 mg via ORAL
  Filled 2019-11-19: qty 1

## 2019-11-19 MED ORDER — SUMATRIPTAN SUCCINATE 25 MG PO TABS: 25.0000 mg | ORAL_TABLET | ORAL | 0 refills | Status: DC | PRN

## 2019-11-19 MED ORDER — SALINE SPRAY 0.65 % NA SOLN
1.0000 | NASAL | Status: DC | PRN
  Administered 2019-11-19: 1 via NASAL
  Filled 2019-11-19: qty 44

## 2019-11-19 MED ORDER — HYDROMORPHONE HCL 2 MG PO TABS: 1.0000 mg | ORAL_TABLET | ORAL | 0 refills | Status: DC | PRN

## 2019-11-19 MED ORDER — LORATADINE 10 MG PO TABS
10.0000 mg | ORAL_TABLET | Freq: Every day | ORAL | Status: DC
  Administered 2019-11-19: 10 mg via ORAL
  Filled 2019-11-19: qty 1

## 2019-11-19 MED ORDER — BUTORPHANOL TARTRATE 1 MG/ML IJ SOLN: 1.0000 mg | INTRAMUSCULAR | Status: DC | PRN

## 2019-11-19 MED ORDER — PANTOPRAZOLE SODIUM 40 MG IV SOLR
40.0000 mg | Freq: Once | INTRAVENOUS | Status: AC
  Administered 2019-11-19: 40 mg via INTRAVENOUS
  Filled 2019-11-19: qty 40

## 2019-11-19 NOTE — Progress Notes (Signed)
Overnight, RN reports pt feeling very anxious. Patient reports the same. She has been taking phenergan for the past few months on a regular basis at home. She reports she does also take xanax at home prn. She ate and felt better. HA returned a few hours later. She got oxycodone which made her feel too jittery and requests PO dialudid instead. Dilaudid helped the most and just one dose at 4am. Reports HA is now back and 6/10. HA is worse when sits up. No vision changes. RUQ completely resolved. No overt emesis. Tolerating PO, about to eat breakfast.  Good FM overnight. About to have NST this AM  AFVSS A&O, conversive NWOB RR Abd soft, nontender throughout, nondistended, gravid   A/P: 44 yo w/persistent HA responsive to medications but returns once medications wear off. She remains normotensive and without other s/s PIH. Again addressed delivery vs observation. Patient and husband strongly desire to make it to 37 weeks and would like to try imitrex.   - MFM growth yesterday WNL and goof fluid/FM - PO dilaudid instead of oxycodone.   - protonix for GERD  (takes omeprazole at home) - home xanax ordered - cont phenergan prn - ctm BS, pp WNL. Fasting unable to be done s/s drinking gingerale all evening for hydration. Encougared her to continue to hydrate.  - continue diet - NST BID    Rosie Fate MD

## 2019-11-19 NOTE — Discharge Summary (Signed)
Obstetric Discharge Summary Reason for Admission: Headache 44 yo w/persistent HA responsive to medications with vision changes and RUQ pain. She was given 6g mag loading dose. PIH work up was done and negative and per MFM Mag was discontinued. CT was negative. HA waxed and waned. RUQ pain resolved completely. Growth [place] and fluid were normal. She ultimately had complete resolution with her HA with imitrex.  She remains normotensive and without other s/s PIH. Plan for her to follow up in the office on Monday and consider IOL at 37 wga or sooner if HA returns. IF HA does not return, will induce at 39 wga. MFM was consulted and agrees with this plan.   Hemoglobin  Date Value Ref Range Status  11/19/2019 10.6 (L) 12.0 - 15.0 g/dL Final   HCT  Date Value Ref Range Status  11/19/2019 33.6 (L) 36.0 - 46.0 % Final    Physical Exam:  General: alert, cooperative and appears stated age  CV: RR Pulm: NWOB Abd: soft, gravid, nondistended, nontender throughout Neuro: normal reflexes. CN 2-12 grossly intact  Discharge Diagnoses: Headache  Discharge Information: Date: 11/19/2019 Activity: unrestricted Diet: routine  Diabetic Medications: PNV and po dilaudid, imitrex Condition: stable Instructions: refer to practice specific booklet Discharge to: home

## 2019-11-21 DIAGNOSIS — O99891 Other specified diseases and conditions complicating pregnancy: Secondary | ICD-10-CM | POA: Diagnosis not present

## 2019-11-22 ENCOUNTER — Other Ambulatory Visit (HOSPITAL_COMMUNITY)
Admission: RE | Admit: 2019-11-22 | Discharge: 2019-11-22 | Disposition: A | Discharge status: Home / Self Care | Payer: BC Managed Care – PPO | Source: Ambulatory Visit | Attending: Obstetrics and Gynecology | Admitting: Obstetrics and Gynecology

## 2019-11-22 ENCOUNTER — Telehealth (HOSPITAL_COMMUNITY): Payer: Self-pay | Admitting: *Deleted

## 2019-11-22 DIAGNOSIS — Z0542 Observation and evaluation of newborn for suspected metabolic condition ruled out: Secondary | ICD-10-CM | POA: Diagnosis not present

## 2019-11-22 DIAGNOSIS — F410 Panic disorder [episodic paroxysmal anxiety] without agoraphobia: Secondary | ICD-10-CM | POA: Diagnosis not present

## 2019-11-22 DIAGNOSIS — Z3A00 Weeks of gestation of pregnancy not specified: Secondary | ICD-10-CM | POA: Diagnosis not present

## 2019-11-22 DIAGNOSIS — Z833 Family history of diabetes mellitus: Secondary | ICD-10-CM | POA: Diagnosis not present

## 2019-11-22 DIAGNOSIS — Z20822 Contact with and (suspected) exposure to covid-19: Secondary | ICD-10-CM | POA: Diagnosis not present

## 2019-11-22 DIAGNOSIS — Z01812 Encounter for preprocedural laboratory examination: Secondary | ICD-10-CM | POA: Insufficient documentation

## 2019-11-22 DIAGNOSIS — O411230 Chorioamnionitis, third trimester, not applicable or unspecified: Secondary | ICD-10-CM | POA: Diagnosis not present

## 2019-11-22 DIAGNOSIS — Z23 Encounter for immunization: Secondary | ICD-10-CM | POA: Diagnosis not present

## 2019-11-22 DIAGNOSIS — O99344 Other mental disorders complicating childbirth: Secondary | ICD-10-CM | POA: Diagnosis not present

## 2019-11-22 DIAGNOSIS — O24420 Gestational diabetes mellitus in childbirth, diet controlled: Secondary | ICD-10-CM | POA: Diagnosis not present

## 2019-11-22 DIAGNOSIS — M21332 Wrist drop, left wrist: Secondary | ICD-10-CM | POA: Diagnosis not present

## 2019-11-22 DIAGNOSIS — Z888 Allergy status to other drugs, medicaments and biological substances status: Secondary | ICD-10-CM | POA: Diagnosis not present

## 2019-11-22 DIAGNOSIS — R519 Headache, unspecified: Secondary | ICD-10-CM | POA: Diagnosis not present

## 2019-11-22 DIAGNOSIS — Z3A36 36 weeks gestation of pregnancy: Secondary | ICD-10-CM | POA: Diagnosis not present

## 2019-11-22 DIAGNOSIS — Z87891 Personal history of nicotine dependence: Secondary | ICD-10-CM | POA: Diagnosis not present

## 2019-11-22 DIAGNOSIS — O26893 Other specified pregnancy related conditions, third trimester: Secondary | ICD-10-CM | POA: Diagnosis not present

## 2019-11-22 DIAGNOSIS — O24429 Gestational diabetes mellitus in childbirth, unspecified control: Secondary | ICD-10-CM | POA: Diagnosis not present

## 2019-11-22 DIAGNOSIS — Z3A37 37 weeks gestation of pregnancy: Secondary | ICD-10-CM | POA: Diagnosis not present

## 2019-11-22 LAB — SARS CORONAVIRUS 2 (TAT 6-24 HRS): SARS Coronavirus 2: NEGATIVE

## 2019-11-22 LAB — OB RESULTS CONSOLE GBS: GBS: NEGATIVE

## 2019-11-22 NOTE — Telephone Encounter (Signed)
Preadmission screen  

## 2019-11-23 ENCOUNTER — Encounter (HOSPITAL_COMMUNITY): Payer: Self-pay | Admitting: Obstetrics & Gynecology

## 2019-11-23 ENCOUNTER — Inpatient Hospital Stay (HOSPITAL_COMMUNITY): Payer: BC Managed Care – PPO

## 2019-11-23 ENCOUNTER — Inpatient Hospital Stay (HOSPITAL_COMMUNITY)
Admission: AD | Admit: 2019-11-23 | Discharge: 2019-11-26 | DRG: 805 | Disposition: A | Discharge status: Home / Self Care | Payer: BC Managed Care – PPO | Attending: Obstetrics and Gynecology | Admitting: Obstetrics and Gynecology

## 2019-11-23 ENCOUNTER — Inpatient Hospital Stay (HOSPITAL_COMMUNITY): Payer: BC Managed Care – PPO | Admitting: Anesthesiology

## 2019-11-23 DIAGNOSIS — O26893 Other specified pregnancy related conditions, third trimester: Secondary | ICD-10-CM | POA: Diagnosis present

## 2019-11-23 DIAGNOSIS — Z3A37 37 weeks gestation of pregnancy: Secondary | ICD-10-CM | POA: Diagnosis not present

## 2019-11-23 DIAGNOSIS — Z3A36 36 weeks gestation of pregnancy: Secondary | ICD-10-CM | POA: Diagnosis not present

## 2019-11-23 DIAGNOSIS — Z888 Allergy status to other drugs, medicaments and biological substances status: Secondary | ICD-10-CM

## 2019-11-23 DIAGNOSIS — Z3490 Encounter for supervision of normal pregnancy, unspecified, unspecified trimester: Secondary | ICD-10-CM

## 2019-11-23 DIAGNOSIS — Z20822 Contact with and (suspected) exposure to covid-19: Secondary | ICD-10-CM | POA: Diagnosis present

## 2019-11-23 DIAGNOSIS — O24429 Gestational diabetes mellitus in childbirth, unspecified control: Secondary | ICD-10-CM | POA: Diagnosis not present

## 2019-11-23 DIAGNOSIS — R519 Headache, unspecified: Secondary | ICD-10-CM | POA: Diagnosis present

## 2019-11-23 DIAGNOSIS — O99344 Other mental disorders complicating childbirth: Secondary | ICD-10-CM | POA: Diagnosis present

## 2019-11-23 DIAGNOSIS — O24420 Gestational diabetes mellitus in childbirth, diet controlled: Secondary | ICD-10-CM | POA: Diagnosis present

## 2019-11-23 DIAGNOSIS — O411230 Chorioamnionitis, third trimester, not applicable or unspecified: Secondary | ICD-10-CM | POA: Diagnosis present

## 2019-11-23 DIAGNOSIS — Z87891 Personal history of nicotine dependence: Secondary | ICD-10-CM | POA: Diagnosis not present

## 2019-11-23 DIAGNOSIS — F410 Panic disorder [episodic paroxysmal anxiety] without agoraphobia: Secondary | ICD-10-CM | POA: Diagnosis present

## 2019-11-23 DIAGNOSIS — H539 Unspecified visual disturbance: Secondary | ICD-10-CM

## 2019-11-23 LAB — CBC
HCT: 36.3 % (ref 36.0–46.0)
HCT: 33.6 % — ABNORMAL LOW (ref 36.0–46.0)
Hemoglobin: 11.6 g/dL — ABNORMAL LOW (ref 12.0–15.0)
Hemoglobin: 10.9 g/dL — ABNORMAL LOW (ref 12.0–15.0)
MCH: 28.1 pg (ref 26.0–34.0)
MCH: 28.2 pg (ref 26.0–34.0)
MCHC: 32 g/dL (ref 30.0–36.0)
MCHC: 32.4 g/dL (ref 30.0–36.0)
MCV: 87.9 fL (ref 80.0–100.0)
MCV: 87 fL (ref 80.0–100.0)
Platelets: 196 10*3/uL (ref 150–400)
Platelets: 175 10*3/uL (ref 150–400)
RBC: 4.13 MIL/uL (ref 3.87–5.11)
RBC: 3.86 MIL/uL — ABNORMAL LOW (ref 3.87–5.11)
RDW: 13.3 % (ref 11.5–15.5)
RDW: 13.3 % (ref 11.5–15.5)
WBC: 11.2 10*3/uL — ABNORMAL HIGH (ref 4.0–10.5)
WBC: 9.4 10*3/uL (ref 4.0–10.5)
nRBC: 0 % (ref 0.0–0.2)
nRBC: 0 % (ref 0.0–0.2)

## 2019-11-23 LAB — COMPREHENSIVE METABOLIC PANEL
ALT: 11 U/L (ref 0–44)
AST: 31 U/L (ref 15–41)
Albumin: 2.4 g/dL — ABNORMAL LOW (ref 3.5–5.0)
Alkaline Phosphatase: 155 U/L — ABNORMAL HIGH (ref 38–126)
Anion gap: 8 (ref 5–15)
BUN: 8 mg/dL (ref 6–20)
CO2: 21 mmol/L — ABNORMAL LOW (ref 22–32)
Calcium: 9.4 mg/dL (ref 8.9–10.3)
Chloride: 108 mmol/L (ref 98–111)
Creatinine, Ser: 0.48 mg/dL (ref 0.44–1.00)
GFR calc Af Amer: >60 mL/min (ref >60)
GFR calc non Af Amer: >60 mL/min (ref >60)
Glucose, Bld: 108 mg/dL — ABNORMAL HIGH (ref 70–99)
Potassium: 4.5 mmol/L (ref 3.5–5.1)
Sodium: 137 mmol/L (ref 135–145)
Total Bilirubin: 0.5 mg/dL (ref 0.3–1.2)
Total Protein: 4.9 g/dL — ABNORMAL LOW (ref 6.5–8.1)

## 2019-11-23 LAB — RPR: RPR Ser Ql: NONREACTIVE

## 2019-11-23 LAB — GLUCOSE, CAPILLARY
Glucose-Capillary: 98 mg/dL (ref 70–99)
Glucose-Capillary: 79 mg/dL (ref 70–99)
Glucose-Capillary: 105 mg/dL — ABNORMAL HIGH (ref 70–99)

## 2019-11-23 LAB — TYPE AND SCREEN
ABO/RH(D): O POS
Antibody Screen: NEGATIVE

## 2019-11-23 MED ORDER — HYDROXYZINE HCL 50 MG PO TABS
50.0000 mg | ORAL_TABLET | Freq: Four times a day (QID) | ORAL | Status: DC | PRN
  Administered 2019-11-23 (×2): 50 mg via ORAL
  Filled 2019-11-23 (×2): qty 1

## 2019-11-23 MED ORDER — MISOPROSTOL 25 MCG QUARTER TABLET
25.0000 ug | ORAL_TABLET | ORAL | Status: DC | PRN
  Administered 2019-11-23 (×2): 25 ug via VAGINAL
  Filled 2019-11-23 (×2): qty 1

## 2019-11-23 MED ORDER — EPHEDRINE 5 MG/ML INJ: 10.0000 mg | INTRAVENOUS | Status: DC | PRN

## 2019-11-23 MED ORDER — LACTATED RINGERS IV SOLN
500.0000 mL | INTRAVENOUS | Status: DC | PRN
  Administered 2019-11-23 – 2019-11-24 (×3): 500 mL via INTRAVENOUS

## 2019-11-23 MED ORDER — OXYCODONE-ACETAMINOPHEN 5-325 MG PO TABS: 2.0000 | ORAL_TABLET | ORAL | Status: DC | PRN

## 2019-11-23 MED ORDER — LACTATED RINGERS IV SOLN
500.0000 mL | Freq: Once | INTRAVENOUS | Status: AC
  Administered 2019-11-23: 500 mL via INTRAVENOUS

## 2019-11-23 MED ORDER — FENTANYL-BUPIVACAINE-NACL 0.5-0.125-0.9 MG/250ML-% EP SOLN
12.0000 mL/h | EPIDURAL | Status: DC | PRN
  Administered 2019-11-24: 12 mL/h via EPIDURAL
  Filled 2019-11-23 (×2): qty 250

## 2019-11-23 MED ORDER — FENTANYL CITRATE (PF) 100 MCG/2ML IJ SOLN
50.0000 ug | INTRAMUSCULAR | Status: DC | PRN
  Administered 2019-11-23: 50 ug via INTRAVENOUS
  Administered 2019-11-23 (×2): 100 ug via INTRAVENOUS
  Administered 2019-11-23: 50 ug via INTRAVENOUS
  Administered 2019-11-23: 100 ug via INTRAVENOUS
  Filled 2019-11-23 (×5): qty 2

## 2019-11-23 MED ORDER — LIDOCAINE-EPINEPHRINE (PF) 2 %-1:200000 IJ SOLN
INTRAMUSCULAR | Status: DC | PRN
  Administered 2019-11-23 (×2): 2 mL via EPIDURAL

## 2019-11-23 MED ORDER — LACTATED RINGERS IV SOLN: INTRAVENOUS | Status: DC

## 2019-11-23 MED ORDER — ONDANSETRON HCL 4 MG/2ML IJ SOLN
4.0000 mg | Freq: Four times a day (QID) | INTRAMUSCULAR | Status: DC | PRN
  Administered 2019-11-23 – 2019-11-24 (×2): 4 mg via INTRAVENOUS
  Filled 2019-11-23 (×2): qty 2

## 2019-11-23 MED ORDER — LIDOCAINE HCL (PF) 1 % IJ SOLN
30.0000 mL | INTRAMUSCULAR | Status: DC | PRN
  Filled 2019-11-23: qty 30

## 2019-11-23 MED ORDER — SODIUM CHLORIDE (PF) 0.9 % IJ SOLN
INTRAMUSCULAR | Status: DC | PRN
  Administered 2019-11-23: 12 mL/h via EPIDURAL

## 2019-11-23 MED ORDER — OXYTOCIN BOLUS FROM INFUSION
500.0000 mL | Freq: Once | INTRAVENOUS | Status: AC
  Administered 2019-11-24: 500 mL via INTRAVENOUS

## 2019-11-23 MED ORDER — OXYCODONE-ACETAMINOPHEN 5-325 MG PO TABS: 1.0000 | ORAL_TABLET | ORAL | Status: DC | PRN

## 2019-11-23 MED ORDER — ZOLPIDEM TARTRATE 5 MG PO TABS
5.0000 mg | ORAL_TABLET | Freq: Every evening | ORAL | Status: DC | PRN
  Administered 2019-11-23: 5 mg via ORAL
  Filled 2019-11-23: qty 1

## 2019-11-23 MED ORDER — DIPHENHYDRAMINE HCL 50 MG/ML IJ SOLN
12.5000 mg | INTRAMUSCULAR | Status: AC | PRN
  Administered 2019-11-23 – 2019-11-24 (×2): 12.5 mg via INTRAVENOUS
  Filled 2019-11-23 (×2): qty 1

## 2019-11-23 MED ORDER — PHENYLEPHRINE 40 MCG/ML (10ML) SYRINGE FOR IV PUSH (FOR BLOOD PRESSURE SUPPORT)
80.0000 ug | PREFILLED_SYRINGE | INTRAVENOUS | Status: DC | PRN
  Administered 2019-11-23: 80 ug via INTRAVENOUS

## 2019-11-23 MED ORDER — SOD CITRATE-CITRIC ACID 500-334 MG/5ML PO SOLN: 30.0000 mL | ORAL | Status: DC | PRN

## 2019-11-23 MED ORDER — ACETAMINOPHEN 325 MG PO TABS
650.0000 mg | ORAL_TABLET | ORAL | Status: DC | PRN
  Administered 2019-11-23 – 2019-11-24 (×4): 650 mg via ORAL
  Filled 2019-11-23 (×5): qty 2

## 2019-11-23 MED ORDER — DIPHENHYDRAMINE HCL 50 MG/ML IJ SOLN
INTRAMUSCULAR | Status: AC
  Administered 2019-11-23: 12.5 mg via INTRAVENOUS
  Filled 2019-11-23: qty 1

## 2019-11-23 MED ORDER — OXYTOCIN 40 UNITS IN NORMAL SALINE INFUSION - SIMPLE MED: 2.5000 [IU]/h | INTRAVENOUS | Status: DC

## 2019-11-23 MED ORDER — TERBUTALINE SULFATE 1 MG/ML IJ SOLN: 0.2500 mg | Freq: Once | INTRAMUSCULAR | Status: DC | PRN

## 2019-11-23 MED ORDER — MISOPROSTOL 25 MCG QUARTER TABLET
ORAL_TABLET | ORAL | Status: AC
  Administered 2019-11-23: 25 ug via VAGINAL
  Filled 2019-11-23: qty 1

## 2019-11-23 MED ORDER — PHENYLEPHRINE 40 MCG/ML (10ML) SYRINGE FOR IV PUSH (FOR BLOOD PRESSURE SUPPORT)
80.0000 ug | PREFILLED_SYRINGE | INTRAVENOUS | Status: DC | PRN
  Filled 2019-11-23: qty 10

## 2019-11-23 NOTE — Progress Notes (Signed)
Lori Marshall is a 44 y.o. G4P0030 at [redacted]w[redacted]d by ultrasound admitted for induction of labor due to severe headaches.  Subjective: Comfortable with CLEA  Objective: BP (!) 110/59   Pulse 98   Temp 99.1 F (37.3 C) (Axillary)   Resp 16   Ht 5\' 6"  (1.676 m)   Wt 90.5 kg   SpO2 98%   BMI 32.20 kg/m  I/O last 3 completed shifts: In: -  Out: 925 [Urine:925] No intake/output data recorded.  FHT:  FHR: 190 bpm, variability: moderate,  accelerations:  Present,  decelerations:  Absent UC:   regular, every 2-3 minutes SVE:   Dilation: 1 Effacement (%): 50 Station: Ballotable Exam by:: Dr. [email or web address]  FB still in; traction applied  Labs: Lab Results  Component Value Date   WBC 9.4 11/23/2019   HGB 10.9 (L) 11/23/2019   HCT 33.6 (L) 11/23/2019   MCV 87.0 11/23/2019   PLT 175 11/23/2019    Assessment / Plan: Induction of labor for intractable headaches  Labor: Progressing normally Preeclampsia:  n/a Fetal Wellbeing:  Category II.  Fetal tachycardia up to 210s.  Patient repositioned, IV fluid bolus and Tylenol given.  No maternal fever.  Will closely monitor.   Pain Control:  Epidural I/D:  n/a Anticipated MOD:  NSVD  11/25/2019 11/23/2019, 7:17 PM

## 2019-11-23 NOTE — Progress Notes (Signed)
Lori Marshall is a 44 y.o. G4P0030 at [redacted]w[redacted]d by ultrasound admitted for induction of labor due to severe headaches.  Subjective: Feeling painful CTX with more frequency.  S/P fentanyl.  Active FM.  Objective: BP 128/85   Pulse 76   Temp 98.2 F (36.8 C) (Oral)   Resp 17   Ht 5\' 6"  (1.676 m)   Wt 90.5 kg   BMI 32.20 kg/m  No intake/output data recorded. No intake/output data recorded.  FHT:  FHR: 125 bpm, variability: moderate,  accelerations:  Present,  decelerations:  Absent UC:   regular, every 2 minutes SVE:   Dilation: 1 Effacement (%): 50 Station: Ballotable Exam by:: Dr. [email or web address] catheter placed  Labs: Lab Results  Component Value Date   WBC 9.4 11/23/2019   HGB 10.9 (L) 11/23/2019   HCT 33.6 (L) 11/23/2019   MCV 87.0 11/23/2019   PLT 175 11/23/2019    Assessment / Plan: Induction of labor due to severe headaches  Labor: Progressing normally, cook catheter in place; plan pitocin if CTX space out.   Preeclampsia:  n/a Fetal Wellbeing:  Category I Pain Control:  IV pain meds I/D:  n/a Anticipated MOD:  NSVD  11/25/2019 11/23/2019, 1:31 PM

## 2019-11-23 NOTE — H&P (Signed)
Lori Marshall is a 44 y.o. female G4P0030 at [redacted]w[redacted]d presenting for medical IOL secondary to severe, intractable headache.  The patient was admitted 3/19-20 for pre-eclampsia evaluation after presentation with RUQ pain and severe headache.  Magnesium sulfate was given and head CT done and returned negative. Patient unable to receive BMZ because of allergy to steroids. Pre-eclampsia work up was negative.  MFM consult (Dr. Annamaria Boots) with plan to induce should HA return.  Patient had follow up in the office on Monday and complained of recurrence in HAs.  She was admitted last night for induction and received her second dose of misoprostol at 0450 today.  Currently, she has no headache or RUQ pain.  She feels mild CTX and active FM.  This is an IVF pregnancy and with PGS (female).  Normal fetal ECHO done.  Patient has A1DM with good control.  Patient has h/o severe anxiety and has taken no medications this pregnancy.  She has h/o suicide attempt in 2014 with xanax and atenolol. GBS negative.   OB History    Gravida  4   Para      Term      Preterm      AB  3   Living  0     SAB  2   TAB  1   Ectopic      Multiple      Live Births             Past Medical History:  Diagnosis Date  . Anxiety   . Depression   . Depressive disorder, not elsewhere classified   . Diarrhea   . Esophageal reflux   . Gestational diabetes   . Hypothyroid   . Irritable bowel syndrome   . Migraine, unspecified, without mention of intractable migraine without mention of status migrainosus   . Panic disorder without agoraphobia   . Vomiting alone    Past Surgical History:  Procedure Laterality Date  . APPENDECTOMY    . CHOLECYSTECTOMY  08/19/2011   Procedure: LAPAROSCOPIC CHOLECYSTECTOMY WITH INTRAOPERATIVE CHOLANGIOGRAM;  Surgeon: Earnstine Regal, MD;  Location: [facility];  Service: General;  Laterality: N/A;  . COLON SURGERY    . WISDOM TOOTH EXTRACTION     Family History: family history includes  Colon polyps in her father; Diabetes in her father; Melanoma in her father. Social History:  reports that she quit smoking about 14 months ago. Her smoking use included cigarettes. She has a 2.50 pack-year smoking history. She has never used smokeless tobacco. She reports current alcohol use. She reports that she does not use drugs.     Maternal Diabetes: Yes:  Diabetes Type:  Diet controlled Genetic Screening: Normal Maternal Ultrasounds/Referrals: Normal Fetal Ultrasounds or other Referrals:  Fetal echo Maternal Substance Abuse:  No Significant Maternal Medications:  None Significant Maternal Lab Results:  Group B Strep negative Other Comments:  None  Review of Systems Maternal Medical History:  Fetal activity: Perceived fetal activity is normal.   Last perceived fetal movement was within the past hour.    Prenatal complications: no prenatal complications Prenatal Complications - Diabetes: gestational. Diabetes is managed by diet.      Dilation: Fingertip Effacement (%): Thick Station: Ballotable Exam by:: Lesia Sago, RN  Blood pressure 125/66, pulse 70, temperature 98.7 F (37.1 C), temperature source Oral, resp. rate 17, height 5\' 6"  (1.676 m), weight 90.5 kg. Maternal Exam:  Uterine Assessment: Contraction strength is mild.  Contraction frequency is rare.   Abdomen: Patient  reports no abdominal tenderness. Fundal height is c/w dates.   Estimated fetal weight is 6#8.       Fetal Exam Fetal Monitor Review: Baseline rate: 135.  Variability: moderate (6-25 bpm).   Pattern: no decelerations and accelerations present.    Fetal State Assessment: Category I - tracings are normal.     Physical Exam  Constitutional: She is oriented to person, place, and time. She appears well-developed and well-nourished.  Respiratory: Effort normal.  GI: Soft. There is no rebound and no guarding.  Musculoskeletal:        General: Normal range of motion.  Neurological: She is alert and  oriented to person, place, and time.  Skin: Skin is warm and dry.  Psychiatric: She has a normal mood and affect. Her behavior is normal.    Prenatal labs: ABO, Rh: --/--/O POS (03/24 0035) Antibody: NEG (03/24 0035) Rubella: Immune (09/17 0000) RPR: Nonreactive (09/17 0000)  HBsAg: Negative (09/17 0000)  HIV: Non-reactive (01/12 0000)  GBS: Negative/-- (03/23 0000)   Assessment/Plan: 76VT [id number] at [redacted]w[redacted]d for medical induction of labor secondary to intractable headaches -Continue with VMP -Headache-none currently; treat prn -Epidural when ready -A1DM-CBG q 2 h -Anxiety-Atarax prn -Anticipate NSVD   Mitchel Honour 11/23/2019, 9:00 AM

## 2019-11-23 NOTE — Anesthesia Preprocedure Evaluation (Addendum)
Anesthesia Evaluation  Patient identified by MRN, date of birth, ID band Patient awake    Reviewed: Allergy & Precautions, NPO status , Patient's Chart, lab work & pertinent test results  Airway Mallampati: II  TM Distance: >3 FB Neck ROM: Full    Dental no notable dental hx.    Pulmonary neg pulmonary ROS, former smoker,    Pulmonary exam normal breath sounds clear to auscultation       Cardiovascular negative cardio ROS Normal cardiovascular exam Rhythm:Regular Rate:Normal     Neuro/Psych  Headaches, Anxiety Depression negative psych ROS   GI/Hepatic Neg liver ROS, GERD  ,  Endo/Other  negative endocrine ROSdiabetes, Gestational  Renal/GU negative Renal ROS  negative genitourinary   Musculoskeletal negative musculoskeletal ROS (+)   Abdominal   Peds  Hematology negative hematology ROS (+)   Anesthesia Other Findings IOL for severe intractable headache Documented allergy to novocain (developed palpitations at dentist). Says lidocaine is ok.   Reproductive/Obstetrics (+) Pregnancy                             Anesthesia Physical Anesthesia Plan  ASA: III  Anesthesia Plan: Epidural   Post-op Pain Management:    Induction:   PONV Risk Score and Plan: Treatment may vary due to age or medical condition  Airway Management Planned: Natural Airway  Additional Equipment:   Intra-op Plan:   Post-operative Plan:   Informed Consent: I have reviewed the patients History and Physical, chart, labs and discussed the procedure including the risks, benefits and alternatives for the proposed anesthesia with the patient or authorized representative who has indicated his/her understanding and acceptance.       Plan Discussed with: Anesthesiologist  Anesthesia Plan Comments: (Patient identified. Risks, benefits, options discussed with patient including but not limited to bleeding, infection,  nerve damage, paralysis, failed block, incomplete pain control, headache, blood pressure changes, nausea, vomiting, reactions to medication, itching, and post partum back pain. Confirmed with bedside nurse the patient's most recent platelet count. Confirmed with the patient that they are not taking any anticoagulation, have any bleeding history or any family history of bleeding disorders. Patient expressed understanding and wishes to proceed. All questions were answered. )        Anesthesia Quick Evaluation

## 2019-11-24 ENCOUNTER — Encounter (HOSPITAL_COMMUNITY): Payer: Self-pay | Admitting: Obstetrics & Gynecology

## 2019-11-24 DIAGNOSIS — Z833 Family history of diabetes mellitus: Secondary | ICD-10-CM | POA: Diagnosis not present

## 2019-11-24 DIAGNOSIS — Z3A00 Weeks of gestation of pregnancy not specified: Secondary | ICD-10-CM | POA: Diagnosis not present

## 2019-11-24 DIAGNOSIS — M21332 Wrist drop, left wrist: Secondary | ICD-10-CM | POA: Diagnosis not present

## 2019-11-24 DIAGNOSIS — Z0542 Observation and evaluation of newborn for suspected metabolic condition ruled out: Secondary | ICD-10-CM | POA: Diagnosis not present

## 2019-11-24 DIAGNOSIS — O26893 Other specified pregnancy related conditions, third trimester: Secondary | ICD-10-CM | POA: Diagnosis not present

## 2019-11-24 DIAGNOSIS — Z23 Encounter for immunization: Secondary | ICD-10-CM | POA: Diagnosis not present

## 2019-11-24 DIAGNOSIS — R519 Headache, unspecified: Secondary | ICD-10-CM | POA: Diagnosis not present

## 2019-11-24 LAB — GLUCOSE, CAPILLARY
Glucose-Capillary: 103 mg/dL — ABNORMAL HIGH (ref 70–99)
Glucose-Capillary: 81 mg/dL (ref 70–99)
Glucose-Capillary: 94 mg/dL (ref 70–99)
Glucose-Capillary: 101 mg/dL — ABNORMAL HIGH (ref 70–99)
Glucose-Capillary: 87 mg/dL (ref 70–99)
Glucose-Capillary: 105 mg/dL — ABNORMAL HIGH (ref 70–99)

## 2019-11-24 MED ORDER — TERBUTALINE SULFATE 1 MG/ML IJ SOLN: 0.2500 mg | Freq: Once | INTRAMUSCULAR | Status: DC | PRN

## 2019-11-24 MED ORDER — BENZOCAINE-MENTHOL 20-0.5 % EX AERO
1.0000 "application " | INHALATION_SPRAY | CUTANEOUS | Status: DC | PRN
  Administered 2019-11-25: 1 via TOPICAL
  Filled 2019-11-24: qty 56

## 2019-11-24 MED ORDER — WITCH HAZEL-GLYCERIN EX PADS
1.0000 "application " | MEDICATED_PAD | CUTANEOUS | Status: DC | PRN
  Administered 2019-11-25: 1 via TOPICAL

## 2019-11-24 MED ORDER — COCONUT OIL OIL: 1.0000 "application " | TOPICAL_OIL | Status: DC | PRN

## 2019-11-24 MED ORDER — ACETAMINOPHEN 325 MG PO TABS
325.0000 mg | ORAL_TABLET | Freq: Once | ORAL | Status: AC
  Administered 2019-11-24: 325 mg via ORAL

## 2019-11-24 MED ORDER — LORATADINE 10 MG PO TABS
10.0000 mg | ORAL_TABLET | Freq: Every day | ORAL | Status: DC
  Administered 2019-11-25 – 2019-11-26 (×2): 10 mg via ORAL
  Filled 2019-11-24 (×2): qty 1

## 2019-11-24 MED ORDER — SODIUM CHLORIDE 0.9 % IV SOLN
2.0000 g | Freq: Four times a day (QID) | INTRAVENOUS | Status: DC
  Administered 2019-11-24 (×2): 2 g via INTRAVENOUS
  Filled 2019-11-24 (×4): qty 2000

## 2019-11-24 MED ORDER — ACETAMINOPHEN 500 MG PO TABS
1000.0000 mg | ORAL_TABLET | Freq: Once | ORAL | Status: AC
  Administered 2019-11-24: 1000 mg via ORAL
  Filled 2019-11-24: qty 2

## 2019-11-24 MED ORDER — OXYCODONE HCL 5 MG PO TABS: 10.0000 mg | ORAL_TABLET | ORAL | Status: DC | PRN

## 2019-11-24 MED ORDER — DIPHENHYDRAMINE HCL 25 MG PO CAPS: 25.0000 mg | ORAL_CAPSULE | Freq: Four times a day (QID) | ORAL | Status: DC | PRN

## 2019-11-24 MED ORDER — DIBUCAINE (PERIANAL) 1 % EX OINT
1.0000 "application " | TOPICAL_OINTMENT | CUTANEOUS | Status: DC | PRN
  Administered 2019-11-25: 1 via RECTAL
  Filled 2019-11-24: qty 28

## 2019-11-24 MED ORDER — OXYTOCIN 40 UNITS IN NORMAL SALINE INFUSION - SIMPLE MED
1.0000 m[IU]/min | INTRAVENOUS | Status: DC
  Administered 2019-11-24: 2 m[IU]/min via INTRAVENOUS
  Filled 2019-11-24: qty 1000

## 2019-11-24 MED ORDER — IBUPROFEN 600 MG PO TABS
600.0000 mg | ORAL_TABLET | Freq: Four times a day (QID) | ORAL | Status: DC
  Administered 2019-11-25 – 2019-11-26 (×6): 600 mg via ORAL
  Filled 2019-11-24 (×6): qty 1

## 2019-11-24 MED ORDER — SENNOSIDES-DOCUSATE SODIUM 8.6-50 MG PO TABS
2.0000 | ORAL_TABLET | ORAL | Status: DC
  Filled 2019-11-24 (×2): qty 2

## 2019-11-24 MED ORDER — OXYCODONE HCL 5 MG PO TABS: 5.0000 mg | ORAL_TABLET | ORAL | Status: DC | PRN

## 2019-11-24 MED ORDER — SIMETHICONE 80 MG PO CHEW: 80.0000 mg | CHEWABLE_TABLET | ORAL | Status: DC | PRN

## 2019-11-24 MED ORDER — PRENATAL MULTIVITAMIN CH
1.0000 | ORAL_TABLET | Freq: Every day | ORAL | Status: DC
  Administered 2019-11-25: 1 via ORAL
  Filled 2019-11-24: qty 1

## 2019-11-24 MED ORDER — TETANUS-DIPHTH-ACELL PERTUSSIS 5-2.5-18.5 LF-MCG/0.5 IM SUSP: 0.5000 mL | Freq: Once | INTRAMUSCULAR | Status: DC

## 2019-11-24 MED ORDER — FAMOTIDINE IN NACL 20-0.9 MG/50ML-% IV SOLN
20.0000 mg | Freq: Once | INTRAVENOUS | Status: AC
  Administered 2019-11-24: 20 mg via INTRAVENOUS
  Filled 2019-11-24: qty 50

## 2019-11-24 MED ORDER — CLINDAMYCIN PHOSPHATE 900 MG/50ML IV SOLN
900.0000 mg | Freq: Once | INTRAVENOUS | Status: AC
  Administered 2019-11-24: 900 mg via INTRAVENOUS
  Filled 2019-11-24: qty 50

## 2019-11-24 MED ORDER — ZOLPIDEM TARTRATE 5 MG PO TABS: 5.0000 mg | ORAL_TABLET | Freq: Every evening | ORAL | Status: DC | PRN

## 2019-11-24 MED ORDER — GENTAMICIN SULFATE 40 MG/ML IJ SOLN
5.0000 mg/kg | INTRAVENOUS | Status: DC
  Administered 2019-11-24: 450 mg via INTRAVENOUS
  Filled 2019-11-24: qty 11.25

## 2019-11-24 MED ORDER — ACETAMINOPHEN 325 MG PO TABS
650.0000 mg | ORAL_TABLET | ORAL | Status: DC | PRN
  Administered 2019-11-25 – 2019-11-26 (×3): 650 mg via ORAL
  Filled 2019-11-24 (×3): qty 2

## 2019-11-24 MED ORDER — ONDANSETRON HCL 4 MG/2ML IJ SOLN
4.0000 mg | INTRAMUSCULAR | Status: DC | PRN
  Administered 2019-11-24: 4 mg via INTRAVENOUS
  Filled 2019-11-24: qty 2

## 2019-11-24 MED ORDER — ONDANSETRON HCL 4 MG PO TABS: 4.0000 mg | ORAL_TABLET | ORAL | Status: DC | PRN

## 2019-11-24 NOTE — Progress Notes (Signed)
Lori Marshall is a 44 y.o. G4P0030 at [redacted]w[redacted]d by ultrasound admitted for induction of labor due to severe headaches.  Subjective: Comfortable with CLEA; slept well overnight.  Objective: BP 131/80   Pulse 74   Temp 98.7 F (37.1 C) (Oral)   Resp 16   Ht 5\' 6"  (1.676 m)   Wt 90.5 kg   SpO2 96%   BMI 32.20 kg/m  I/O last 3 completed shifts: In: -  Out: 2725 [Urine:2725] Total I/O In: -  Out: 500 [Urine:500]  FHT:  FHR: 140 bpm, variability: moderate,  accelerations:  Present,  decelerations:  Absent UC:   regular, every 2 minutes SVE:   Dilation: 3 Effacement (%): 80 Station: -2 Exam by:: C Cornetto RN AROM, clear, IUPC placed  Labs: Lab Results  Component Value Date   WBC 9.4 11/23/2019   HGB 10.9 (L) 11/23/2019   HCT 33.6 (L) 11/23/2019   MCV 87.0 11/23/2019   PLT 175 11/23/2019    Assessment / Plan: Induction of labor due to severe headaches,  progressing well on pitocin  Labor: Progressing normally Preeclampsia:  n/a Fetal Wellbeing:  Category I Pain Control:  Epidural I/D:  n/a Anticipated MOD:  NSVD  11/25/2019 11/24/2019, 7:20 AM

## 2019-11-24 NOTE — Progress Notes (Signed)
C/c/0 Push Fetal tachycardia improved

## 2019-11-24 NOTE — Anesthesia Procedure Notes (Signed)
Epidural Patient location during procedure: OB Start time: 11/23/2019 2:05 PM End time: 11/23/2019 2:20 PM  Staffing Anesthesiologist: Elmer Picker, MD Performed: anesthesiologist   Preanesthetic Checklist Completed: patient identified, IV checked, risks and benefits discussed, monitors and equipment checked, pre-op evaluation and timeout performed  Epidural Patient position: sitting Prep: DuraPrep and site prepped and draped Patient monitoring: continuous pulse ox, blood pressure, heart rate and cardiac monitor Approach: midline Location: L3-L4 Injection technique: LOR air  Needle:  Needle type: Tuohy  Needle gauge: 17 G Needle length: 9 cm Needle insertion depth: 6 cm Catheter type: closed end flexible Catheter size: 19 Gauge Catheter at skin depth: 12 cm Test dose: negative  Assessment Sensory level: T8 Events: blood not aspirated, injection not painful, no injection resistance, no paresthesia and negative IV test  Additional Notes Patient identified. Risks/Benefits/Options discussed with patient including but not limited to bleeding, infection, nerve damage, paralysis, failed block, incomplete pain control, headache, blood pressure changes, nausea, vomiting, reactions to medication both or allergic, itching and postpartum back pain. Confirmed with bedside nurse the patient's most recent platelet count. Confirmed with patient that they are not currently taking any anticoagulation, have any bleeding history or any family history of bleeding disorders. Patient expressed understanding and wished to proceed. All questions were answered. Sterile technique was used throughout the entire procedure. Please see nursing notes for vital signs. Test dose was given through epidural catheter and negative prior to continuing to dose epidural or start infusion. Warning signs of high block given to the patient including shortness of breath, tingling/numbness in hands, complete motor block,  or any concerning symptoms with instructions to call for help. Patient was given instructions on fall risk and not to get out of bed. All questions and concerns addressed with instructions to call with any issues or inadequate analgesia.  Reason for block:procedure for pain

## 2019-11-24 NOTE — Progress Notes (Signed)
Doing well. Comf w/cle.  IUPC in place with adequate ctxn Continue pitocin Expecting baby girl.    Rosie Fate MD

## 2019-11-24 NOTE — Progress Notes (Addendum)
Temp 100.9 with fetal tachycardia. Recommend amp/gent for presumed chorioamnionitis. SVE 8/c/+1, DOA.   Rosie Fate MD

## 2019-11-24 NOTE — Progress Notes (Signed)
9cm Fever went down to normal but then increased to 103 once tylenol wore off. Fetal tachycardia but stilll good acces and variability. Redose tylenol and add clindamycin. Push as soon as complete

## 2019-11-25 ENCOUNTER — Encounter (HOSPITAL_COMMUNITY): Payer: Self-pay | Admitting: Obstetrics & Gynecology

## 2019-11-25 DIAGNOSIS — M21332 Wrist drop, left wrist: Secondary | ICD-10-CM | POA: Diagnosis not present

## 2019-11-25 LAB — CBC
HCT: 31.3 % — ABNORMAL LOW (ref 36.0–46.0)
Hemoglobin: 10.3 g/dL — ABNORMAL LOW (ref 12.0–15.0)
MCH: 28.5 pg (ref 26.0–34.0)
MCHC: 32.9 g/dL (ref 30.0–36.0)
MCV: 86.5 fL (ref 80.0–100.0)
Platelets: 159 10*3/uL (ref 150–400)
RBC: 3.62 MIL/uL — ABNORMAL LOW (ref 3.87–5.11)
RDW: 13.4 % (ref 11.5–15.5)
WBC: 17.5 10*3/uL — ABNORMAL HIGH (ref 4.0–10.5)
nRBC: 0 % (ref 0.0–0.2)

## 2019-11-25 MED ORDER — ACYCLOVIR 200 MG PO CAPS
400.0000 mg | ORAL_CAPSULE | Freq: Three times a day (TID) | ORAL | Status: DC
  Administered 2019-11-25 – 2019-11-26 (×3): 400 mg via ORAL
  Filled 2019-11-25 (×5): qty 2

## 2019-11-25 MED ORDER — PANTOPRAZOLE SODIUM 40 MG PO TBEC
40.0000 mg | DELAYED_RELEASE_TABLET | Freq: Every day | ORAL | Status: DC
  Administered 2019-11-25 – 2019-11-26 (×2): 40 mg via ORAL
  Filled 2019-11-25 (×2): qty 1

## 2019-11-25 MED ORDER — TRAMADOL HCL 50 MG PO TABS
50.0000 mg | ORAL_TABLET | Freq: Four times a day (QID) | ORAL | Status: DC | PRN
  Administered 2019-11-25 – 2019-11-26 (×3): 50 mg via ORAL
  Filled 2019-11-25 (×3): qty 1

## 2019-11-25 NOTE — Progress Notes (Signed)
MOB was referred for history of depression/anxiety. * Referral screened out by Clinical Social Worker because none of the following criteria appear to apply: ~ History of anxiety/depression during this pregnancy, or of post-partum depression following prior delivery. ~ Diagnosis of anxiety and/or depression within last 3 years. Per further chart review, MOB diagnosed with depression and anxiety in 2014. Per MOB's chart, MOB also attempted suicide in 2014 with no other documentation since. Mob also has a history of substance use with no positive screens since 2014 or during this pregnancy.  OR * MOB's symptoms currently being treated with medication and/or therapy. Per MOB's [facility] Records, MOB sees a therapist for mental health needs.    Please contact the Clinical Social Worker if needs arise, by [facility] request, or if MOB scores greater than 9/yes to question 10 on [place] Postpartum Depression Screen.     Lori Marshall, MSW, LCSW [facility] [place] [phone]

## 2019-11-25 NOTE — Lactation Note (Signed)
This note was copied from a baby's chart. Lactation Consultation Note  Patient Name: Lori Marshall [id number] Date: 11/25/2019 Reason for consult: Initial assessment;Primapara;1st time breastfeeding;Early term 37-38.6wks;Maternal endocrine disorder Type of Endocrine Disorder?: Diabetes(hx Hypothyroidism - resolved no meds needed)  Mom in the bathroom at the start of the consult and dad updated the feedings for LC .  Baby off to a good start and both parents feels the same.  Mom asleep on dads chest.  Mom receptive to being shown how to hand express , drops from both breast.  LC encouraged prior to feeding and in between and baby can be spoon fed for extra calories.  LC provided the [facility] pamphlet with phone numbers.    Maternal Data Has patient been taught Hand Expression?: Yes Does the patient have breastfeeding experience prior to this delivery?: No  Feeding Feeding Type: (per dad baby recently fed)  LATCH Score                   Interventions Interventions: Breast feeding basics reviewed;Shells  Lactation Tools Discussed/Used Tools: Shells Shell Type: Inverted   Consult Status Consult Status: Follow-up Date: 11/25/19 Follow-up type: In-patient    Lori Marshall 11/25/2019, 12:22 PM

## 2019-11-25 NOTE — Progress Notes (Signed)
Patient was ambulating in hall and felt a "gush" of bleeding, went back to room with RN.  Bleeding was a moderate amount at that time, but was small prior to that episode and following that episode.  Fundus is firm, systolic BP elevated but other VS within normal limits.  Patient denied dizziness.  It had been a few hours since she emptied her bladder and changed her pads.  Encouraged patient to go to the BR more frequently today.  Will continue to monitor.  Delice Bison Zhoe Catania Charity fundraiser

## 2019-11-25 NOTE — Anesthesia Postprocedure Evaluation (Signed)
Anesthesia Post Note  Patient: Lori Marshall  Procedure(s) Performed: AN AD HOC LABOR EPIDURAL     Patient location during evaluation: Mother Baby Anesthesia Type: Epidural Level of consciousness: awake Pain management: satisfactory to patient Vital Signs Assessment: post-procedure vital signs reviewed and stable Respiratory status: spontaneous breathing Cardiovascular status: stable Anesthetic complications: no    Last Vitals:  Vitals:   11/25/19 0251 11/25/19 0637  BP: 116/65 114/71  Pulse: 63 60  Resp: 16 16  Temp: 36.5 C 36.6 C  SpO2: 99% 99%    Last Pain:  Vitals:   11/25/19 0637  TempSrc: Oral  PainSc:    Pain Goal:                   Cephus Shelling

## 2019-11-25 NOTE — Progress Notes (Signed)
CSW went to follow up with MOB just to check in with her as CSW was notified that MOB gets anxious and some periods.    CSW congratulated MOB on the birth of infant. CSW advised MOB of CSW's role and the reason for CSW coming to visit with her. MOB confirmed that she was diagnosed with depression and anxiety in or around 2014. MOB reported that she knows for sure that she wasn't diagnosed withint the last three years. MOB reported that she does sometimes have small panic attacks when "I cant control what is taking place". MOB expressed that while here at the hospital she has felt relatively fine but does get nervous when "it comes to taking mediations that I dont have control over". CSW asked MOB to explain/. MOB informed CSW that she doesn't enjoy taking any medications that make her "loose control, for explained the IV or epidural". MOB reported that was once taking Vistaril for her mental health but hasn't been on that recently. MOB explained CSW that she has been feeling happy and tired since she gave birth. MOB advised CSW that she isnt feeling SI or HI and that she isn't involved in any DV currently.  CSW inquired from [facility] on if she was seeing therapist as [facility] records indicate that MOB is in therapy. MOB confirmed that she and spouse are both in therapy. "I go two weeks by myself and then we go two weeks together and it goes from there". MOB reported that she enjoys going to see her therapist Trula Ore H) and reports having further access to therapist during  this Post Partum Period.   CSW took time to provide MOB with PPD and SIDS education. MOB was given PPD Checklist in order to keep track of her feelings as they relate to PPD. MOB reported that she would look over information weekly and then reach out for support as needed.   MOB reports that she has all needed items to care for infant and expressed no other needs or concerns. MOB plans for infant to be seen at [facility] and will sleep in  basinet once arrived home per MOB's report.  CSW identified no other needs at this time.      Claude Manges Colby Catanese, MSW, LCSW [facility] [place] [phone]

## 2019-11-25 NOTE — Progress Notes (Signed)
PPD # 1 doing well Had chorio during labor  BP 114/71 (BP Location: Right Arm)   Pulse 60   Temp 97.9 F (36.6 C) (Oral)   Resp 16   Ht 5\' 6"  (1.676 m)   Wt 90.5 kg   SpO2 99%   Breastfeeding Unknown   BMI 32.20 kg/m  Results for orders placed or performed during the hospital encounter of 11/23/19 (from the past 24 hour(s))  Glucose, capillary     Status: Abnormal   Collection Time: 11/24/19  9:20 AM  Result Value Ref Range   Glucose-Capillary 101 (H) 70 - 99 mg/dL  Glucose, capillary     Status: None   Collection Time: 11/24/19 12:10 PM  Result Value Ref Range   Glucose-Capillary 87 70 - 99 mg/dL  Glucose, capillary     Status: Abnormal   Collection Time: 11/24/19  2:09 PM  Result Value Ref Range   Glucose-Capillary 105 (H) 70 - 99 mg/dL  CBC     Status: Abnormal   Collection Time: 11/25/19  5:12 AM  Result Value Ref Range   WBC 17.5 (H) 4.0 - 10.5 K/uL   RBC 3.62 (L) 3.87 - 5.11 MIL/uL   Hemoglobin 10.3 (L) 12.0 - 15.0 g/dL   HCT 11/27/19 (L) 03.7 - 04.8 %   MCV 86.5 80.0 - 100.0 fL   MCH 28.5 26.0 - 34.0 pg   MCHC 32.9 30.0 - 36.0 g/dL   RDW 88.9 16.9 - 45.0 %   Platelets 159 150 - 400 K/uL   nRBC 0.0 0.0 - 0.2 %   Abdomen is soft and non tender Bleeding WNL  PPD #1  Doing well Discharge home tomorrow

## 2019-11-26 DIAGNOSIS — M21332 Wrist drop, left wrist: Secondary | ICD-10-CM | POA: Diagnosis not present

## 2019-11-26 MED ORDER — ACYCLOVIR 200 MG PO CAPS: 400.0000 mg | ORAL_CAPSULE | Freq: Three times a day (TID) | ORAL | 0 refills | Status: DC

## 2019-11-26 MED ORDER — IBUPROFEN 600 MG PO TABS: 600.0000 mg | ORAL_TABLET | Freq: Four times a day (QID) | ORAL | 0 refills | Status: DC

## 2019-11-26 MED ORDER — TRAMADOL HCL 50 MG PO TABS: 50.0000 mg | ORAL_TABLET | Freq: Four times a day (QID) | ORAL | 0 refills | Status: AC | PRN

## 2019-11-26 MED ORDER — HYDROCORT-PRAMOXINE (PERIANAL) 1-1 % EX FOAM: 1.0000 | Freq: Two times a day (BID) | CUTANEOUS | 0 refills | Status: DC

## 2019-11-26 MED ORDER — PROMETHAZINE HCL 25 MG PO TABS: 25.0000 mg | ORAL_TABLET | Freq: Four times a day (QID) | ORAL | Status: DC | PRN

## 2019-11-26 MED ORDER — HYDROCORT-PRAMOXINE (PERIANAL) 1-1 % EX FOAM: 1.0000 | Freq: Two times a day (BID) | CUTANEOUS | Status: DC

## 2019-11-26 NOTE — Discharge Summary (Signed)
Obstetric Discharge Summary Reason for Admission: induction of labor Prenatal Procedures: none Intrapartum Procedures: spontaneous vaginal delivery Postpartum Procedures: none Complications-Operative and Postpartum: none Hemoglobin  Date Value Ref Range Status  11/25/2019 10.3 (L) 12.0 - 15.0 g/dL Final   HCT  Date Value Ref Range Status  11/25/2019 31.3 (L) 36.0 - 46.0 % Final    Physical Exam:  General: alert, cooperative and appears stated age 44: appropriate Uterine Fundus: firm Incision: healing well, no significant drainage, no dehiscence, no significant erythema DVT Evaluation: No evidence of DVT seen on physical exam.  Discharge Diagnoses: Term Pregnancy-delivered  Discharge Information: Date: 11/26/2019 Activity: pelvic rest Diet: routine Medications: ibuprofen and tramadol and acyclovir and proctofoam and phenergan Condition: stable Instructions: refer to practice specific booklet Discharge to: home   Newborn Data: Live born female  Birth Weight: 6 lb 14.1 oz (3121 g) APGAR: 6, 9  Newborn Delivery   Birth date/time: 11/24/2019 18:41:00 Delivery type: Vaginal, Spontaneous      Home with mother.  Jeani Hawking 11/26/2019, 9:22 AM

## 2019-11-26 NOTE — Lactation Note (Signed)
This note was copied from a baby's chart. Lactation Consultation Note  Patient Name: Lori Marshall [id number] Date: 11/26/2019 Reason for consult: Follow-up assessment;Primapara;1st time breastfeeding;Early term 37-38.6wks;Infant weight loss;Other (Comment)(10 % weight loss- F/U weight tomorrow)  Baby is 62 hours old  Baby awake and hungry/ rooting.  LC offered to assist with latch. Mom changed a wet diaper and LC placed  The baby STS to latch on the left breast . Mom started to latch with the cradle position and LC recommended the cross cradle to work on obtaining the depth.  First few minutes LC heard rubbing noise and with breast compressions the noise went away  and the dimpling in cheek disappeared. Depth achieved and baby fed for 15 mins with multiple swallows increasing with breast compressions.  Baby became non - nutritive and LC explained to mom how to release suction.  After burping the baby the baby woke up and was rooting so LC recommended latching on the right breast football . Areola edema noted and LC showed mom how to use the reverse pressure technique to obtain depth. Also showed dad how he could help to increase depth. Baby fed for 15 mins and released nipple well rounded.   LC reviewed the recommended LC plan :  Comfort gels x 6 days of needed ( per mom denies sore nipples )  Breast shells between feedings except when sleeping  Prior to latching breast massage, hand express, prepump to stretch the nipple / areola complex for a deeper latch . Reverse pressure if needed.  Latch with firm support and depth.  Have dad help if needed.  offer 2nd breast if baby still hungry.  Breast feeding goals for 24 hours - 8-12 feedings with feeding cues and by 3 hours due to 10 % weight loss.  Post pump both breast for 10 -15 min when the baby is not cluster feeding, spoon feed baby back the milk.  Mom has comfort gels , breast shells , and a he hand pump .  Per mom has a DEBP at home  and the [facility] pamphlet with phone numbers.  Mom aware she can call back and consider coming back for [facility] O/P if needed.    Maternal Data Has patient been taught Hand Expression?: Yes  Feeding Feeding Type: Breast Fed  LATCH Score Latch: Grasps breast easily, tongue down, lips flanged, rhythmical sucking.  Audible Swallowing: Spontaneous and intermittent  Type of Nipple: Everted at rest and after stimulation  Comfort (Breast/Nipple): Filling, red/small blisters or bruises, mild/mod discomfort  Hold (Positioning): Assistance needed to correctly position infant at breast and maintain latch.  LATCH Score: 8  Interventions Interventions: Breast feeding basics reviewed;Assisted with latch;Skin to skin;Breast massage;Hand express;Reverse pressure;Breast compression;Adjust position;Support pillows;Position options;Shells;Hand pump;Comfort gels  Lactation Tools Discussed/Used Tools: Shells;Pump;Flanges;Comfort gels Flange Size: 24;27 Shell Type: Inverted Breast pump type: Manual WIC Program: No Pump Review: Milk Storage;Setup, frequency, and cleaning Initiated by:: MAI Date initiated:: 11/26/19   Consult Status Consult Status: Complete Date: 11/26/19    Kathrin Greathouse 11/26/2019, 12:04 PM

## 2019-11-28 LAB — SURGICAL PATHOLOGY

## 2019-12-23 DIAGNOSIS — L821 Other seborrheic keratosis: Secondary | ICD-10-CM | POA: Diagnosis not present

## 2019-12-23 DIAGNOSIS — D225 Melanocytic nevi of trunk: Secondary | ICD-10-CM | POA: Diagnosis not present

## 2019-12-23 DIAGNOSIS — L820 Inflamed seborrheic keratosis: Secondary | ICD-10-CM | POA: Diagnosis not present

## 2020-01-02 DIAGNOSIS — Z124 Encounter for screening for malignant neoplasm of cervix: Secondary | ICD-10-CM | POA: Diagnosis not present

## 2020-01-02 DIAGNOSIS — Z1389 Encounter for screening for other disorder: Secondary | ICD-10-CM | POA: Diagnosis not present

## 2020-03-29 DIAGNOSIS — Z20822 Contact with and (suspected) exposure to covid-19: Secondary | ICD-10-CM | POA: Diagnosis not present

## 2020-07-10 DIAGNOSIS — Z1152 Encounter for screening for COVID-19: Secondary | ICD-10-CM | POA: Diagnosis not present

## 2020-07-10 DIAGNOSIS — R0789 Other chest pain: Secondary | ICD-10-CM | POA: Diagnosis not present

## 2020-07-23 DIAGNOSIS — Z1231 Encounter for screening mammogram for malignant neoplasm of breast: Secondary | ICD-10-CM | POA: Diagnosis not present

## 2020-09-05 DIAGNOSIS — L718 Other rosacea: Secondary | ICD-10-CM | POA: Diagnosis not present

## 2020-11-05 DIAGNOSIS — Z7189 Other specified counseling: Secondary | ICD-10-CM | POA: Diagnosis not present

## 2020-11-05 DIAGNOSIS — N281 Cyst of kidney, acquired: Secondary | ICD-10-CM | POA: Diagnosis not present

## 2020-11-05 DIAGNOSIS — R109 Unspecified abdominal pain: Secondary | ICD-10-CM | POA: Diagnosis not present

## 2020-11-05 DIAGNOSIS — N200 Calculus of kidney: Secondary | ICD-10-CM | POA: Diagnosis not present

## 2020-11-05 DIAGNOSIS — R110 Nausea: Secondary | ICD-10-CM | POA: Diagnosis not present

## 2020-11-06 ENCOUNTER — Other Ambulatory Visit: Payer: Self-pay

## 2020-11-06 ENCOUNTER — Emergency Department (HOSPITAL_COMMUNITY)
Admission: EM | Admit: 2020-11-06 | Discharge: 2020-11-06 | Disposition: A | Discharge status: Home / Self Care | Payer: BC Managed Care – PPO | Attending: Emergency Medicine | Admitting: Emergency Medicine

## 2020-11-06 ENCOUNTER — Encounter (HOSPITAL_COMMUNITY): Payer: Self-pay | Admitting: Pharmacy Technician

## 2020-11-06 ENCOUNTER — Emergency Department (HOSPITAL_COMMUNITY): Payer: BC Managed Care – PPO

## 2020-11-06 DIAGNOSIS — R109 Unspecified abdominal pain: Secondary | ICD-10-CM | POA: Insufficient documentation

## 2020-11-06 DIAGNOSIS — K219 Gastro-esophageal reflux disease without esophagitis: Secondary | ICD-10-CM | POA: Diagnosis not present

## 2020-11-06 DIAGNOSIS — Z8719 Personal history of other diseases of the digestive system: Secondary | ICD-10-CM | POA: Diagnosis not present

## 2020-11-06 DIAGNOSIS — E039 Hypothyroidism, unspecified: Secondary | ICD-10-CM | POA: Insufficient documentation

## 2020-11-06 DIAGNOSIS — Z9049 Acquired absence of other specified parts of digestive tract: Secondary | ICD-10-CM | POA: Insufficient documentation

## 2020-11-06 DIAGNOSIS — Z87891 Personal history of nicotine dependence: Secondary | ICD-10-CM | POA: Diagnosis not present

## 2020-11-06 LAB — CBC WITH DIFFERENTIAL/PLATELET
Abs Immature Granulocytes: 0.04 10*3/uL (ref 0.00–0.07)
Basophils Absolute: 0 10*3/uL (ref 0.0–0.1)
Basophils Relative: 1 %
Eosinophils Absolute: 0.2 10*3/uL (ref 0.0–0.5)
Eosinophils Relative: 2 %
HCT: 39.2 % (ref 36.0–46.0)
Hemoglobin: 13.4 g/dL (ref 12.0–15.0)
Immature Granulocytes: 1 %
Lymphocytes Relative: 28 %
Lymphs Abs: 2 10*3/uL (ref 0.7–4.0)
MCH: 31.9 pg (ref 26.0–34.0)
MCHC: 34.2 g/dL (ref 30.0–36.0)
MCV: 93.3 fL (ref 80.0–100.0)
Monocytes Absolute: 0.5 10*3/uL (ref 0.1–1.0)
Monocytes Relative: 6 %
Neutro Abs: 4.5 10*3/uL (ref 1.7–7.7)
Neutrophils Relative %: 62 %
Platelets: 281 10*3/uL (ref 150–400)
RBC: 4.2 MIL/uL (ref 3.87–5.11)
RDW: 11.9 % (ref 11.5–15.5)
WBC: 7.2 10*3/uL (ref 4.0–10.5)
nRBC: 0 % (ref 0.0–0.2)

## 2020-11-06 LAB — COMPREHENSIVE METABOLIC PANEL
ALT: 25 U/L (ref 0–44)
AST: 23 U/L (ref 15–41)
Albumin: 3.8 g/dL (ref 3.5–5.0)
Alkaline Phosphatase: 57 U/L (ref 38–126)
Anion gap: 11 (ref 5–15)
BUN: 7 mg/dL (ref 6–20)
CO2: 24 mmol/L (ref 22–32)
Calcium: 9.1 mg/dL (ref 8.9–10.3)
Chloride: 103 mmol/L (ref 98–111)
Creatinine, Ser: 0.52 mg/dL (ref 0.44–1.00)
GFR, Estimated: >60 mL/min (ref >60)
Glucose, Bld: 112 mg/dL — ABNORMAL HIGH (ref 70–99)
Potassium: 3.9 mmol/L (ref 3.5–5.1)
Sodium: 138 mmol/L (ref 135–145)
Total Bilirubin: 0.8 mg/dL (ref 0.3–1.2)
Total Protein: 6.5 g/dL (ref 6.5–8.1)

## 2020-11-06 LAB — URINALYSIS, ROUTINE W REFLEX MICROSCOPIC
Bilirubin Urine: NEGATIVE
Glucose, UA: NEGATIVE mg/dL
Hgb urine dipstick: NEGATIVE
Ketones, ur: NEGATIVE mg/dL
Leukocytes,Ua: NEGATIVE
Nitrite: NEGATIVE
Protein, ur: NEGATIVE mg/dL
Specific Gravity, Urine: 1.012 (ref 1.005–1.030)
pH: 6 (ref 5.0–8.0)

## 2020-11-06 LAB — C-REACTIVE PROTEIN: CRP: 0.9 mg/dL (ref <1.0)

## 2020-11-06 LAB — I-STAT BETA HCG BLOOD, ED (MC, WL, AP ONLY): I-stat hCG, quantitative: <5 m[IU]/mL (ref <5)

## 2020-11-06 LAB — D-DIMER, QUANTITATIVE: D-Dimer, Quant: <0.27 ug/mL-FEU (ref 0.00–0.50)

## 2020-11-06 LAB — SEDIMENTATION RATE: Sed Rate: 3 mm/hr (ref 0–22)

## 2020-11-06 LAB — LIPASE, BLOOD: Lipase: 28 U/L (ref 11–51)

## 2020-11-06 MED ORDER — OXYCODONE-ACETAMINOPHEN 5-325 MG PO TABS: 1.0000 | ORAL_TABLET | Freq: Four times a day (QID) | ORAL | 0 refills | Status: DC | PRN

## 2020-11-06 MED ORDER — ORPHENADRINE CITRATE ER 100 MG PO TB12: 100.0000 mg | ORAL_TABLET | Freq: Two times a day (BID) | ORAL | 0 refills | Status: DC

## 2020-11-06 MED ORDER — PROMETHAZINE HCL 25 MG PO TABS: 25.0000 mg | ORAL_TABLET | Freq: Four times a day (QID) | ORAL | 0 refills | Status: DC | PRN

## 2020-11-06 MED ORDER — IBUPROFEN 600 MG PO TABS: 600.0000 mg | ORAL_TABLET | Freq: Four times a day (QID) | ORAL | 0 refills | Status: DC | PRN

## 2020-11-06 MED ORDER — METHOCARBAMOL 1000 MG/10ML IJ SOLN
500.0000 mg | Freq: Once | INTRAMUSCULAR | Status: AC
  Administered 2020-11-06: 500 mg via INTRAMUSCULAR
  Filled 2020-11-06: qty 5

## 2020-11-06 MED ORDER — ONDANSETRON HCL 4 MG/2ML IJ SOLN
4.0000 mg | Freq: Once | INTRAMUSCULAR | Status: AC
  Administered 2020-11-06: 4 mg via INTRAVENOUS
  Filled 2020-11-06: qty 2

## 2020-11-06 MED ORDER — KETOROLAC TROMETHAMINE 30 MG/ML IJ SOLN
30.0000 mg | Freq: Once | INTRAMUSCULAR | Status: AC
  Administered 2020-11-06: 30 mg via INTRAVENOUS
  Filled 2020-11-06: qty 1

## 2020-11-06 MED ORDER — LACTATED RINGERS IV BOLUS
1000.0000 mL | Freq: Once | INTRAVENOUS | Status: AC
  Administered 2020-11-06: 1000 mL via INTRAVENOUS

## 2020-11-06 MED ORDER — HYDROMORPHONE HCL 1 MG/ML IJ SOLN
1.0000 mg | Freq: Once | INTRAMUSCULAR | Status: AC
  Administered 2020-11-06: 1 mg via INTRAVENOUS
  Filled 2020-11-06: qty 1

## 2020-11-06 MED ORDER — PROMETHAZINE HCL 25 MG/ML IJ SOLN
25.0000 mg | Freq: Once | INTRAMUSCULAR | Status: AC
  Administered 2020-11-06: 25 mg via INTRAVENOUS
  Filled 2020-11-06: qty 1

## 2020-11-06 MED ORDER — HYDROMORPHONE HCL 1 MG/ML IJ SOLN
0.5000 mg | Freq: Once | INTRAMUSCULAR | Status: AC
Start: 2020-11-06 — End: 2020-11-06
  Administered 2020-11-06: 0.5 mg via INTRAVENOUS
  Filled 2020-11-06: qty 1

## 2020-11-06 MED ORDER — ORPHENADRINE CITRATE 30 MG/ML IJ SOLN: 60.0000 mg | Freq: Once | INTRAMUSCULAR | Status: DC

## 2020-11-06 NOTE — ED Triage Notes (Signed)
Pt here with reports R sided flank pain with emesis and headache. States had a scan done yesterday and dx with kidney stones.

## 2020-11-06 NOTE — ED Provider Notes (Signed)
[facility] EMERGENCY DEPARTMENT Provider Note   CSN: [id number] Arrival date & time: 11/06/20  0920     History Chief Complaint  Patient presents with  . Flank Pain    Lori Marshall is a 45 y.o. female.  HPI Patient where she had a severe and quite sudden onset of pain in her right mid back\flank.  She reports the pain has had both sharp and deep aching quality to it.  She has been very nauseated.  She has not been actively vomiting.  She reports she has been unable to get comfortable.  She has tried over-the-counter Tylenol but not other medication.  She reports she did go see her PCP yesterday and was given a shot for nausea medicine but no other medication for symptoms.  She reports she had a CT scan done and was told that she had a kidney stone.  She has not had any fevers or chills.  No cough or shortness of breath.  Pain radiates slightly anteriorly towards the abdomen but does not radiate into the buttock or leg.  No weakness numbness tingling pain or swelling in the legs.  Patient is postpartum 12 months.  She is not breast-feeding.  She has taken negative home pregnancy test.  She reports about a day after the pain started she started getting a really bad headache.  She reports she is got a tight and pressure sensation across her forehead and into her temples.  She reports some light sensitivity to the eyes.  She has been having persistent nausea since the flank pain started.  Patient does have history of migraines.  She is uncertain if the pain in her flank might of triggered a migraine.    Past Medical History:  Diagnosis Date  . Anxiety   . Depression   . Depressive disorder, not elsewhere classified   . Diarrhea   . Esophageal reflux   . Gestational diabetes   . Hypothyroid   . Irritable bowel syndrome   . Migraine, unspecified, without mention of intractable migraine without mention of status migrainosus   . Panic disorder without agoraphobia    . Vomiting alone     Patient Active Problem List   Diagnosis Date Noted  . Pregnancy 11/23/2019  . Visual disturbance of one eye 11/18/2019  . Headache in pregnancy, third trimester 11/18/2019  . Pregnant and not yet delivered in third trimester 11/18/2019  . Abnormal glucose tolerance test (GTT) during pregnancy, antepartum 09/24/2019  . Alcohol abuse 08/12/2013  . Depression 08/11/2013  . Clostridium difficile colitis 08/09/2013  . Anxiety   . GERD 05/17/2010  . Irritable bowel syndrome 05/17/2010    Past Surgical History:  Procedure Laterality Date  . APPENDECTOMY    . CHOLECYSTECTOMY  08/19/2011   Procedure: LAPAROSCOPIC CHOLECYSTECTOMY WITH INTRAOPERATIVE CHOLANGIOGRAM;  Surgeon: Velora Heckler, MD;  Location: [facility];  Service: General;  Laterality: N/A;  . COLON SURGERY    . WISDOM TOOTH EXTRACTION       OB History    Gravida  4   Para  1   Term  1   Preterm      AB  3   Living  1     SAB  2   IAB  1   Ectopic      Multiple  0   Live Births  1           Family History  Problem Relation Age of Onset  . Diabetes  Father   . Melanoma Father   . Colon polyps Father   . Colon cancer Neg Hx     Social History   Tobacco Use  . Smoking status: Former Smoker    Packs/day: 0.50    Years: 5.00    Pack years: 2.50    Types: Cigarettes    Quit date: 2020    Years since quitting: 2.1  . Smokeless tobacco: Never Used  Vaping Use  . Vaping Use: Never used  Substance Use Topics  . Alcohol use: Yes    Comment: socially; last drank champagne feb 2021  . Drug use: No    Home Medications Prior to Admission medications   Medication Sig Start Date End Date Taking? Authorizing Provider  ibuprofen (ADVIL) 600 MG tablet Take 1 tablet (600 mg total) by mouth every 6 (six) hours as needed. 11/06/20  Yes Arby BarrettePfeiffer, Encarnacion Bole, MD  orphenadrine (NORFLEX) 100 MG tablet Take 1 tablet (100 mg total) by mouth 2 (two) times daily. 11/06/20  Yes Arby BarrettePfeiffer, Chayse Zatarain, MD   oxyCODONE-acetaminophen (PERCOCET) 5-325 MG tablet Take 1-2 tablets by mouth every 6 (six) hours as needed. 11/06/20  Yes Arby BarrettePfeiffer, Rachit Grim, MD  promethazine (PHENERGAN) 25 MG tablet Take 1 tablet (25 mg total) by mouth every 6 (six) hours as needed for nausea or vomiting. 09/28/19  Yes Leftwich-Kirby, Wilmer FloorLisa A, CNM  promethazine (PHENERGAN) 25 MG tablet Take 1 tablet (25 mg total) by mouth every 6 (six) hours as needed for nausea or vomiting. 11/06/20  Yes Arby BarrettePfeiffer, Jarmon Javid, MD  acyclovir (ZOVIRAX) 200 MG capsule Take 2 capsules (400 mg total) by mouth 3 (three) times daily. Patient not taking: Reported on 11/06/2020 11/26/19   Marcelle OverlieGrewal, Michelle, MD  hydrocortisone-pramoxine [facility](PROCTOFOAM-HC) rectal foam Place 1 applicator rectally 2 (two) times daily. Dispense 14 applications please Patient not taking: Reported on 11/06/2020 11/26/19   Marcelle OverlieGrewal, Michelle, MD  ibuprofen (ADVIL) 600 MG tablet Take 1 tablet (600 mg total) by mouth every 6 (six) hours. Patient not taking: Reported on 11/06/2020 11/26/19   Marcelle OverlieGrewal, Michelle, MD    Allergies    Dust mite extract, Medrol [methylprednisolone], Baclofen, and Novocain [procaine]  Review of Systems   Review of Systems 10 systems reviewed and negative except as per HPI Physical Exam Updated Vital Signs BP 113/72 (BP Location: Left Arm)   Pulse 61   Temp 98.1 F (36.7 C) (Oral)   Resp 16   SpO2 95%   Physical Exam Constitutional:      Comments: Patient is alert and nontoxic.  She is uncomfortable in appearance.  Mental status is clear.  No respiratory distress.  HENT:     Head: Normocephalic and atraumatic.     Mouth/Throat:     Mouth: Mucous membranes are moist.     Pharynx: Oropharynx is clear.  Eyes:     Extraocular Movements: Extraocular movements intact.     Conjunctiva/sclera: Conjunctivae normal.     Pupils: Pupils are equal, round, and reactive to light.  Cardiovascular:     Rate and Rhythm: Normal rate and regular rhythm.  Pulmonary:     Effort:  Pulmonary effort is normal.     Breath sounds: Normal breath sounds.  Abdominal:     Comments: Abdomen is soft.  Mildly reproducible pain in the right flank area.  No guarding.  No significant CVA tenderness.  Some mildly reproducible discomfort to palpation across the right flank region.  No rash or soft tissue abnormality.  Musculoskeletal:  General: No swelling or tenderness. Normal range of motion.     Cervical back: Neck supple.     Right lower leg: No edema.     Left lower leg: No edema.  Skin:    General: Skin is warm and dry.     Findings: No rash.  Neurological:     General: No focal deficit present.     Mental Status: She is oriented to person, place, and time.     Cranial Nerves: No cranial nerve deficit.     Motor: No weakness.  Psychiatric:        Mood and Affect: Mood normal.     ED Results / Procedures / Treatments   Labs (all labs ordered are listed, but only abnormal results are displayed) Labs Reviewed  URINALYSIS, ROUTINE W REFLEX MICROSCOPIC - Abnormal; Notable for the following components:      Result Value   Color, Urine STRAW (*)    All other components within normal limits  COMPREHENSIVE METABOLIC PANEL - Abnormal; Notable for the following components:   Glucose, Bld 112 (*)    All other components within normal limits  LIPASE, BLOOD  CBC WITH DIFFERENTIAL/PLATELET  D-DIMER, QUANTITATIVE  SEDIMENTATION RATE  C-REACTIVE PROTEIN  I-STAT BETA HCG BLOOD, ED ([facility], [facility], [facility] ONLY)    EKG None  Radiology No results found.  Procedures Procedures   Medications Ordered in ED Medications  ketorolac (TORADOL) 30 MG/ML injection 30 mg (30 mg Intravenous Given 11/06/20 1030)  ondansetron (ZOFRAN) injection 4 mg (4 mg Intravenous Given 11/06/20 1030)  lactated ringers bolus 1,000 mL (0 mLs Intravenous Stopped 11/06/20 1202)  methocarbamol (ROBAXIN) injection 500 mg (500 mg Intramuscular Given 11/06/20 1121)  promethazine (PHENERGAN) injection 25 mg (25 mg  Intravenous Given 11/06/20 1200)  HYDROmorphone (DILAUDID) injection 1 mg (1 mg Intravenous Given 11/06/20 1306)  HYDROmorphone (DILAUDID) injection 0.5 mg (0.5 mg Intravenous Given 11/06/20 1426)    ED Course  I have reviewed the triage vital signs and the nursing notes.  Pertinent labs & imaging results that were available during my care of the patient were reviewed by me and considered in my medical decision making (see chart for details).    MDM Rules/Calculators/A&P                          Patient presents with pain in the right flank that is very severe.  She had CT scan done yesterday that showed a punctate stone in the right kidney but did not note any in the ureter.  This seems unlikely to be the cause of the patient's pain.  She has already had cholecystectomy.  She does not have associated fever or cough.  Soft tissue exam is normal.  D-dimer is less than 0.27, sed rate and CRP are low making PE or infectious etiology much less likely.  Symptoms may represent pleurisy, musculoskeletal pain or possibly early shingles before rash appearance.  Also possible passage of the small kidney stone identified yesterday.  Patient's renal function is normal.  Urinalysis shows no signs of infection.  Pain was controlled with combination of Toradol, Robaxin and Dilaudid.  Patient also had headache however this started about a day or so after she felt intense pain in the flank.  No red flag associated headache symptoms.  This appears most consistent with tension headache or migraine associated with pain at another source.  Flank pain and headache resolved with pain control.  At this  time will continue outpatient pain control.  Patient does have follow-up with urology tomorrow.  Plan will be for her to continue her follow-up appointment and follow-up with PCP as well.  Return precautions reviewed.  Final Clinical Impression(s) / ED Diagnoses Final diagnoses:  Right flank pain    Rx / DC Orders ED Discharge  Orders         Ordered    oxyCODONE-acetaminophen (PERCOCET) 5-325 MG tablet  Every 6 hours PRN        11/06/20 1600    ibuprofen (ADVIL) 600 MG tablet  Every 6 hours PRN        11/06/20 1600    orphenadrine (NORFLEX) 100 MG tablet  2 times daily        11/06/20 1600    promethazine (PHENERGAN) 25 MG tablet  Every 6 hours PRN        11/06/20 1600           Arby Barrette, MD 11/06/20 [phone]

## 2020-11-06 NOTE — Discharge Instructions (Addendum)
1.  At this time is unclear if your pain is due to musculoskeletal pain or possible small passing kidney stone.  Sometimes pain from shingles develops before a rash is seen. 2.  The current plan is free to follow-up with urology tomorrow as scheduled.  You may take pain medications as prescribed today. 3.  Return to the emergency department if you develop a fever, new and concerning symptoms, or other concerning changes.  Return immediately if you develop a rash in the area of your pain, a blistering rash is the sign of shingles and this is best treated early. 4.  You should also make a plan to follow-up with your primary care provider as soon as possible.

## 2020-11-06 NOTE — ED Notes (Addendum)
Pt resting quietly.

## 2020-11-07 DIAGNOSIS — R1011 Right upper quadrant pain: Secondary | ICD-10-CM | POA: Diagnosis not present

## 2020-11-08 DIAGNOSIS — Z1152 Encounter for screening for COVID-19: Secondary | ICD-10-CM | POA: Diagnosis not present

## 2020-11-24 IMAGING — CT CT HEAD W/O CM
4 series · 17 of 47 positions shown, 19 images · non-contrast
Comparison: April 28, 2013

CLINICAL DATA: Right eye loss of visual field. Headache. Dizziness.
Thirty-six weeks pregnant.

EXAM:
CT HEAD WITHOUT CONTRAST
TECHNIQUE: Contiguous axial images were obtained from the base of the skull
through the vertex without intravenous contrast.

[Series 2: head without · axial · non-contrast · 0.45mm/px · z∈[-92,+28]mm · 7 of 34 slices shown, 9 images]
[im 5/34  brain]
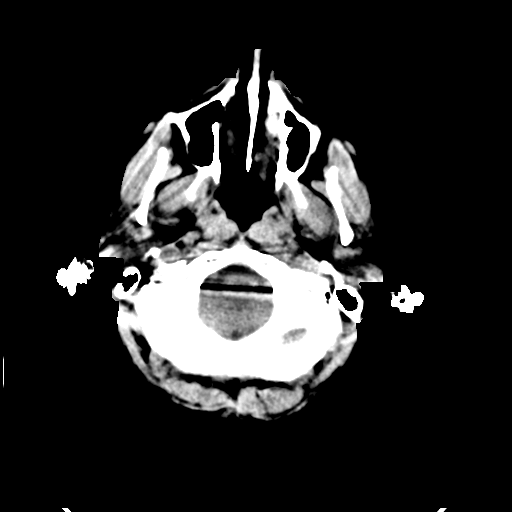
[im 5/34  bone]
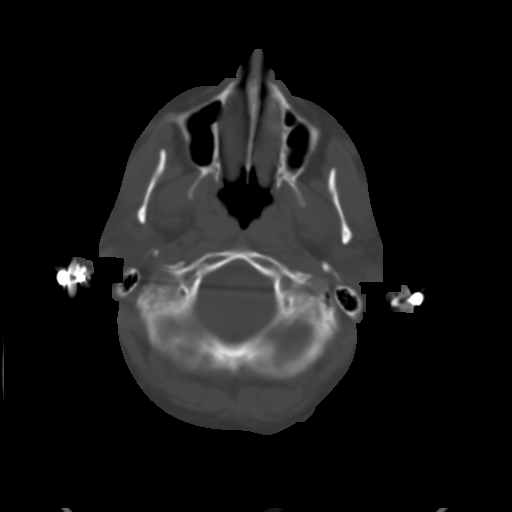
[im 9/34  brain]
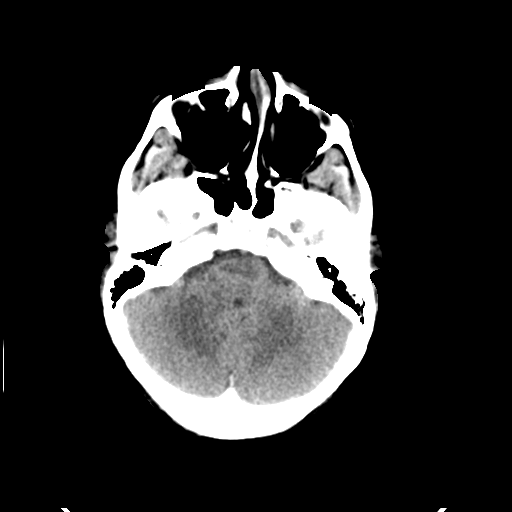
[im 13/34  brain]
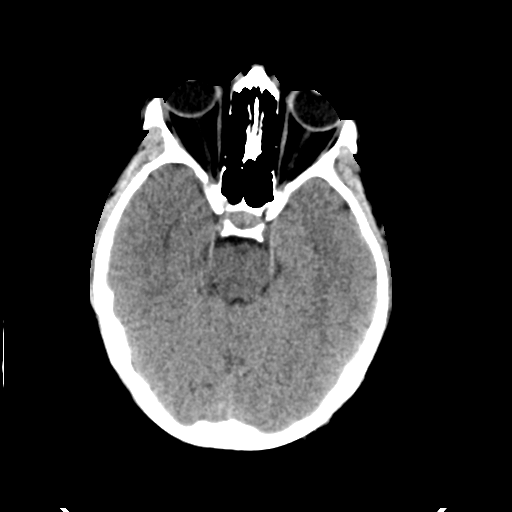
[im 17/34  brain]
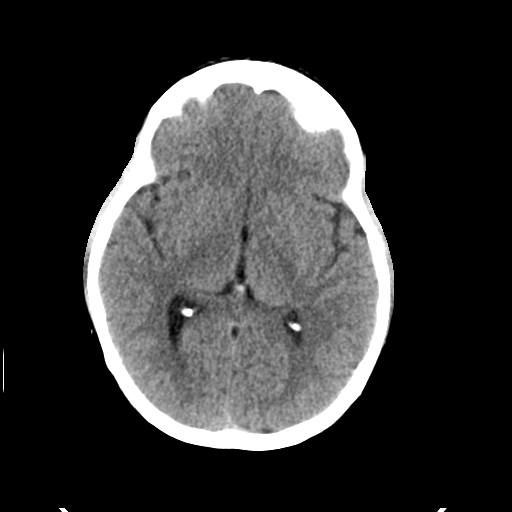
[im 21/34  brain]
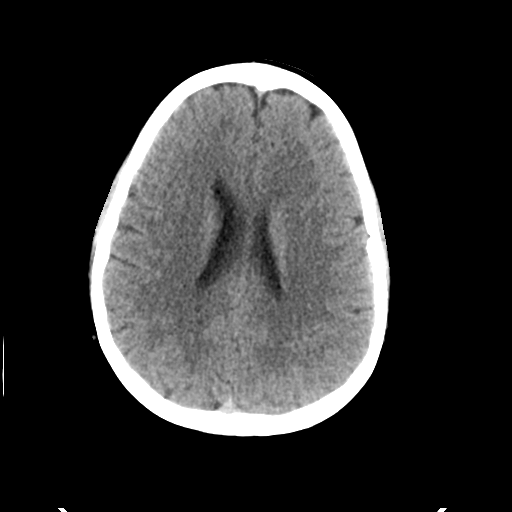
[im 21/34  bone]
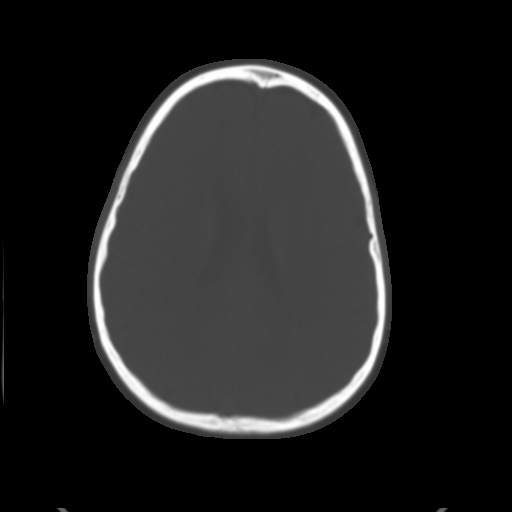
[im 25/34  brain]
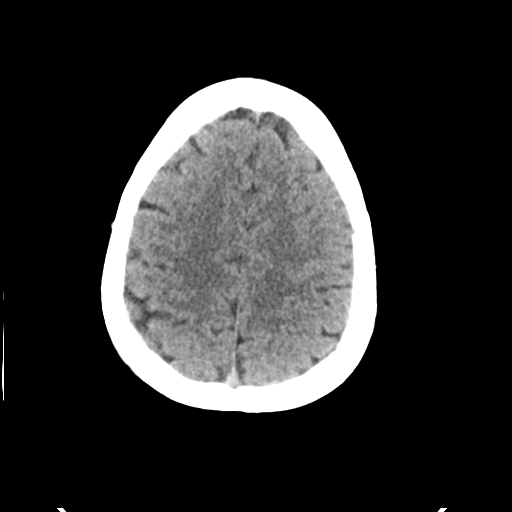
[im 29/34  brain]
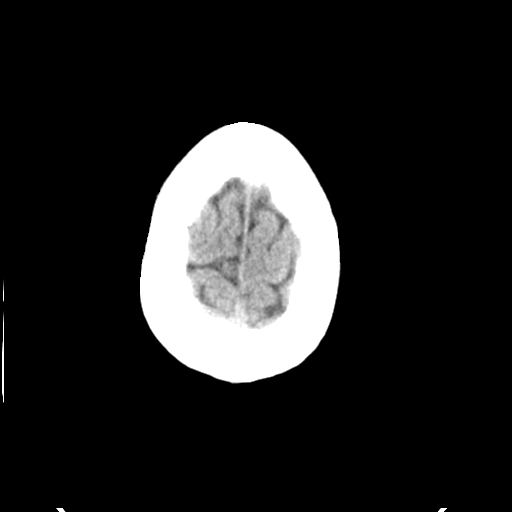

[Series 3: head bone · axial · 0.45mm/px · z∈[-96,-38]mm · 4 of 85 slices shown]
[im 9/85  bone]
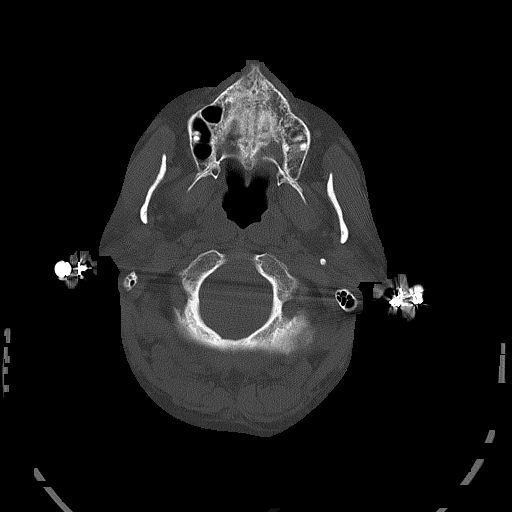
[im 17/85  bone]
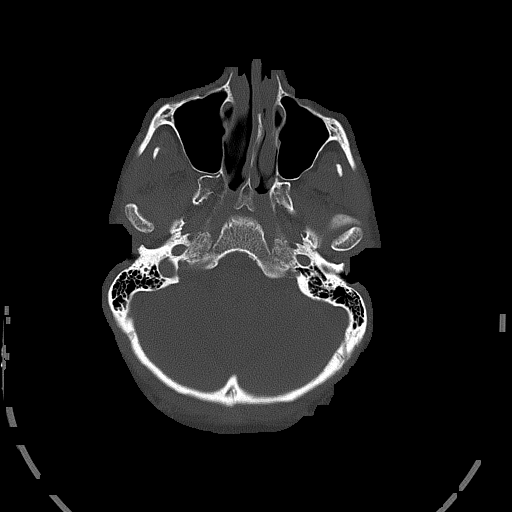
[im 26/85  bone]
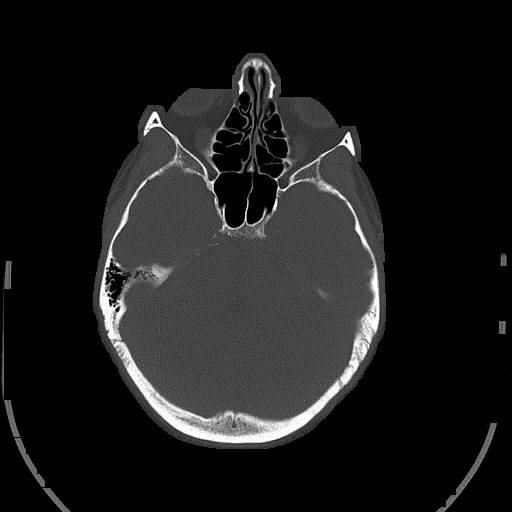
[im 38/85  bone]
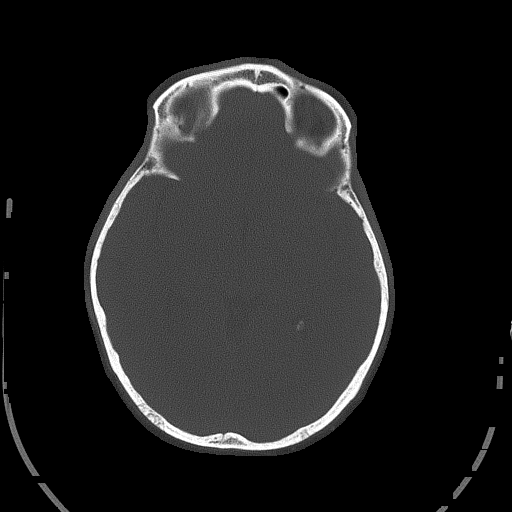

[Series 4: head without cor · coronal · non-contrast · 0.33mm/px · 3 of 67 slices shown]
[im 23/67  brain]
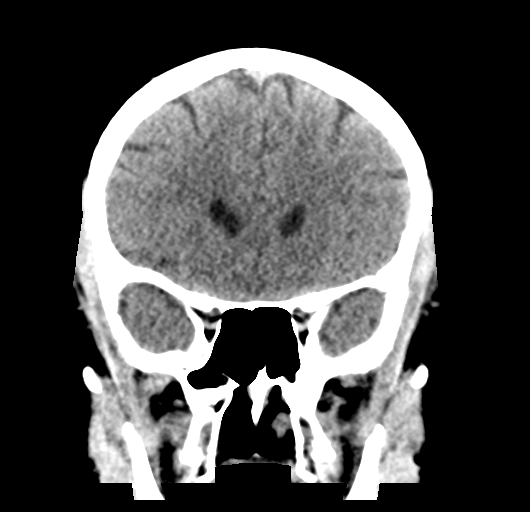
[im 30/67  brain]
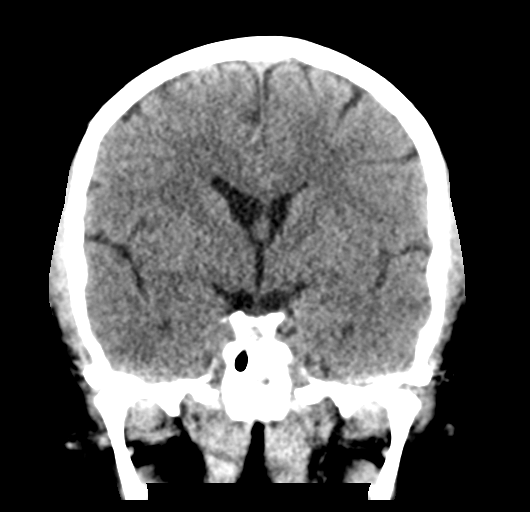
[im 37/67  brain]
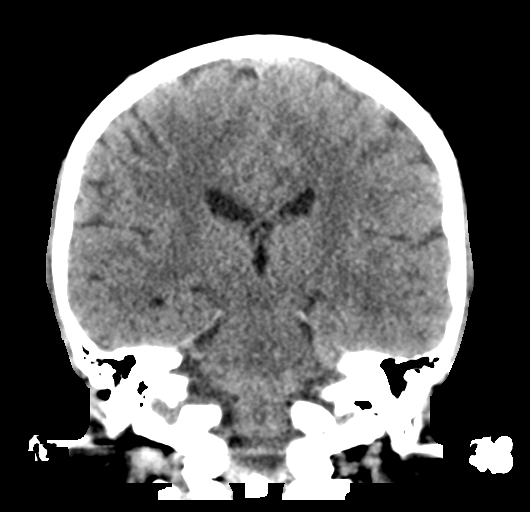

[Series 5: head without sag · sagittal · non-contrast · 0.33mm/px · 3 of 58 slices shown]
[im 20/58  brain]
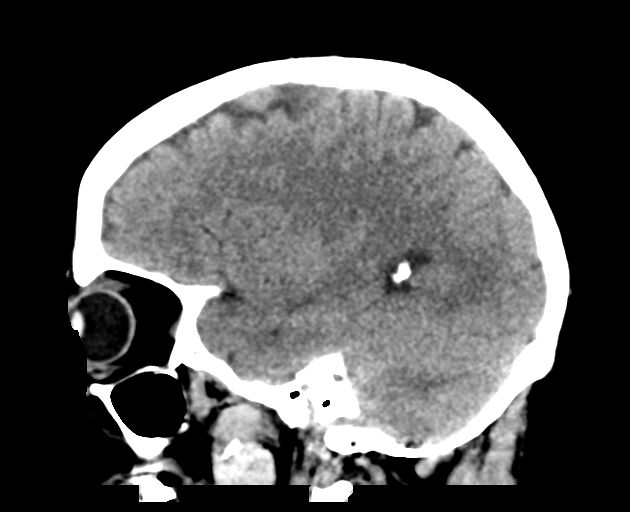
[im 29/58  brain]
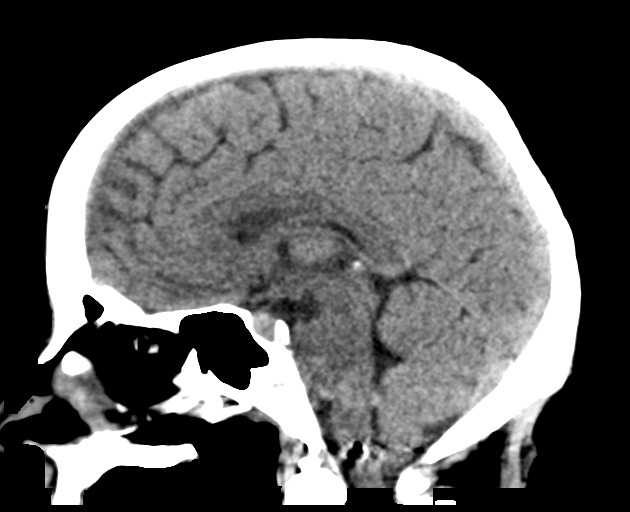
[im 39/58  brain]
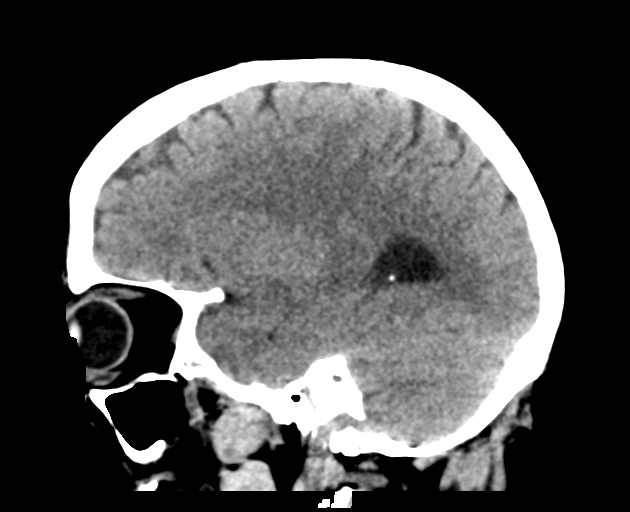

[17 of 47 positions shown; findings below may reference images not displayed]

FINDINGS: Brain: Pituitary gland measuring 12 x 10 x 8 mm, within normal
limits during pregnancy and lactation. No acute intracranial
hemorrhage, loss of gray-white matter junction differentiation,
midline shift, mass effect or abnormal extra-axial fluid collection.
Normal intracranial ventricular system.

Vascular: No hyperdense vessel or unexpected calcification.

Skull: Normal.

Sinuses/Orbits: Minimal bilateral ethmoidal mucosal thickening.
Normal bilateral orbital soft tissues.

Other: A pneumatized bilateral middle ears and mastoid air cells.
IMPRESSION: Normal unenhanced CT brain examination.

## 2020-12-24 DIAGNOSIS — L918 Other hypertrophic disorders of the skin: Secondary | ICD-10-CM | POA: Diagnosis not present

## 2020-12-24 DIAGNOSIS — D1801 Hemangioma of skin and subcutaneous tissue: Secondary | ICD-10-CM | POA: Diagnosis not present

## 2020-12-24 DIAGNOSIS — D2261 Melanocytic nevi of right upper limb, including shoulder: Secondary | ICD-10-CM | POA: Diagnosis not present

## 2020-12-24 DIAGNOSIS — L820 Inflamed seborrheic keratosis: Secondary | ICD-10-CM | POA: Diagnosis not present

## 2020-12-24 DIAGNOSIS — D225 Melanocytic nevi of trunk: Secondary | ICD-10-CM | POA: Diagnosis not present

## 2020-12-24 DIAGNOSIS — L814 Other melanin hyperpigmentation: Secondary | ICD-10-CM | POA: Diagnosis not present

## 2021-02-10 ENCOUNTER — Encounter: Payer: Self-pay | Admitting: Physician Assistant

## 2021-02-10 ENCOUNTER — Telehealth: Payer: BC Managed Care – PPO | Admitting: Physician Assistant

## 2021-02-10 DIAGNOSIS — U071 COVID-19: Secondary | ICD-10-CM

## 2021-02-10 MED ORDER — BENZONATATE 100 MG PO CAPS: 100.0000 mg | ORAL_CAPSULE | Freq: Three times a day (TID) | ORAL | 0 refills | Status: DC | PRN

## 2021-02-10 NOTE — Progress Notes (Signed)
Ms. Lori, Marshall are scheduled for a virtual visit with your provider today.    Just as we do with appointments in the office, we must obtain your consent to participate.  Your consent will be active for this visit and any virtual visit you may have with one of our providers in the next 365 days.    If you have a MyChart account, I can also send a copy of this consent to you electronically.  All virtual visits are billed to your insurance company just like a traditional visit in the office.  As this is a virtual visit, video technology does not allow for your provider to perform a traditional examination.  This may limit your provider's ability to fully assess your condition.  If your provider identifies any concerns that need to be evaluated in person or the need to arrange testing such as labs, EKG, etc, we will make arrangements to do so.    Although advances in technology are sophisticated, we cannot ensure that it will always work on either your end or our end.  If the connection with a video visit is poor, we may have to switch to a telephone visit.  With either a video or telephone visit, we are not always able to ensure that we have a secure connection.   I need to obtain your verbal consent now.   Are you willing to proceed with your visit today?   Royal Beirne has provided verbal consent on 02/10/2021 for a virtual visit (video or telephone).   Margaretann Loveless, PA-C 02/10/2021  3:48 PM    MyChart Video Visit    Virtual Visit via Video Note   This visit type was conducted due to national recommendations for restrictions regarding the COVID-19 Pandemic (e.g. social distancing) in an effort to limit this patient's exposure and mitigate transmission in our community. This patient is at least at moderate risk for complications without adequate follow up. This format is felt to be most appropriate for this patient at this time. Physical exam was limited by quality of the video  and audio technology used for the visit.   Patient location: Home Provider location: Home office in [place]  I discussed the limitations of evaluation and management by telemedicine and the availability of in person appointments. The patient expressed understanding and agreed to proceed.  Patient: Lori Marshall   DOB: 11/21/1975   45 y.o. Female  MRN: [id number] Visit Date: 02/10/2021  Today's healthcare provider: Margaretann Loveless, PA-C   No chief complaint on file.  Subjective    URI  This is a new problem. The current episode started yesterday. The problem has been rapidly worsening. The maximum temperature recorded prior to her arrival was more than 104 F. The fever has been present for Less than 1 day. Associated symptoms include congestion, coughing (dry), headaches, nausea and sinus pain. Pertinent negatives include no abdominal pain, ear pain, plugged ear sensation, rash, rhinorrhea, sore throat or vomiting. She has tried acetaminophen (Vit D, Vit C, zinc) for the symptoms. The treatment provided no relief.   Tested positive for covid 19 today, 02/10/21  Patient Active Problem List   Diagnosis Date Noted   Pregnancy 11/23/2019   Visual disturbance of one eye 11/18/2019   Headache in pregnancy, third trimester 11/18/2019   Pregnant and not yet delivered in third trimester 11/18/2019   Abnormal glucose tolerance test (GTT) during pregnancy, antepartum 09/24/2019   Alcohol abuse 08/12/2013   Depression 08/11/2013  Clostridium difficile colitis 08/09/2013   Anxiety    GERD 05/17/2010   Irritable bowel syndrome 05/17/2010   Past Medical History:  Diagnosis Date   Anxiety    Depression    Depressive disorder, not elsewhere classified    Diarrhea    Esophageal reflux    Gestational diabetes    Hypothyroid    Irritable bowel syndrome    Migraine, unspecified, without mention of intractable migraine without mention of status migrainosus    Panic disorder  without agoraphobia    Vomiting alone       Medications: Outpatient Medications Prior to Visit  Medication Sig   acyclovir (ZOVIRAX) 200 MG capsule Take 2 capsules (400 mg total) by mouth 3 (three) times daily. (Patient not taking: Reported on 11/06/2020)   hydrocortisone-pramoxine (PROCTOFOAM-HC) rectal foam Place 1 applicator rectally 2 (two) times daily. Dispense 14 applications please (Patient not taking: Reported on 11/06/2020)   ibuprofen (ADVIL) 600 MG tablet Take 1 tablet (600 mg total) by mouth every 6 (six) hours. (Patient not taking: Reported on 11/06/2020)   ibuprofen (ADVIL) 600 MG tablet Take 1 tablet (600 mg total) by mouth every 6 (six) hours as needed.   orphenadrine (NORFLEX) 100 MG tablet Take 1 tablet (100 mg total) by mouth 2 (two) times daily.   oxyCODONE-acetaminophen (PERCOCET) 5-325 MG tablet Take 1-2 tablets by mouth every 6 (six) hours as needed.   promethazine (PHENERGAN) 25 MG tablet Take 1 tablet (25 mg total) by mouth every 6 (six) hours as needed for nausea or vomiting.   promethazine (PHENERGAN) 25 MG tablet Take 1 tablet (25 mg total) by mouth every 6 (six) hours as needed for nausea or vomiting.   No facility-administered medications prior to visit.    Review of Systems  Constitutional:  Positive for chills, fatigue and fever. Negative for appetite change.  HENT:  Positive for congestion, postnasal drip and sinus pain. Negative for ear pain, rhinorrhea and sore throat.   Respiratory:  Positive for cough (dry).   Cardiovascular: Negative.   Gastrointestinal:  Positive for nausea. Negative for abdominal pain and vomiting.  Musculoskeletal:  Positive for back pain.  Skin:  Negative for rash.  Neurological:  Positive for dizziness and headaches.   Last CBC Lab Results  Component Value Date   WBC 7.2 11/06/2020   HGB 13.4 11/06/2020   HCT 39.2 11/06/2020   MCV 93.3 11/06/2020   MCH 31.9 11/06/2020   RDW 11.9 11/06/2020   PLT 281 11/06/2020   Last  metabolic panel Lab Results  Component Value Date   GLUCOSE 112 (H) 11/06/2020   NA 138 11/06/2020   K 3.9 11/06/2020   CL 103 11/06/2020   CO2 24 11/06/2020   BUN 7 11/06/2020   CREATININE 0.52 11/06/2020   GFRNONAA >60 11/06/2020   GFRAA >60 11/23/2019   CALCIUM 9.1 11/06/2020   PHOS 2.9 08/07/2013   PROT 6.5 11/06/2020   ALBUMIN 3.8 11/06/2020   BILITOT 0.8 11/06/2020   ALKPHOS 57 11/06/2020   AST 23 11/06/2020   ALT 25 11/06/2020   ANIONGAP 11 11/06/2020      Objective    There were no vitals taken for this visit. BP Readings from Last 3 Encounters:  11/06/20 113/72  11/26/19 138/81  11/19/19 107/64   Wt Readings from Last 3 Encounters:  11/23/19 199 lb 8 oz (90.5 kg)  11/18/19 195 lb 15.8 oz (88.9 kg)  11/10/19 192 lb 1.6 oz (87.1 kg)      Physical  Exam Vitals reviewed.  Constitutional:      General: She is not in acute distress.    Appearance: Normal appearance. She is well-developed. She is ill-appearing and diaphoretic (at one point in video patient became flushed and started sweating; fever had come down to 100 from 104; then patient developed chills again and fever went back up to 103).  HENT:     Head: Normocephalic and atraumatic.  Pulmonary:     Effort: Pulmonary effort is normal. No respiratory distress (able to speak in full, coherent sentences).  Musculoskeletal:     Cervical back: Normal range of motion and neck supple.  Neurological:     Mental Status: She is alert.  Psychiatric:        Attention and Perception: Attention and perception normal.        Mood and Affect: Mood is anxious.        Behavior: Behavior normal.        Thought Content: Thought content normal.        Judgment: Judgment normal.       Assessment & Plan     1. COVID-19 - Continue OTC symptomatic management of choice - Will send OTC vitamins and supplement information through [facility] - Tessalon perles provided for patient - Patient enrolled in MyChart symptom  monitoring - Push fluids - Rest as needed - Discussed return precautions and when to seek in-person evaluation, sent via [facility] as well  - MyChart COVID-19 home monitoring program; Future - Temperature monitoring; Future - benzonatate (TESSALON) 100 MG capsule; Take 1 capsule (100 mg total) by mouth 3 (three) times daily as needed.  Dispense: 30 capsule; Refill: 0   No follow-ups on file.     I discussed the assessment and treatment plan with the patient. The patient was provided an opportunity to ask questions and all were answered. The patient agreed with the plan and demonstrated an understanding of the instructions.   The patient was advised to call back or seek an in-person evaluation if the symptoms worsen or if the condition fails to improve as anticipated.  I provided 30 minutes of face-to-face time during this encounter via MyChart Video enabled encounter.   Reine Just [facility] [phone] (phone) [phone] (fax)  [facility]

## 2021-02-10 NOTE — Patient Instructions (Signed)
Hello Lori Marshall,  You are being placed in the home monitoring program for COVID-19 (commonly known as Coronavirus).  This is because you are suspected to have the virus or are known to have the virus.  If you are unsure which group you fall into call your clinic.    As part of this program, you'll answer a daily questionnaire in the MyChart mobile app. You'll receive a notification through the MyChart app when the questionnaire is available. When you log in to MyChart, you'll see the tasks in your To Do activity.       Clinicians will see any answers that are concerning and take appropriate steps.  If at any point you are having a medical emergency, call 911.  If otherwise concerned call your clinic instead of coming into the clinic or hospital.  To keep from spreading the disease you should: Stay home and limit contact with other people as much as possible.  Wash your hands frequently. Cover your coughs and sneezes with a tissue, and throw used tissues in the trash.   Clean and disinfect frequently touched surfaces and objects.    Take care of yourself by: Staying home Resting Drinking fluids Take fever-reducing medications (Tylenol/Acetaminophen and Ibuprofen)  For more information on the disease go to the Centers for Disease Control and Prevention website     Can take to lessen severity: Vit C 500mg  twice daily Quercertin 250-500mg  twice daily Zinc 75-100mg  daily Melatonin 3-6 mg at bedtime Vit D3 1000-2000 IU daily Aspirin 81 mg daily with food Optional: Famotidine 20mg  daily Also can add tylenol/ibuprofen as needed for fevers and body aches May add Mucinex or Mucinex DM as needed for cough/congestion   10 Things You Can Do to Manage Your COVID-19 Symptoms at Home If you have possible or confirmed COVID-19 Stay home except to get medical care. Monitor your symptoms carefully. If your symptoms get worse, call your healthcare provider immediately. Get rest and stay  hydrated. If you have a medical appointment, call the healthcare provider ahead of time and tell them that you have or may have COVID-19. For medical emergencies, call 911 and notify the dispatch personnel that you have or may have COVID-19. Cover your cough and sneezes with a tissue or use the inside of your elbow. Wash your hands often with soap and water for at least 20 seconds or clean your hands with an alcohol-based hand sanitizer that contains at least 60% alcohol. As much as possible, stay in a specific room and away from other people in your home. Also, you should use a separate bathroom, if available. If you need to be around other people in or outside of the home, wear a mask. Avoid sharing personal items with other people in your household, like dishes, towels, and bedding. Clean all surfaces that are touched often, like counters, tabletops, and doorknobs. Use household cleaning sprays or wipes according to the label instructions. 03/16/2020 This information is not intended to replace advice given to you by your health care provider. Make sure you discuss any questions you have with your healthcare provider. Document Revised: 10/05/2020 Document Reviewed: 10/05/2020 Elsevier Patient Education  2022 Elsevier Inc.   COVID-19: What to Do if You Are Sick CDC has updated isolation and quarantine recommendations for the public, and is revising the CDC website to reflect these changes. These recommendations do not apply to healthcare personnel and do not supersede state, local, tribal, or territorial laws, rules, andregulations. If you have a fever,  cough or other symptoms, you might have COVID-19. Most people have mild illness and are able to recover at home. If you are sick: Keep track of your symptoms. If you have an emergency warning sign (including trouble breathing), call 911. Steps to help prevent the spread of COVID-19 if you are sick If you are sick with  COVID-19 or think you might have COVID-19, follow the steps below to care for yourself and to help protect other peoplein your home and community. Stay home except to get medical care Stay home. Most people with COVID-19 have mild illness and can recover at home without medical care. Do not leave your home, except to get medical care. Do not visit public areas. Take care of yourself. Get rest and stay hydrated. Take over-the-counter medicines, such as acetaminophen, to help you feel better. Stay in touch with your doctor. Call before you get medical care. Be sure to get care if you have trouble breathing, or have any other emergency warning signs, or if you think it is an emergency. Avoid public transportation, ride-sharing, or taxis. Separate yourself from other people As much as possible, stay in a specific room and away from other people and pets in your home. If possible, you should use a separate bathroom. If you need to be around other people or animals in oroutside of the home, wear a mask. Tell your close contactsthat they may have been exposed to COVID-19. An infected person can spread COVID-19 starting 48 hours (or 2 days) before the person has any symptoms or tests positive. By letting your close contacts know they may have been exposed to COVID-19, you are helping to protect everyone. Additional guidance is available for those living in close quarters and shared housing. See COVID-19 and Animals if you have questions about pets. If you are diagnosed with COVID-19, someone from the health department may call you. Answer the call to slow the spread. Monitor your symptoms Symptoms of COVID-19 include fever, cough, or other symptoms. Follow care instructions from your healthcare provider and local health department. Your local health authorities may give instructions on checking your symptoms and reporting information. When to seek emergency medical attention Look for emergency warning signs*  for COVID-19. If someone is showing any of these signs, seek emergency medical care immediately: Trouble breathing Persistent pain or pressure in the chest New confusion Inability to wake or stay awake Pale, gray, or blue-colored skin, lips, or nail beds, depending on skin tone *This list is not all possible symptoms. Please call your medical provider forany other symptoms that are severe or concerning to you. Call 911 or call ahead to your local emergency facility: Notify the operator that you are seeking care for someone who has or may haveCOVID-19. Call ahead before visiting your doctor Call ahead. Many medical visits for routine care are being postponed or done by phone or telemedicine. If you have a medical appointment that cannot be postponed, call your doctor's office, and tell them you have or may have COVID-19. This will help the office protect themselves and other patients. Get tested If you have symptoms of COVID-19, get tested. While waiting for test results, you stay away from others, including staying apart from those living in your household. Self-tests are one of several options for testing for the virus that causes COVID-19 and may be more convenient than laboratory-based tests and point-of-care tests. Ask your healthcare provider or your local health department if you need help interpreting your test results. You  can visit your state, tribal, local, and territorial health department's website to look for the latest local information on testing sites. If you are sick, wear a mask over your nose and mouth You should wear a mask over your nose and mouth if you must be around other people or animals, including pets (even at home). You don't need to wear the mask if you are alone. If you can't put on a mask (because of trouble breathing, for example), cover your coughs and sneezes in some other way. Try to stay at least 6 feet away from other people. This will help protect the people  around you. Masks should not be placed on young children under age 43 years, anyone who has trouble breathing, or anyone who is not able to remove the mask without help. Note: During the COVID-19 pandemic, medical grade facemasks are reserved forhealthcare workers and some first responders. Cover your coughs and sneezes Cover your mouth and nose with a tissue when you cough or sneeze. Throw away used tissues in a lined trash can. Immediately wash your hands with soap and water for at least 20 seconds. If soap and water are not available, clean your hands with an alcohol-based hand sanitizer that contains at least 60% alcohol. Clean your hands often Wash your hands often with soap and water for at least 20 seconds. This is especially important after blowing your nose, coughing, or sneezing; going to the bathroom; and before eating or preparing food. Use hand sanitizer if soap and water are not available. Use an alcohol-based hand sanitizer with at least 60% alcohol, covering all surfaces of your hands and rubbing them together until they feel dry. Soap and water are the best option, especially if hands are visibly dirty. Avoid touching your eyes, nose, and mouth with unwashed hands. Handwashing Tips Avoid sharing personal household items Do not share dishes, drinking glasses, cups, eating utensils, towels, or bedding with other people in your home. Wash these items thoroughly after using them with soap and water or put in the dishwasher. Clean all "high-touch" surfaces every day Clean and disinfect high-touch surfaces in your "sick room" and bathroom; wear disposable gloves. Let someone else clean and disinfect surfaces in common areas, but you should clean your bedroom and bathroom, if possible. If a caregiver or other person needs to clean and disinfect a sick person's bedroom or bathroom, they should do so on an as-needed basis. The caregiver/other person should wear a mask and disposable gloves  prior to cleaning. They should wait as long as possible after the person who is sick has used the bathroom before coming in to clean and use the bathroom. High-touch surfaces include phones, remote controls, counters, tabletops, doorknobs, bathroom fixtures, toilets, keyboards, tablets, and bedside tables. Clean and disinfect areas that may have blood, stool, or body fluids on them. Use household cleaners and disinfectants. Clean the area or item with soap and water or another detergent if it is dirty. Then, use a household disinfectant. Be sure to follow the instructions on the label to ensure safe and effective use of the product. Many products recommend keeping the surface wet for several minutes to ensure germs are killed. Many also recommend precautions such as wearing gloves and making sure you have good ventilation during use of the product. Use a product from [employer] List N: Disinfectants for Coronavirus (COVID-19). Complete Disinfection Guidance When you can be around others after being sick with COVID-19 Deciding when you can be around others is  different for different situations. Find out when you can safely end home isolation. For any additional questions about your care,contact your healthcare provider or state or local health department. 08/08/2020 Content source: [facility] for Immunization and Respiratory Diseases ([facility]), Division of Viral Diseases This information is not intended to replace advice given to you by your health care provider. Make sure you discuss any questions you have with your healthcare provider. Document Revised: 10/05/2020 Document Reviewed: 10/05/2020 Elsevier Patient Education  2022 [employer].

## 2021-02-11 MED ORDER — PROMETHAZINE HCL 25 MG PO TABS: 25.0000 mg | ORAL_TABLET | Freq: Three times a day (TID) | ORAL | 0 refills | Status: DC | PRN

## 2021-02-13 ENCOUNTER — Encounter (INDEPENDENT_AMBULATORY_CARE_PROVIDER_SITE_OTHER): Payer: Self-pay

## 2021-02-13 ENCOUNTER — Telehealth: Payer: Self-pay | Admitting: *Deleted

## 2021-02-13 NOTE — Telephone Encounter (Signed)
BPA triggered for new onset diarrhea, worsening weakness, temp of 102.3.  TEmp checked during call, 101.5.  Reports ears "Clogged" and dizziness after showering. States very fatigued, "But I have a one year old."  States diarrhea "Many times since last night." States is staying, drinking sugar free Gatorade, urine light, not dark, voiding WNL. States able to be up and about, just tires easily. Advised rest, hydration, monitor temp. MAy take Imodium for diarrhea.  States PCP has ordered her tessalon, "Haven't really had a cough." Advised ED for any SOB at rest, with speaking, any worsening symptoms. Pt verbalizes understanding. Advised to keep PCP informed, continue questionnaire. Pt verbalizes understanding.

## 2021-02-22 ENCOUNTER — Telehealth: Payer: Self-pay

## 2021-02-22 NOTE — Telephone Encounter (Signed)
Attempted to contact patient; vm left for patient to callback. Will send a mychart.

## 2021-02-28 DIAGNOSIS — H6982 Other specified disorders of Eustachian tube, left ear: Secondary | ICD-10-CM | POA: Diagnosis not present

## 2021-03-25 DIAGNOSIS — L718 Other rosacea: Secondary | ICD-10-CM | POA: Diagnosis not present

## 2021-03-25 DIAGNOSIS — B353 Tinea pedis: Secondary | ICD-10-CM | POA: Diagnosis not present

## 2021-03-25 DIAGNOSIS — D225 Melanocytic nevi of trunk: Secondary | ICD-10-CM | POA: Diagnosis not present

## 2021-05-09 DIAGNOSIS — R030 Elevated blood-pressure reading, without diagnosis of hypertension: Secondary | ICD-10-CM | POA: Diagnosis not present

## 2021-05-12 ENCOUNTER — Telehealth: Payer: BC Managed Care – PPO | Admitting: Family

## 2021-05-12 DIAGNOSIS — B373 Candidiasis of vulva and vagina: Secondary | ICD-10-CM

## 2021-05-12 DIAGNOSIS — B3731 Acute candidiasis of vulva and vagina: Secondary | ICD-10-CM

## 2021-05-12 MED ORDER — FLUCONAZOLE 150 MG PO TABS: 150.0000 mg | ORAL_TABLET | ORAL | 0 refills | Status: DC | PRN

## 2021-05-12 NOTE — Progress Notes (Signed)

## 2021-06-13 ENCOUNTER — Telehealth: Payer: BC Managed Care – PPO | Admitting: Physician Assistant

## 2021-06-13 DIAGNOSIS — B001 Herpesviral vesicular dermatitis: Secondary | ICD-10-CM

## 2021-06-13 MED ORDER — ACYCLOVIR 800 MG PO TABS: 800.0000 mg | ORAL_TABLET | Freq: Two times a day (BID) | ORAL | 0 refills | Status: AC

## 2021-06-13 NOTE — Progress Notes (Signed)
I have spent 5 minutes in review of e-visit questionnaire, review and updating patient chart, medical decision making and response to patient.   Dash Cardarelli Cody Kalilah Barua, PA-C    

## 2021-06-13 NOTE — Progress Notes (Signed)
We are sorry that you are not feeling well.  Here is how we plan to help! ? ?Based on what you have shared with me it does look like you have a viral infection.   ? ?Most cold sores or fever blisters are small fluid filled blisters around the mouth caused by herpes simplex virus.  The most common strain of the virus causing cold sores is herpes simplex virus 1.  It can be spread by skin contact, sharing eating utensils, or even sharing towels.  Cold sores are contagious to other people until dry. (Approximately 5-7 days).  Wash your hands. You can spread the virus to your eyes through handling your contact lenses after touching the lesions. ? ?Most people experience pain at the sight or tingling sensations in their lips that may begin before the ulcers erupt. ? ?Herpes simplex is treatable but not curable.  It may lie dormant for a long time and then reappear due to stress or prolonged sun exposure.  Many patients have success in treating their cold sores with an over the counter topical called Abreva.  You may apply the cream up to 5 times daily (maximum 10 days) until healing occurs. ? ?If you would like to use an oral antiviral medication to speed the healing of your cold sore, I have sent a prescription to your local pharmacy Acyclovir 800 mg take one by mouth twice a day for 7 days   ? ?HOME CARE: ? ?Wash your hands frequently. ?Do not pick at or rub the sore. ?Don't open the blisters. ?Avoid kissing other people during this time. ?Avoid sharing drinking glasses, eating utensils, or razors. ?Do not handle contact lenses unless you have thoroughly washed your hands with soap and warm water! ?Avoid oral sex during this time.  Herpes from sores on your mouth can spread to your partner's genital area. ?Avoid contact with anyone who has eczema or a weakened immune system. ?Cold sores are often triggered by exposure to intense sunlight, use a lip balm containing a sunscreen (SPF 30 or higher). ? ?GET HELP RIGHT AWAY  IF: ? ?Blisters look infected. ?Blisters occur near or in the eye. ?Symptoms last longer than 10 days. ?Your symptoms become worse. ? ?MAKE SURE YOU: ? ?Understand these instructions. ?Will watch your condition. ?Will get help right away if you are not doing well or get worse. ? ?  ?Your e-visit answers were reviewed by a board certified advanced clinical practitioner to complete your personal care plan.  Depending upon the condition, your plan could have  Included both over the counter or prescription medications.   ? ?Please review your pharmacy choice.  Be sure that the pharmacy you have chosen is open so that you can pick up your prescription now.  If there is a problem you can message your provider in MyChart to have the prescription routed to another pharmacy.   ? ?Your safety is important to us.  If you have drug allergies check our prescription carefully. ? ?For the next 24 hours you can use MyChart to ask questions about today's visit, request a non-urgent call back, or ask for a work or school excuse from your e-visit provider. ? ?You will get an email in the next two days asking about your experience.  I hope that your e-visit has been valuable and will speed your recovery. ? ?

## 2021-07-29 DIAGNOSIS — Z01419 Encounter for gynecological examination (general) (routine) without abnormal findings: Secondary | ICD-10-CM | POA: Diagnosis not present

## 2021-07-29 DIAGNOSIS — Z6829 Body mass index (BMI) 29.0-29.9, adult: Secondary | ICD-10-CM | POA: Diagnosis not present

## 2021-07-29 DIAGNOSIS — Z1231 Encounter for screening mammogram for malignant neoplasm of breast: Secondary | ICD-10-CM | POA: Diagnosis not present

## 2021-10-07 DIAGNOSIS — R7301 Impaired fasting glucose: Secondary | ICD-10-CM | POA: Diagnosis not present

## 2021-10-07 DIAGNOSIS — E785 Hyperlipidemia, unspecified: Secondary | ICD-10-CM | POA: Diagnosis not present

## 2021-10-11 DIAGNOSIS — R82998 Other abnormal findings in urine: Secondary | ICD-10-CM | POA: Diagnosis not present

## 2021-10-11 DIAGNOSIS — Z0000 Encounter for general adult medical examination without abnormal findings: Secondary | ICD-10-CM | POA: Diagnosis not present

## 2021-10-11 DIAGNOSIS — Z1331 Encounter for screening for depression: Secondary | ICD-10-CM | POA: Diagnosis not present

## 2021-10-11 DIAGNOSIS — E785 Hyperlipidemia, unspecified: Secondary | ICD-10-CM | POA: Diagnosis not present

## 2021-11-20 ENCOUNTER — Encounter: Payer: Self-pay | Admitting: Gastroenterology

## 2021-12-03 ENCOUNTER — Telehealth: Payer: Self-pay | Admitting: *Deleted

## 2021-12-03 NOTE — Telephone Encounter (Signed)
Patient no showed PV today at 430 pm  Called patient  at 425 pm and Left message, called py at 431 pm, LM to return call and called pt at 440 pm and left message to return call by 5 pm today- If no call by 5 pm, PV and procedure will be canceled -  ? ?no call at 5 pm - ? PV and Procedure both canceled- No Show letter mailed to patient   ?

## 2021-12-04 ENCOUNTER — Telehealth: Payer: BC Managed Care – PPO | Admitting: Nurse Practitioner

## 2021-12-04 DIAGNOSIS — L2084 Intrinsic (allergic) eczema: Secondary | ICD-10-CM

## 2021-12-04 MED ORDER — TRIAMCINOLONE ACETONIDE 0.1 % EX CREA: 1.0000 "application " | TOPICAL_CREAM | Freq: Two times a day (BID) | CUTANEOUS | 0 refills | Status: AC

## 2021-12-04 NOTE — Progress Notes (Signed)

## 2021-12-05 ENCOUNTER — Ambulatory Visit (AMBULATORY_SURGERY_CENTER): Payer: BC Managed Care – PPO | Admitting: *Deleted

## 2021-12-05 VITALS — Ht 66.0 in | Wt 185.0 lb

## 2021-12-05 DIAGNOSIS — Z1211 Encounter for screening for malignant neoplasm of colon: Secondary | ICD-10-CM

## 2021-12-05 NOTE — Progress Notes (Signed)
No egg or soy allergy known to patient  ?No issues known to pt with past sedation with any surgeries or procedures ?Patient denies ever being told they had issues or difficulty with intubation  ?No FH of Malignant Hyperthermia ?Pt is not on diet pills ?Pt is not on  home 02  ?Pt is not on blood thinners  ?Pt denies issues with constipation  ?No A fib or A flutter ? ? NO PA's for preps discussed with pt In PV today  ?Discussed with pt there will be an out-of-pocket cost for prep and that varies from $0 to 70 +  dollars - pt verbalized understanding  ? ?Due to the COVID-19 pandemic we are asking patients to follow certain guidelines in PV and the [facility]   ?Pt aware of COVID protocols and [facility] guidelines  ? ?PV completed over the phone. Pt verified name, DOB, address and insurance during PV today.  ? ?Pt encouraged to call with questions or issues.  ?If pt has My chart, procedure instructions sent via My Chart  ? ?

## 2021-12-09 DIAGNOSIS — L300 Nummular dermatitis: Secondary | ICD-10-CM | POA: Diagnosis not present

## 2021-12-13 ENCOUNTER — Encounter: Payer: Self-pay | Admitting: Gastroenterology

## 2021-12-23 ENCOUNTER — Telehealth: Payer: Self-pay | Admitting: Gastroenterology

## 2021-12-23 NOTE — Telephone Encounter (Signed)
Called patient back, she and her entire house are sick with fevers and vomiting. I advised her to reschedule and she would like to call back when she and the entire house feel better.  ?

## 2021-12-23 NOTE — Telephone Encounter (Signed)
Patient called and is sick vomiting with a fever.  She has a colonoscopy scheduled for tomorrow and wanted to know what the protocol was.  Please call patient and advise. ?

## 2021-12-24 ENCOUNTER — Encounter: Payer: BC Managed Care – PPO | Admitting: Gastroenterology

## 2022-01-13 ENCOUNTER — Encounter: Payer: Self-pay | Admitting: Gastroenterology

## 2022-01-29 DIAGNOSIS — I10 Essential (primary) hypertension: Secondary | ICD-10-CM | POA: Diagnosis not present

## 2022-01-29 DIAGNOSIS — G44209 Tension-type headache, unspecified, not intractable: Secondary | ICD-10-CM | POA: Diagnosis not present

## 2022-02-26 DIAGNOSIS — I10 Essential (primary) hypertension: Secondary | ICD-10-CM | POA: Diagnosis not present

## 2022-02-28 DIAGNOSIS — I10 Essential (primary) hypertension: Secondary | ICD-10-CM | POA: Diagnosis not present

## 2022-02-28 DIAGNOSIS — R7301 Impaired fasting glucose: Secondary | ICD-10-CM | POA: Diagnosis not present

## 2022-03-28 DIAGNOSIS — R3989 Other symptoms and signs involving the genitourinary system: Secondary | ICD-10-CM | POA: Diagnosis not present

## 2022-04-17 DIAGNOSIS — E785 Hyperlipidemia, unspecified: Secondary | ICD-10-CM | POA: Diagnosis not present

## 2022-04-17 DIAGNOSIS — F419 Anxiety disorder, unspecified: Secondary | ICD-10-CM | POA: Diagnosis not present

## 2022-04-25 DIAGNOSIS — Z1331 Encounter for screening for depression: Secondary | ICD-10-CM | POA: Diagnosis not present

## 2022-04-25 DIAGNOSIS — E785 Hyperlipidemia, unspecified: Secondary | ICD-10-CM | POA: Diagnosis not present

## 2022-04-25 DIAGNOSIS — Z1339 Encounter for screening examination for other mental health and behavioral disorders: Secondary | ICD-10-CM | POA: Diagnosis not present

## 2022-05-20 ENCOUNTER — Ambulatory Visit (INDEPENDENT_AMBULATORY_CARE_PROVIDER_SITE_OTHER): Payer: BC Managed Care – PPO

## 2022-05-20 ENCOUNTER — Ambulatory Visit (INDEPENDENT_AMBULATORY_CARE_PROVIDER_SITE_OTHER): Payer: BC Managed Care – PPO | Admitting: Podiatry

## 2022-05-20 DIAGNOSIS — M7989 Other specified soft tissue disorders: Secondary | ICD-10-CM

## 2022-05-20 DIAGNOSIS — M79672 Pain in left foot: Secondary | ICD-10-CM

## 2022-05-20 NOTE — Progress Notes (Signed)
Chief Complaint  Patient presents with   Foot Pain    Pain started a couple years ago, pain varies in intensity, pain is located behind the heel of left foot. 4th toe is numb    HPI: 46 y.o. female presenting today as a new patient for evaluation of a symptomatic mass to the posterior aspect of the left heel.  This has been present for about 2 years now.  She states that she initially noticed it when she had her last child who is now 53 years old.  It is very tender and painful and sensitive to palpation.  She states that it has bothered her now on a daily basis and she is starting to be unable to wear shoes with a heel due to the pain.  Denies a history of injury.  Past Medical History:  Diagnosis Date   Anxiety    panic attacks   C. difficile diarrhea 2014   Depression    past hx anxiety, not depression   Diarrhea    Esophageal reflux    Gestational diabetes    Hypothyroid    in her 20's x 1 yr synthroid only   Irritable bowel syndrome    Migraine, unspecified, without mention of intractable migraine without mention of status migrainosus    Panic disorder without agoraphobia    Vomiting alone     Past Surgical History:  Procedure Laterality Date   APPENDECTOMY     CHOLECYSTECTOMY  08/19/2011   Procedure: LAPAROSCOPIC CHOLECYSTECTOMY WITH INTRAOPERATIVE CHOLANGIOGRAM;  Surgeon: Earnstine Regal, MD;  Location: [facility];  Service: General;  Laterality: N/A;   COLONOSCOPY  2011   DB   WISDOM TOOTH EXTRACTION      Allergies  Allergen Reactions   Dust Mite Extract Swelling   Medrol [Methylprednisolone] Nausea And Vomiting   Sertraline Hcl Other (See Comments)   Baclofen Anxiety and Palpitations   Novocain [Procaine] Palpitations     Physical Exam: General: The patient is alert and oriented x3 in no acute distress.  Dermatology: Skin is warm, dry and supple bilateral lower extremities. Negative for open lesions or macerations.  Vascular: Palpable pedal pulses bilaterally.  Capillary refill within normal limits.  Negative for any significant edema or erythema  Neurological: Light touch and protective threshold grossly intact  Musculoskeletal Exam: No pedal deformities noted.  There is a nonadherent small symptomatic soft tissue mass to the lateral aspect of the dorsum of the posterior tubercle of the calcaneus adjacent to the insertion of the Achilles tendon.  Very sensitive and elicits pain with palpation of the mass.  It is malleable and not adhered.  Radiographic Exam:  Normal osseous mineralization. Joint spaces preserved. No fracture/dislocation/boney destruction.  Posterior and plantar heel spurs noted which are noncontributory to the patient's symptoms  Assessment: 1.  Soft tissue mass posterior lateral tubercle of the dorsum of the calcaneus adjacent to the insertion of the Achilles tendon   Plan of Care:  1. Patient evaluated. X-Rays reviewed.  2.  MRI ordered LT heel w/wo contrast to determine the size and extent and possibly pathology of the soft tissue mass. 3.  Return to clinic after MRI to review the results and discuss further treatment options which may include cortisone injection or excision depending on final needs of the MRI  *Stay-at-home mom      Edrick Kins, DPM [facility]  Dr. Edrick Kins, DPM    [address]  [place], [place] [address]                Office [phone]  Fax [phone]

## 2022-05-26 ENCOUNTER — Telehealth: Payer: Self-pay | Admitting: Podiatry

## 2022-05-26 NOTE — Telephone Encounter (Signed)
Patient left message on vm to schedule appt for MRI     856am left message on vm for patient to callback to schedule appt

## 2022-05-29 ENCOUNTER — Other Ambulatory Visit: Payer: Self-pay | Admitting: Podiatry

## 2022-05-29 DIAGNOSIS — M7989 Other specified soft tissue disorders: Secondary | ICD-10-CM

## 2022-06-05 ENCOUNTER — Encounter: Payer: Self-pay | Admitting: Neurology

## 2022-06-05 ENCOUNTER — Ambulatory Visit (INDEPENDENT_AMBULATORY_CARE_PROVIDER_SITE_OTHER): Payer: BC Managed Care – PPO | Admitting: Neurology

## 2022-06-05 VITALS — BP 125/85 | HR 84 | Ht 66.0 in | Wt 172.0 lb

## 2022-06-05 DIAGNOSIS — G43009 Migraine without aura, not intractable, without status migrainosus: Secondary | ICD-10-CM

## 2022-06-05 DIAGNOSIS — G43709 Chronic migraine without aura, not intractable, without status migrainosus: Secondary | ICD-10-CM | POA: Insufficient documentation

## 2022-06-05 MED ORDER — CYCLOBENZAPRINE HCL 10 MG PO TABS: ORAL_TABLET | ORAL | 6 refills | Status: DC

## 2022-06-05 MED ORDER — UBRELVY 100 MG PO TABS: 100.0000 mg | ORAL_TABLET | ORAL | 0 refills | Status: DC | PRN

## 2022-06-05 MED ORDER — RIZATRIPTAN BENZOATE 10 MG PO TBDP: 10.0000 mg | ORAL_TABLET | ORAL | 11 refills | Status: AC | PRN

## 2022-06-05 MED ORDER — PROMETHAZINE HCL 25 MG PO TABS: 25.0000 mg | ORAL_TABLET | Freq: Three times a day (TID) | ORAL | 11 refills | Status: DC | PRN

## 2022-06-05 NOTE — Patient Instructions (Addendum)
Newer medications: Dimas Millin, Shawn Stall, [place], [place] new medication that is spectacula  Try as needed: [name] and/or Rizatriptan(can take together or separate). Please take one tablet at the onset of your headache. If it does not improve the symptoms please take one additional tablet. Do not take more then 2 tablets in 24hrs. Can also take with Phenergan.   Will Vania Rea to a specialty Pharmacy [facility]. They will call you or text you  If [name] does not work, Try Ajovy or Emgality(Once a month injections) and after that you can likely botox approved.   Ubrogepant Tablets What is this medication? UBROGEPANT (ue BROE je pant) treats migraines. It works by blocking a substance in the body that causes migraines. It is not used to prevent migraines. This medicine may be used for other purposes; ask your health care provider or pharmacist if you have questions. COMMON BRAND NAME(S): [name] What should I tell my care team before I take this medication? They need to know if you have any of these conditions: Kidney disease Liver disease An unusual or allergic reaction to ubrogepant, other medications, foods, dyes, or preservatives Pregnant or trying to get pregnant Breast-feeding How should I use this medication? Take this medication by mouth with a glass of water. Take it as directed on the prescription label. You can take it with or without food. If it upsets your stomach, take it with food. Keep taking it unless your care team tells you to stop. Talk to your care team about the use of this medication in children. Special care may be needed. Overdosage: If you think you have taken too much of this medicine contact a poison control center or emergency room at once. NOTE: This medicine is only for you. Do not share this medicine with others. What if I miss a dose? This does not apply. This medication is not for regular use. What may interact with this medication? Do not  take this medication with any of the following: Adagrasib Ceritinib Certain antibiotics, such as chloramphenicol, clarithromycin, telithromycin Certain antivirals for HIV, such as atazanavir, cobicistat, darunavir, delavirdine, fosamprenavir, indinavir, ritonavir Certain medications for fungal infections, such as itraconazole, ketoconazole, posaconazole, voriconazole Conivaptan Grapefruit Idelalisib Mifepristone Nefazodone Ribociclib This medication may also interact with the following: Carvedilol Certain medications for seizures, such as phenobarbital, phenytoin Ciprofloxacin Cyclosporine Eltrombopag Fluconazole Fluvoxamine Quinidine Rifampin St. John's wort Verapamil This list may not describe all possible interactions. Give your health care provider a list of all the medicines, herbs, non-prescription drugs, or dietary supplements you use. Also tell them if you smoke, drink alcohol, or use illegal drugs. Some items may interact with your medicine. What should I watch for while using this medication? Visit your care team for regular checks on your progress. Tell your care team if your symptoms do not start to get better or if they get worse. Your mouth may get dry. Chewing sugarless gum or sucking hard candy and drinking plenty of water may help. Contact your care team if the problem does not go away or is severe. What side effects may I notice from receiving this medication? Side effects that you should report to your care team as soon as possible: Allergic reactions--skin rash, itching, hives, swelling of the face, lips, tongue, or throat Side effects that usually do not require medical attention (report to your care team if they continue or are bothersome): Drowsiness Dry mouth Fatigue Nausea This list may not describe all possible side effects. Call your doctor  for medical advice about side effects. You may report side effects to FDA at 1-800-FDA-1088. Where should I keep  my medication? Keep out of the reach of children and pets. Store between 15 and 30 degrees C (59 and 86 degrees F). Get rid of any unused medication after the expiration date. To get rid of medications that are no longer needed or have expired: Take the medication to a medication take-back program. Check with your pharmacy or law enforcement to find a location. If you cannot return the medication, check the label or package insert to see if the medication should be thrown out in the garbage or flushed down the toilet. If you are not sure, ask your care team. If it is safe to put it in the trash, pour the medication out of the container. Mix the medication with cat litter, dirt, coffee grounds, or other unwanted substance. Seal the mixture in a bag or container. Put it in the trash. NOTE: This sheet is a summary. It may not cover all possible information. If you have questions about this medicine, talk to your doctor, pharmacist, or health care provider.  2023 Elsevier/Gold Standard (2021-08-30 00:00:00)  Rizatriptan Disintegrating Tablets What is this medication? RIZATRIPTAN (rye za TRIP tan) treats migraines. It works by blocking pain signals and narrowing blood vessels in the brain. It belongs to a group of medications called triptans. It is not used to prevent migraines. This medicine may be used for other purposes; ask your health care provider or pharmacist if you have questions. COMMON BRAND NAME(S): Maxalt-MLT What should I tell my care team before I take this medication? They need to know if you have any of these conditions: Circulation problems in fingers and toes Diabetes Heart disease High blood pressure High cholesterol History of irregular heartbeat History of stroke Stomach or intestine problems Tobacco use An unusual or allergic reaction to rizatriptan, other medications, foods, dyes, or preservatives Pregnant or trying to get pregnant Breast-feeding How should I use this  medication? Take this medication by mouth. Take it as directed on the prescription label. You do not need water to take this medication. Leave the tablet in the sealed pack until you are ready to take it. With dry hands, open the pack and gently remove the tablet. If the tablet breaks or crumbles, throw it away. Use a new tablet. Place the tablet on the tongue and allow it to dissolve. Then, swallow it. Do not cut, crush, or chew this medication. Do not use it more often than directed. Talk to your care team about the use of this medication in children. While it may be prescribed for children as young as 6 years for selected conditions, precautions do apply. Overdosage: If you think you have taken too much of this medicine contact a poison control center or emergency room at once. NOTE: This medicine is only for you. Do not share this medicine with others. What if I miss a dose? This does not apply. This medication is not for regular use. What may interact with this medication? Do not take this medication with any of the following: Ergot alkaloids, such as dihydroergotamine, ergotamine MAOIs, such as Marplan, Nardil, Parnate Other medications for migraine headache, such as almotriptan, eletriptan, frovatriptan, naratriptan, sumatriptan, zolmitriptan This medication may also interact with the following: Certain medications for depression, anxiety, or other mental health conditions Propranolol This list may not describe all possible interactions. Give your health care provider a list of all the medicines, herbs, non-prescription  drugs, or dietary supplements you use. Also tell them if you smoke, drink alcohol, or use illegal drugs. Some items may interact with your medicine. What should I watch for while using this medication? Visit your care team for regular checks on your progress. Tell your care team if your symptoms do not start to get better or if they get worse. This medication may affect your  coordination, reaction time, or judgment. Do not drive or operate machinery until you know how this medication affects you. Sit up or stand slowly to reduce the risk of dizzy or fainting spells. If you take migraine medications for 10 or more days a month, your migraines may get worse. Keep a diary of headache days and medication use. Contact your care team if your migraine attacks occur more frequently. What side effects may I notice from receiving this medication? Side effects that you should report to your care team as soon as possible: Allergic reactions--skin rash, itching, hives, swelling of the face, lips, tongue, or throat Burning, pain, tingling, or color changes in the hands, arms, legs, or feet Heart attack--pain or tightness in the chest, shoulders, arms, or jaw, nausea, shortness of breath, cold or clammy skin, feeling faint or lightheaded Heart rhythm changes--fast or irregular heartbeat, dizziness, feeling faint or lightheaded, chest pain, trouble breathing Increase in blood pressure Irritability, confusion, fast or irregular heartbeat, muscle stiffness, twitching muscles, sweating, high fever, seizure, chills, vomiting, diarrhea, which may be signs of serotonin syndrome Raynaud syndrome--cool, numb, or painful fingers or toes that may change color from pale, to blue, to red Seizures Stroke--sudden numbness or weakness of the face, arm, or leg, trouble speaking, confusion, trouble walking, loss of balance or coordination, dizziness, severe headache, change in vision Sudden or severe stomach pain, bloody diarrhea, fever, nausea, vomiting Vision loss Side effects that usually do not require medical attention (report to your care team if they continue or are bothersome): Dizziness Unusual weakness or fatigue This list may not describe all possible side effects. Call your doctor for medical advice about side effects. You may report side effects to FDA at 1-800-FDA-1088. Where should I  keep my medication? Keep out of the reach of children and pets. Store at room temperature between 15 and 30 degrees C (59 and 86 degrees F). Protect from light and moisture. Get rid of any unused medication after the expiration date. To get rid of medications that are no longer needed or have expired: Take the medication to a medication take-back program. Check with your pharmacy or law enforcement to find a location. If you cannot return the medication, check the label or package insert to see if the medication should be thrown out in the garbage or flushed down the toilet. If you are not sure, ask your care team. If it is safe to put it in the trash, empty the medication out of the container. Mix the medication with cat litter, dirt, coffee grounds, or other unwanted substance. Seal the mixture in a bag or container. Put it in the trash. NOTE: This sheet is a summary. It may not cover all possible information. If you have questions about this medicine, talk to your doctor, pharmacist, or health care provider.  2023 Elsevier/Gold Standard (2021-12-19 00:00:00)

## 2022-06-05 NOTE — Progress Notes (Signed)
[facility]    Provider:  Dr Jaynee Eagles Requesting Provider: Crist Infante, MD Primary Care Provider:  Crist Infante, MD  CC:  migraines  HPI:  Lori Marshall is a 46 y.o. female here as requested by Crist Infante, MD for migraines. PMHx HLD, HTN, overweight, anxiety, impaired glucose, insomnia, hpercalcemia, manic episode, mood disorder, constipation, abnormal LFTs, chronic pain, headache. Started in her 97s. Pulsating/pounding/throbbing, can be unilateral , photo/phonophobia, nausea, started getting worse in her first trimester of prgnancy but her 2nd-3rd trimester was fine but since then about 3+ years her migraines have worsened and they can be severe and can vomit. She has a toddler and noise can really trigger, laying down helps but she has vomiting, severe. She had botox in the past which helped. She has some floaters but doesn't appear to be an aura. Can start in the forehead and behind the eyes. Flexeril helps. Radiate to the back of the head. Has shoulder tightness. She can't sleep during a migraine, she works herself up. They have a teenager and a toddler, they are not having anymore children but she can;t use bith control. Over the last year, usually start in the afternoon and can last overnight so > 12 hours, worse with period. 6 migraine days a month and <14 total headache days a month.   Reviewed notes, labs and imaging from outside physicians, which showed:  From a thorough review of records, medications tried that can be used in migraine management include: flexeril,phenergan,melatonin,benadryl, tylenol, asa, atenolol, baclofen,fioricet,stadol,carbatrol,celexa,decadron, ibuprofen,ketorolac,labetalol, mag,robaxin,mobic,meclizine,reglan,naproxen, zofran,compazine,zoloft,nortriptyline,sumatriptan(gave her palpitations and anxiety),rizatriptan,valproic acid, aimovig contraindicated due to HTN and constipation, topamax  CT head 11/18/2019: CLINICAL DATA:  Right eye  loss of visual field. Headache. Dizziness. Thirty-six weeks pregnant.   EXAM: CT HEAD WITHOUT CONTRAST   TECHNIQUE: Contiguous axial images were obtained from the base of the skull through the vertex without intravenous contrast.   COMPARISON:  April 28, 2013   FINDINGS: Brain: Pituitary gland measuring 12 x 10 x 8 mm, within normal limits during pregnancy and lactation. No acute intracranial hemorrhage, loss of gray-white matter junction differentiation, midline shift, mass effect or abnormal extra-axial fluid collection. Normal intracranial ventricular system.   Vascular: No hyperdense vessel or unexpected calcification.   Skull: Normal.   Sinuses/Orbits: Minimal bilateral ethmoidal mucosal thickening. Normal bilateral orbital soft tissues.   Other: A pneumatized bilateral middle ears and mastoid air cells.   IMPRESSION: Normal unenhanced CT brain examination.  Review of Systems: Patient complains of symptoms per HPI as well as the following symptoms anxiety and panic attacks. Pertinent negatives and positives per HPI. All others negative.   Social History   Socioeconomic History   Marital status: Married    Spouse name: Adean Milosevic   Number of children: 0   Years of education: Not on file   Highest education level: Not on file  Occupational History   Occupation: Animator: [employer]  Tobacco Use   Smoking status: Former    Packs/day: 0.50    Years: 5.00    Total pack years: 2.50    Types: Cigarettes    Quit date: 2020    Years since quitting: 3.7   Smokeless tobacco: Never  Vaping Use   Vaping Use: Never used  Substance and Sexual Activity   Alcohol use: Yes    Comment: socially; last drank champagne feb 2021   Drug use: No   Sexual activity: Not Currently    Birth control/protection: None  Other  Topics Concern   Not on file  Social History Narrative   ** Merged History Encounter **       Patient contact person: Lenon Oms  [phone]   Social Determinants of Health   Financial Resource Strain: Not on file  Food Insecurity: Not on file  Transportation Needs: Not on file  Physical Activity: Not on file  Stress: Not on file  Social Connections: Not on file  Intimate Partner Violence: Not on file    Family History  Problem Relation Age of Onset   Migraines Mother    Diabetes Father    Melanoma Father    Colon polyps Father    Colon cancer Neg Hx    Esophageal cancer Neg Hx    Rectal cancer Neg Hx    Stomach cancer Neg Hx     Past Medical History:  Diagnosis Date   Anxiety    panic attacks   C. difficile diarrhea 2014   Depression    past hx anxiety, not depression   Diarrhea    Esophageal reflux    Gestational diabetes    Hypothyroid    in her 20's x 1 yr synthroid only   Irritable bowel syndrome    Migraine, unspecified, without mention of intractable migraine without mention of status migrainosus    Panic disorder without agoraphobia    Vomiting alone     Patient Active Problem List   Diagnosis Date Noted   Migraine without aura and without status migrainosus, not intractable 06/05/2022   Pregnancy 11/23/2019   Visual disturbance of one eye 11/18/2019   Headache in pregnancy, third trimester 11/18/2019   Pregnant and not yet delivered in third trimester 11/18/2019   Abnormal glucose tolerance test (GTT) during pregnancy, antepartum 09/24/2019   Alcohol abuse 08/12/2013   Depression 08/11/2013   Clostridium difficile colitis 08/09/2013   Anxiety    GERD 05/17/2010   Irritable bowel syndrome 05/17/2010    Past Surgical History:  Procedure Laterality Date   APPENDECTOMY     CHOLECYSTECTOMY  08/19/2011   Procedure: LAPAROSCOPIC CHOLECYSTECTOMY WITH INTRAOPERATIVE CHOLANGIOGRAM;  Surgeon: Velora Heckler, MD;  Location: [facility];  Service: General;  Laterality: N/A;   COLONOSCOPY  2011   DB   WISDOM TOOTH EXTRACTION      Current Outpatient Medications  Medication Sig  Dispense Refill   acyclovir (ZOVIRAX) 800 MG tablet      ALPRAZolam (XANAX) 0.5 MG tablet Take 0.5 mg by mouth daily as needed.     Chlorphen-Pseudoeph-Methscop (ALLERGY DN PO)      diphenhydrAMINE HCl (BENADRYL ALLERGY PO)      famotidine (PEPCID) 20 MG tablet 1 tablet at bedtime as needed     hydrochlorothiazide (HYDRODIURIL) 12.5 MG tablet Take 12.5 mg by mouth every morning.     ibuprofen (ADVIL) 600 MG tablet Take 1 tablet (600 mg total) by mouth every 6 (six) hours as needed. 30 tablet 0   MELATONIN PO      nystatin (MYCOSTATIN/NYSTOP) powder nystatin 100,000 unit/gram topical powder     Omega-3 Fatty Acids (FISH OIL PO) Take by mouth 2 (two) times daily. 4 tablets 2 morning,2 night     Oxymetazoline HCl (RHOFADE) 1 % CREA Rhofade 1 % topical cream  EVENLY APPLY A PEA SIZE AMOUNT TO THE FACE EVERY MORNING. WASH HANDS AFTER USE     REPATHA SURECLICK 140 MG/ML SOAJ SMARTSIG:140 Milligram(s) SUB-Q Every 2 Weeks     rizatriptan (MAXALT-MLT) 10 MG disintegrating tablet  Take 1 tablet (10 mg total) by mouth as needed for migraine. May repeat in 2 hours if needed 9 tablet 11   Semaglutide, 1 MG/DOSE, (OZEMPIC, 1 MG/DOSE,) 2 MG/1.5ML SOPN      triamcinolone cream (KENALOG) 0.1 % Apply 1 application. topically 2 (two) times daily. 453.6 g 0   Ubrogepant (UBRELVY) 100 MG TABS Take 100 mg by mouth every 2 (two) hours as needed. Maximum 200mg  a day. 2 tablet 0   Ubrogepant (UBRELVY) 100 MG TABS Take 100 mg by mouth every 2 (two) hours as needed. Maximum 200mg  a day. 16 tablet 11   UNABLE TO FIND Essential oils     valACYclovir (VALTREX) 1000 MG tablet take 2 tablets at the first sign of fever blister. 12 hours later take 2 more , then stop     cyclobenzaprine (FLEXERIL) 10 MG tablet 1 tablet as needed up to 3x a day for muscle spasms or muscle tension 60 tablet 6   promethazine (PHENERGAN) 25 MG tablet Take 1 tablet (25 mg total) by mouth every 8 (eight) hours as needed for nausea or vomiting. 30  tablet 11   No current facility-administered medications for this visit.    Allergies as of 06/05/2022 - Review Complete 06/05/2022  Allergen Reaction Noted   Dust mite extract Swelling 12/30/2010   Medrol [methylprednisolone] Nausea And Vomiting 08/06/2013   Sertraline hcl Other (See Comments) 10/11/2021   Baclofen Anxiety and Palpitations 09/22/2018   Novocain [procaine] Palpitations 08/06/2013    Vitals: BP 125/85   Pulse 84   Ht 5\' 6"  (1.676 m)   Wt 172 lb (78 kg)   BMI 27.76 kg/m  Last Weight:  Wt Readings from Last 1 Encounters:  06/05/22 172 lb (78 kg)   Last Height:   Ht Readings from Last 1 Encounters:  06/05/22 5\' 6"  (1.676 m)     Physical exam: Exam: Gen: NAD, conversant, well nourised, well groomed                     CV: RRR, no MRG. No Carotid Bruits. No peripheral edema, warm, nontender Eyes: Conjunctivae clear without exudates or hemorrhage  Neuro: Detailed Neurologic Exam  Speech:    Speech is normal; fluent and spontaneous with normal comprehension.  Cognition:    The patient is oriented to person, place, and time;     recent and remote memory intact;     language fluent;     normal attention, concentration,     fund of knowledge Cranial Nerves:    The pupils are equal, round, and reactive to light. The fundi are normal and spontaneous venous pulsations are present. Visual fields are full to finger confrontation. Extraocular movements are intact. Trigeminal sensation is intact and the muscles of mastication are normal. The face is symmetric. The palate elevates in the midline. Hearing intact. Voice is normal. Shoulder shrug is normal. The tongue has normal motion without fasciculations.   Coordination:    Normal .   Gait:     normal.   Motor Observation:    No asymmetry, no atrophy, and no involuntary movements noted. Tone:    Normal muscle tone.    Posture:    Posture is normal. normal erect    Strength:    Strength is V/V in the  upper and lower limbs.      Sensation: intact to LT     Reflex Exam:  DTR's:    Deep tendon reflexes in the upper and  lower extremities are normal bilaterally.   Toes:    The toes are downgoing bilaterally.   Clonus:    Clonus is absent.    Assessment/Plan:  Patient with migraines, had a long discussion about options and migraine management  Newer medications: Dimas Millin, Shawn Stall, [place], [place] new medication that is spectaculal  Try as needed: [name] and/or Rizatriptan(can take together or separate). Please take one tablet at the onset of your headache. If it does not improve the symptoms please take one additional tablet. Do not take more then 2 tablets in 24hrs. Can also take with Phenergan.   Flexeril for neck spasms  Will Vania Rea to a specialty Pharmacy [facility]. They will call you or text you  If [name] does not work, Try Ajovy or Emgality(Once a month injections) and after that you can likely botox approved if needed.  Meds ordered this encounter  Medications   rizatriptan (MAXALT-MLT) 10 MG disintegrating tablet    Sig: Take 1 tablet (10 mg total) by mouth as needed for migraine. May repeat in 2 hours if needed    Dispense:  9 tablet    Refill:  11   cyclobenzaprine (FLEXERIL) 10 MG tablet    Sig: 1 tablet as needed up to 3x a day for muscle spasms or muscle tension    Dispense:  60 tablet    Refill:  6   Ubrogepant (UBRELVY) 100 MG TABS    Sig: Take 100 mg by mouth every 2 (two) hours as needed. Maximum 200mg  a day.    Dispense:  2 tablet    Refill:  0   promethazine (PHENERGAN) 25 MG tablet    Sig: Take 1 tablet (25 mg total) by mouth every 8 (eight) hours as needed for nausea or vomiting.    Dispense:  30 tablet    Refill:  11   Ubrogepant (UBRELVY) 100 MG TABS    Sig: Take 100 mg by mouth every 2 (two) hours as needed. Maximum 200mg  a day.    Dispense:  16 tablet    Refill:  11    Episodic migraines. Failed sumatriptan,  rizatriptan.  6 migraine days a month and <14 total headache days a month.    Cc: , MD,  , MD  Rodrigo Ran, MD  [facility] [address] [place], [address] [place]  Phone [phone] Fax [phone]  I spent over 60 minutes of face-to-face and non-face-to-face time with patient on the  1. Migraine without aura and without status migrainosus, not intractable    diagnosis.  This included previsit chart review, lab review, study review, order entry, electronic health record documentation, patient education on the different diagnostic and therapeutic options, counseling and coordination of care, risks and benefits of management, compliance, or risk factor reduction

## 2022-06-06 ENCOUNTER — Ambulatory Visit
Admission: RE | Admit: 2022-06-06 | Discharge: 2022-06-06 | Disposition: A | Discharge status: Home / Self Care | Payer: BC Managed Care – PPO | Source: Ambulatory Visit | Attending: Podiatry | Admitting: Podiatry

## 2022-06-06 DIAGNOSIS — M25572 Pain in left ankle and joints of left foot: Secondary | ICD-10-CM | POA: Diagnosis not present

## 2022-06-06 DIAGNOSIS — M7989 Other specified soft tissue disorders: Secondary | ICD-10-CM

## 2022-06-06 MED ORDER — GADOBENATE DIMEGLUMINE 529 MG/ML IV SOLN
15.0000 mL | Freq: Once | INTRAVENOUS | Status: AC | PRN
  Administered 2022-06-06: 15 mL via INTRAVENOUS

## 2022-06-08 ENCOUNTER — Encounter: Payer: Self-pay | Admitting: Neurology

## 2022-06-08 MED ORDER — UBRELVY 100 MG PO TABS: 100.0000 mg | ORAL_TABLET | ORAL | 11 refills | Status: DC | PRN

## 2022-06-11 ENCOUNTER — Ambulatory Visit: Payer: BC Managed Care – PPO | Admitting: Podiatry

## 2022-06-16 ENCOUNTER — Ambulatory Visit (INDEPENDENT_AMBULATORY_CARE_PROVIDER_SITE_OTHER): Payer: BC Managed Care – PPO | Admitting: Podiatry

## 2022-06-16 DIAGNOSIS — M7662 Achilles tendinitis, left leg: Secondary | ICD-10-CM | POA: Diagnosis not present

## 2022-06-16 MED ORDER — BETAMETHASONE SOD PHOS & ACET 6 (3-3) MG/ML IJ SUSP
3.0000 mg | Freq: Once | INTRAMUSCULAR | Status: AC
  Administered 2022-06-16: 3 mg via INTRA_ARTICULAR

## 2022-06-16 NOTE — Progress Notes (Signed)
Chief Complaint  Patient presents with   MRI results    Patient is here to go over mri results with the provider.    HPI: 46 y.o. female presenting today for follow-up evaluation of a symptomatic mass to the posterior aspect of the left heel.  This has been present for about 2 years now.  She states that she initially noticed it when she had her last child who is now 9 years old.  It is very tender and painful and sensitive to palpation.  She states that it has bothered her now on a daily basis and she is starting to be unable to wear shoes with a heel due to the pain.  Denies a history of injury.  MRI ordered last visit.  Patient presents to review the MRI results and discuss further treatment options  Past Medical History:  Diagnosis Date   Anxiety    panic attacks   C. difficile diarrhea 2014   Depression    past hx anxiety, not depression   Diarrhea    Esophageal reflux    Gestational diabetes    Hypothyroid    in her 20's x 1 yr synthroid only   Irritable bowel syndrome    Migraine, unspecified, without mention of intractable migraine without mention of status migrainosus    Panic disorder without agoraphobia    Vomiting alone     Past Surgical History:  Procedure Laterality Date   APPENDECTOMY     CHOLECYSTECTOMY  08/19/2011   Procedure: LAPAROSCOPIC CHOLECYSTECTOMY WITH INTRAOPERATIVE CHOLANGIOGRAM;  Surgeon: Velora Heckler, MD;  Location: [facility];  Service: General;  Laterality: N/A;   COLONOSCOPY  2011   DB   WISDOM TOOTH EXTRACTION      Allergies  Allergen Reactions   Dust Mite Extract Swelling   Medrol [Methylprednisolone] Nausea And Vomiting   Sertraline Hcl Other (See Comments)   Baclofen Anxiety and Palpitations   Novocain [Procaine] Palpitations     Physical Exam: General: The patient is alert and oriented x3 in no acute distress.  Dermatology: Skin is warm, dry and supple bilateral lower extremities. Negative for open lesions or  macerations.  Vascular: Palpable pedal pulses bilaterally. Capillary refill within normal limits.  Negative for any significant edema or erythema  Neurological: Light touch and protective threshold grossly intact  Musculoskeletal Exam: No pedal deformities noted.  There is a nonadherent small symptomatic soft tissue mass to the lateral aspect of the dorsum of the posterior tubercle of the calcaneus adjacent to the insertion of the Achilles tendon.  Very sensitive and elicits pain with palpation of the mass.  It is malleable and not adhered.  Radiographic Exam 05/20/2022 LT foot:  Normal osseous mineralization. Joint spaces preserved. No fracture/dislocation/boney destruction.  Posterior and plantar heel spurs noted which are noncontributory to the patient's symptoms  MR HEEL LT W WO CONTRAST 06/06/2022: IMPRESSION: 1. Essentially unremarkable pre and post-contrast MRI of the left ankle. There is no abnormality seen at the posterolateral aspect of the ankle near the Achilles tendon insertion. Achilles tendon is normal in appearance without tendinosis or tear. 2. No internal derangement of the left ankle. 3. Slight pes cavovarus alignment.  Assessment: 1.  Palpable soft tissue lesion posterior lateral tubercle of the calcaneus adjacent to the insertion of the Achilles tendon   Plan of Care:  1. Patient evaluated.  2.  Injection of 0.5 cc Celestone Soluspan injected into the soft tissue lesion.  Care taken to avoid injection into the Achilles  tendon. 3.  We also did discuss the possibility of excision of the lesion if cortisone injection does not help alleviate her symptoms.  She will reach out via MyChart in approximately 4 weeks for feedback.  We did discuss surgery in detail.  Risk benefits advantages and disadvantages all explained.  No guarantees were expressed or implied.  She would like to proceed with surgery and has no relief of symptoms in 4 weeks she may come in and sign the surgical  consent forms.  Authorization for surgery will include excision of benign lesion left heel  *Stay-at-home mom      Edrick Kins, DPM [facility]  Dr. Edrick Kins, DPM    [address], [place] [address]                Office [phone]  Fax [phone]

## 2022-07-08 DIAGNOSIS — I10 Essential (primary) hypertension: Secondary | ICD-10-CM | POA: Diagnosis not present

## 2022-07-10 ENCOUNTER — Telehealth: Payer: Self-pay | Admitting: *Deleted

## 2022-07-10 NOTE — Telephone Encounter (Signed)
[name] PA, Key: [facility]  [id number], immediately approved. Effective from 07/10/2022 through 10/01/2022.

## 2022-07-16 ENCOUNTER — Telehealth: Payer: Self-pay | Admitting: Neurology

## 2022-07-16 NOTE — Telephone Encounter (Signed)
LVM and sent mychart msg informing pt of appointment change from 10/09/22 to 10/06/22 due to provider template change

## 2022-07-17 DIAGNOSIS — N644 Mastodynia: Secondary | ICD-10-CM | POA: Diagnosis not present

## 2022-07-18 ENCOUNTER — Other Ambulatory Visit: Payer: Self-pay | Admitting: Family Medicine

## 2022-07-18 ENCOUNTER — Other Ambulatory Visit: Payer: Self-pay | Admitting: Obstetrics & Gynecology

## 2022-07-18 DIAGNOSIS — N644 Mastodynia: Secondary | ICD-10-CM

## 2022-07-30 ENCOUNTER — Emergency Department (HOSPITAL_COMMUNITY): Payer: BC Managed Care – PPO

## 2022-07-30 ENCOUNTER — Other Ambulatory Visit: Payer: Self-pay

## 2022-07-30 ENCOUNTER — Encounter (HOSPITAL_COMMUNITY): Payer: Self-pay | Admitting: Emergency Medicine

## 2022-07-30 ENCOUNTER — Emergency Department (HOSPITAL_COMMUNITY)
Admission: EM | Admit: 2022-07-30 | Discharge: 2022-07-30 | Disposition: A | Discharge status: Home / Self Care | Payer: BC Managed Care – PPO | Attending: Emergency Medicine | Admitting: Emergency Medicine

## 2022-07-30 DIAGNOSIS — T68XXXA Hypothermia, initial encounter: Secondary | ICD-10-CM | POA: Diagnosis not present

## 2022-07-30 DIAGNOSIS — R457 State of emotional shock and stress, unspecified: Secondary | ICD-10-CM | POA: Diagnosis not present

## 2022-07-30 DIAGNOSIS — I959 Hypotension, unspecified: Secondary | ICD-10-CM | POA: Diagnosis not present

## 2022-07-30 DIAGNOSIS — R001 Bradycardia, unspecified: Secondary | ICD-10-CM | POA: Diagnosis not present

## 2022-07-30 DIAGNOSIS — R0789 Other chest pain: Secondary | ICD-10-CM | POA: Insufficient documentation

## 2022-07-30 DIAGNOSIS — R079 Chest pain, unspecified: Secondary | ICD-10-CM | POA: Diagnosis not present

## 2022-07-30 LAB — BASIC METABOLIC PANEL
Anion gap: 9 (ref 5–15)
BUN: 9 mg/dL (ref 6–20)
CO2: 25 mmol/L (ref 22–32)
Calcium: 9 mg/dL (ref 8.9–10.3)
Chloride: 102 mmol/L (ref 98–111)
Creatinine, Ser: 0.53 mg/dL (ref 0.44–1.00)
GFR, Estimated: >60 mL/min (ref >60)
Glucose, Bld: 116 mg/dL — ABNORMAL HIGH (ref 70–99)
Potassium: 3.6 mmol/L (ref 3.5–5.1)
Sodium: 136 mmol/L (ref 135–145)

## 2022-07-30 LAB — TROPONIN I (HIGH SENSITIVITY)
Troponin I (High Sensitivity): <2 ng/L (ref <18)
Troponin I (High Sensitivity): <2 ng/L (ref <18)

## 2022-07-30 LAB — CBC
HCT: 41.4 % (ref 36.0–46.0)
Hemoglobin: 13.9 g/dL (ref 12.0–15.0)
MCH: 31.5 pg (ref 26.0–34.0)
MCHC: 33.6 g/dL (ref 30.0–36.0)
MCV: 93.9 fL (ref 80.0–100.0)
Platelets: 310 10*3/uL (ref 150–400)
RBC: 4.41 MIL/uL (ref 3.87–5.11)
RDW: 12.1 % (ref 11.5–15.5)
WBC: 9.4 10*3/uL (ref 4.0–10.5)
nRBC: 0 % (ref 0.0–0.2)

## 2022-07-30 LAB — I-STAT BETA HCG BLOOD, ED (MC, WL, AP ONLY): I-stat hCG, quantitative: <5 m[IU]/mL (ref <5)

## 2022-07-30 MED ORDER — ACETAMINOPHEN 500 MG PO TABS
1000.0000 mg | ORAL_TABLET | Freq: Once | ORAL | Status: AC
  Administered 2022-07-30: 1000 mg via ORAL
  Filled 2022-07-30: qty 2

## 2022-07-30 MED ORDER — LORAZEPAM 1 MG PO TABS
1.0000 mg | ORAL_TABLET | Freq: Once | ORAL | Status: AC
  Administered 2022-07-30: 1 mg via ORAL
  Filled 2022-07-30: qty 1

## 2022-07-30 NOTE — ED Provider Notes (Signed)
[facility]GENCY DEPT Provider Note   CSN: [id number] Arrival date & time: 07/30/22  1209     History  Chief Complaint  Patient presents with   Arm Pain   Chest Pain    Lori Marshall is a 45 y.o. female.  46 year old female with prior medical history as detailed below presents for evaluation.  Patient with complaint of intermittent left-sided chest pain.  Patient reports that she has had similar episodes of pain intermittently for the last 3 months.  She reports an episode this morning where she had squeezing discomfort to the left chest.  Symptoms lasted approximately 5 seconds while she was driving.  After her this very brief episode of discomfort she was able to drive home.  The squeezing pressure did not return.  She did develop subsequent " flushing, generalized weakness".  She reports longstanding issues with anxiety.  She is not convinced that her anxiety has not really somewhat responsible for her symptoms yet.  She denies current chest discomfort or shortness of breath.  She denies associated nausea, vomiting, palpitations, fever, other complaint.  The history is provided by the patient and medical records.       Home Medications Prior to Admission medications   Medication Sig Start Date End Date Taking? Authorizing Provider  acyclovir (ZOVIRAX) 800 MG tablet     [provider]  ALPRAZolam (XANAX) 0.5 MG tablet Take 0.5 mg by mouth daily as needed. 11/08/21   [provider]  Chlorphen-Pseudoeph-Methscop (ALLERGY DN PO)  08/04/18   [provider]  cyclobenzaprine (FLEXERIL) 10 MG tablet 1 tablet as needed up to 3x a day for muscle spasms or muscle tension 06/05/22   Anson Fret, MD  diphenhydrAMINE HCl (BENADRYL ALLERGY PO)     [provider]  famotidine (PEPCID) 20 MG tablet 1 tablet at bedtime as needed    [provider]  hydrochlorothiazide (HYDRODIURIL) 12.5 MG tablet Take 12.5 mg  by mouth every morning. 05/27/22   [provider]  ibuprofen (ADVIL) 600 MG tablet Take 1 tablet (600 mg total) by mouth every 6 (six) hours as needed. 11/06/20   Arby Barrette, MD  MELATONIN PO     [provider]  nystatin (MYCOSTATIN/NYSTOP) powder nystatin 100,000 unit/gram topical powder    [provider]  Omega-3 Fatty Acids (FISH OIL PO) Take by mouth 2 (two) times daily. 4 tablets 2 morning,2 night    [provider]  Oxymetazoline HCl (RHOFADE) 1 % CREA Rhofade 1 % topical cream  EVENLY APPLY A PEA SIZE AMOUNT TO THE FACE EVERY MORNING. WASH HANDS AFTER USE    [provider]  promethazine (PHENERGAN) 25 MG tablet Take 1 tablet (25 mg total) by mouth every 8 (eight) hours as needed for nausea or vomiting. 06/05/22   Anson Fret, MD  REPATHA SURECLICK 140 MG/ML SOAJ SMARTSIG:140 Milligram(s) SUB-Q Every 2 Weeks 05/26/22   [provider]  rizatriptan (MAXALT-MLT) 10 MG disintegrating tablet Take 1 tablet (10 mg total) by mouth as needed for migraine. May repeat in 2 hours if needed 06/05/22   Anson Fret, MD  Semaglutide, 1 MG/DOSE, (OZEMPIC, 1 MG/DOSE,) 2 MG/1.5ML [facility]  03/12/22   [provider]  triamcinolone cream (KENALOG) 0.1 % Apply 1 application. topically 2 (two) times daily. 12/04/21   Daphine Deutscher, Mary-Margaret, FNP  Ubrogepant (UBRELVY) 100 MG TABS Take 100 mg by mouth every 2 (two) hours as needed. Maximum 200mg  a day. 06/05/22  Melvenia Beam, MD  Ubrogepant (UBRELVY) 100 MG TABS Take 100 mg by mouth every 2 (two) hours as needed. Maximum 200mg  a day. 06/08/22   Melvenia Beam, MD  UNABLE TO FIND Essential oils 08/04/18   [provider]  valACYclovir (VALTREX) 1000 MG tablet take 2 tablets at the first sign of fever blister. 12 hours later take 2 more , then stop 10/11/21   [provider]      Allergies    Dust mite extract, Medrol [methylprednisolone], Sertraline hcl, Baclofen, and Novocain  [procaine]    Review of Systems   Review of Systems  All other systems reviewed and are negative.   Physical Exam Updated Vital Signs BP 118/81   Pulse 81   Temp 98 F (36.7 C) (Oral)   Resp (!) 21   Ht 5\' 6"  (1.676 m)   Wt 78 kg   LMP 07/09/2022 (Approximate)   SpO2 94%   BMI 27.76 kg/m  Physical Exam Vitals and nursing note reviewed.  Constitutional:      General: She is not in acute distress.    Appearance: Normal appearance. She is well-developed.     Comments: Alert, visibly anxious  HENT:     Head: Normocephalic and atraumatic.  Eyes:     Conjunctiva/sclera: Conjunctivae normal.     Pupils: Pupils are equal, round, and reactive to light.  Cardiovascular:     Rate and Rhythm: Normal rate and regular rhythm.     Heart sounds: Normal heart sounds.  Pulmonary:     Effort: Pulmonary effort is normal. No respiratory distress.     Breath sounds: Normal breath sounds.  Abdominal:     General: There is no distension.     Palpations: Abdomen is soft.     Tenderness: There is no abdominal tenderness.  Musculoskeletal:        General: No deformity. Normal range of motion.     Cervical back: Normal range of motion and neck supple.  Skin:    General: Skin is warm and dry.  Neurological:     General: No focal deficit present.     Mental Status: She is alert and oriented to person, place, and time.     ED Results / Procedures / Treatments   Labs (all labs ordered are listed, but only abnormal results are displayed) Labs Reviewed  BASIC METABOLIC PANEL - Abnormal; Notable for the following components:      Result Value   Glucose, Bld 116 (*)    All other components within normal limits  CBC  I-STAT BETA HCG BLOOD, ED ([facility], [facility], [facility] ONLY)  TROPONIN I (HIGH SENSITIVITY)  TROPONIN I (HIGH SENSITIVITY)    EKG EKG Interpretation  Date/Time:  Wednesday July 30 2022 12:19:35 EST Ventricular Rate:  82 PR Interval:  162 QRS Duration: 103 QT Interval:  373 QTC  Calculation: 436 R Axis:   81 Text Interpretation: Sinus rhythm Confirmed by Dene Gentry [phone]) on 07/30/2022 4:05:27 PM  Radiology DG Chest 2 View  Result Date: 07/30/2022 CLINICAL DATA:  Chest pain and tightness. EXAM: CHEST - 2 VIEW COMPARISON:  One-view chest x-ray 08/07/2013 FINDINGS: Heart size is normal. Lungs are clear. No edema or effusion is present. Mild degenerative changes are present in the thoracic spine. The visualized soft tissues and bony thorax are otherwise unremarkable. IMPRESSION: No acute cardiopulmonary disease. Electronically Signed   By: San Morelle M.D.   On: 07/30/2022 12:50    Procedures Procedures  Medications Ordered in ED Medications  LORazepam (ATIVAN) tablet 1 mg (1 mg Oral Given 07/30/22 1714)    ED Course/ Medical Decision Making/ A&P                           Medical Decision Making Risk Prescription drug management.    Medical Screen Complete  This patient presented to the ED with complaint of chest discomfort.  This complaint involves an extensive number of treatment options. The initial differential diagnosis includes, but is not limited to, ACS, angina, metabolic abnormality, anxiety, etc.  This presentation is: Acute, Self-Limited, Previously Undiagnosed, Uncertain Prognosis, Complicated, Systemic Symptoms, and Threat to Life/Bodily Function  Patient is presenting with atypical chest discomfort.  Describe symptoms are not consistent with likely ACS.  EKG is without evidence of acute ischemia.  Troponin x2 is undetectable.  Other screening labs obtained are without significant abnormality.  Patient is feeling improved after reassuring lab and imaging work-up here in the ED.  Additionally patient feels that the Ativan dose may have helped her anxiety.  Importance of close follow-up is stressed.  Strict return precautions given and understood.    Additional history obtained:  External records from outside  sources obtained and reviewed including prior ED visits and prior Inpatient records.    Lab Tests:  I ordered and personally interpreted labs.  The pertinent results include: CBC, BMP, hCG, troponin x2   Imaging Studies ordered:  I ordered imaging studies including chest x-ray I independently visualized and interpreted obtained imaging which showed NAD I agree with the radiologist interpretation.   Cardiac Monitoring:  The patient was maintained on a cardiac monitor.  I personally viewed and interpreted the cardiac monitor which showed an underlying rhythm of: NSR   Medicines ordered:  I ordered medication including Ativan for anxiety Reevaluation of the patient after these medicines showed that the patient: improved  Problem List / ED Course:   Atypical chest pain   Reevaluation:  After the interventions noted above, I reevaluated the patient and found that they have: resolved   Disposition:  After consideration of the diagnostic results and the patients response to treatment, I feel that the patent would benefit from close outpatient followup.          Final Clinical Impression(s) / ED Diagnoses Final diagnoses:  Atypical chest pain    Rx / DC Orders ED Discharge Orders          Ordered    Ambulatory referral to Cardiology       Comments: If you have not heard from the Cardiology office within the next 72 hours please call [phone].   07/30/22 1905              Valarie Merino, MD 07/30/22 1910

## 2022-07-30 NOTE — Discharge Instructions (Signed)
Return for any problem.  ?

## 2022-07-30 NOTE — ED Provider Triage Note (Signed)
Emergency Medicine Provider Triage Evaluation Note  Lori Marshall , a 46 y.o. female  was evaluated in triage.  Pt complains of intermittent left-sided chest pain.  Patient states she initially noticed some pain in the left upper chest area approximately 3 months ago.  She states that while driving today she had 2 back-to-back episodes of severe chest tightness described as feeling like someone is squeezing her heart.  This was followed by a brief period of relief and the pain resumed.  She denies shortness of breath, abdominal pain, vomiting.  Does endorse nausea and left-sided arm numbness/tingling that coincided with the chest discomfort.  She endorses a history of anxiety and took anxiety medication prior to calling 911 for transport.  Patient states that her OB/GYN moved her mammogram up because she felt like there may be some swelling in the left breast area and the upcoming mammogram is on December 7.  Review of Systems  Positive: As above Negative: As above  Physical Exam  BP (!) 134/107   Pulse 82   Temp 98.2 F (36.8 C) (Oral)   Resp 18   Ht 5\' 6"  (1.676 m)   Wt 78 kg   SpO2 98%   BMI 27.76 kg/m  Gen:   Awake, no distress   Resp:  Normal effort  MSK:   Moves extremities without difficulty  Other:    Medical Decision Making  Medically screening exam initiated at 12:23 PM.  Appropriate orders placed.  Lori Marshall was informed that the remainder of the evaluation will be completed by another provider, this initial triage assessment does not replace that evaluation, and the importance of remaining in the ED until their evaluation is complete.     [name], PA-C 07/30/22 1231

## 2022-07-30 NOTE — ED Triage Notes (Signed)
Per [facility] pt coming from home- c/o left arm/ armpit / chest tightness intermittently x 3 months. More intense this morning while driving. Patient reports dull feeling felt better after massaging area Patient reports feeling very anxious after pain and took 1 of her anxiety meds.

## 2022-08-04 ENCOUNTER — Ambulatory Visit: Payer: BC Managed Care – PPO | Attending: Cardiovascular Disease | Admitting: Cardiovascular Disease

## 2022-08-04 ENCOUNTER — Encounter: Payer: Self-pay | Admitting: Cardiovascular Disease

## 2022-08-04 VITALS — BP 114/80 | HR 82 | Ht 66.0 in | Wt 167.8 lb

## 2022-08-04 DIAGNOSIS — R079 Chest pain, unspecified: Secondary | ICD-10-CM

## 2022-08-04 DIAGNOSIS — E039 Hypothyroidism, unspecified: Secondary | ICD-10-CM | POA: Insufficient documentation

## 2022-08-04 NOTE — Progress Notes (Signed)
Chief Complaint  Patient presents with   New Patient (Initial Visit)    Chest pain   History of Present Illness: 46 yo female with history of anxiety, GERD, hypothyroidism, IBS and migraines here today as a new consult, referred by Dr. Waynard Edwards, for the evaluation of chest pain. She was seen in the ED at [facility] 07/30/22 with chest pain. EKG without ischemic changes. Troponin negative. She tells me today that she had been driving and began having squeezing chest pain that lasted for 3 seconds, two different times. The pain radiated to her left shoulder and arm. She had associated diaphoresis with no dyspnea. No recurrence since then.   Primary Care Physician: Rodrigo Ran, MD   Past Medical History:  Diagnosis Date   Anxiety    panic attacks   C. difficile diarrhea 2014   Depression    past hx anxiety, not depression   Diarrhea    Esophageal reflux    Gestational diabetes    Hypothyroid    in her 20's x 1 yr synthroid only   Irritable bowel syndrome    Migraine, unspecified, without mention of intractable migraine without mention of status migrainosus    Panic disorder without agoraphobia    Vomiting alone     Past Surgical History:  Procedure Laterality Date   APPENDECTOMY     CHOLECYSTECTOMY  08/19/2011   Procedure: LAPAROSCOPIC CHOLECYSTECTOMY WITH INTRAOPERATIVE CHOLANGIOGRAM;  Surgeon: Velora Heckler, MD;  Location: [facility];  Service: General;  Laterality: N/A;   COLONOSCOPY  2011   DB   WISDOM TOOTH EXTRACTION      Current Outpatient Medications  Medication Sig Dispense Refill   acyclovir (ZOVIRAX) 800 MG tablet      ALPRAZolam (XANAX) 0.5 MG tablet Take 0.5 mg by mouth daily as needed.     Chlorphen-Pseudoeph-Methscop (ALLERGY DN PO)      cyclobenzaprine (FLEXERIL) 10 MG tablet 1 tablet as needed up to 3x a day for muscle spasms or muscle tension 60 tablet 6   diphenhydrAMINE HCl (BENADRYL ALLERGY PO)      famotidine (PEPCID) 20 MG tablet 1 tablet at  bedtime as needed     hydrochlorothiazide (HYDRODIURIL) 12.5 MG tablet Take 25 mg by mouth daily.     ibuprofen (ADVIL) 600 MG tablet Take 1 tablet (600 mg total) by mouth every 6 (six) hours as needed. 30 tablet 0   MELATONIN PO      nystatin (MYCOSTATIN/NYSTOP) powder nystatin 100,000 unit/gram topical powder     Omega-3 Fatty Acids (FISH OIL PO) Take by mouth 2 (two) times daily. 4 tablets 2 morning,2 night     Oxymetazoline HCl (RHOFADE) 1 % CREA Rhofade 1 % topical cream  EVENLY APPLY A PEA SIZE AMOUNT TO THE FACE EVERY MORNING. WASH HANDS AFTER USE     promethazine (PHENERGAN) 25 MG tablet Take 1 tablet (25 mg total) by mouth every 8 (eight) hours as needed for nausea or vomiting. 30 tablet 11   propranolol (INDERAL) 10 MG tablet Take 10 mg by mouth daily.     REPATHA SURECLICK 140 MG/ML SOAJ SMARTSIG:140 Milligram(s) SUB-Q Every 2 Weeks     rizatriptan (MAXALT-MLT) 10 MG disintegrating tablet Take 1 tablet (10 mg total) by mouth as needed for migraine. May repeat in 2 hours if needed 9 tablet 11   Semaglutide, 1 MG/DOSE, (OZEMPIC, 1 MG/DOSE,) 2 MG/1.5ML SOPN      triamcinolone cream (KENALOG) 0.1 % Apply 1 application. topically 2 (  two) times daily. 453.6 g 0   Ubrogepant (UBRELVY) 100 MG TABS Take 100 mg by mouth every 2 (two) hours as needed. Maximum 200mg  a day. 16 tablet 11   UNABLE TO FIND Essential oils     valACYclovir (VALTREX) 1000 MG tablet take 2 tablets at the first sign of fever blister. 12 hours later take 2 more , then stop     No current facility-administered medications for this visit.    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   Occupation: Stay at home mom  Tobacco Use   Smoking status: Former    Packs/day: 0.50    Years: 5.00    Total pack years: 2.50    Types: Cigarettes    Quit date: 2020    Years since quitting: 3.9   Smokeless tobacco: Never  Vaping Use   Vaping Use: Never used  Substance and Sexual Activity   Alcohol use: Yes    Comment: socially; last drank champagne feb 2021   Drug use: No   Sexual activity: Not Currently    Birth control/protection: None  Other Topics Concern   Not on file  Social History Narrative   ** Merged History Encounter **       Patient contact person: Mar 2021  Lenon Oms   Social Determinants of Health   Financial Resource Strain: Not on file  Food Insecurity: Not on file  Transportation Needs: Not on file  Physical Activity: Not on file  Stress: Not on file  Social Connections: Not on file  Intimate Partner Violence: Not on file    Family History  Problem Relation Age of Onset   Migraines Mother    Diabetes Father    Melanoma Father    Colon polyps Father    Colon cancer Neg Hx    Esophageal cancer Neg Hx    Rectal cancer Neg Hx    Stomach cancer Neg Hx     Review of Systems:  As stated in the HPI and otherwise negative.   BP 114/80   Pulse 82   Ht 5\' 6"  (1.676 m)   Wt 167 lb 12.8 oz (76.1 kg)   LMP 07/09/2022 (Approximate)   SpO2 99%   BMI 27.08 kg/m   Physical Examination: General: Well developed, well nourished, NAD  HEENT: OP clear, mucus membranes moist  SKIN: warm, dry. No rashes. Neuro: No focal deficits  Musculoskeletal: Muscle strength 5/5 all ext  Psychiatric: Mood and affect normal  Neck: No JVD, no carotid bruits, no thyromegaly, no lymphadenopathy.  Lungs:Clear bilaterally, no wheezes, rhonci, crackles Cardiovascular: Regular rate and rhythm. No murmurs, gallops or rubs. Abdomen:Soft. Bowel sounds  present. Non-tender.  Extremities: No lower extremity edema. Pulses are 2 + in the bilateral DP/PT.  EKG:  EKG is not ordered today. The ekg from 07/30/22 show sinus  Recent Labs: 07/30/2022: BUN 9; Creatinine, Ser 0.53; Hemoglobin 13.9; Platelets 310; Potassium 3.6; Sodium 136   Lipid Panel No results found for: "CHOL", "TRIG", "  HDL", "CHOLHDL", "VLDL", "LDLCALC", "LDLDIRECT"   Wt Readings from Last 3 Encounters:  08/04/22 167 lb 12.8 oz (76.1 kg)  07/30/22 172 lb (78 kg)  06/05/22 172 lb (78 kg)     Assessment and Plan:   1. Chest pain, atypical: Given her risk factors of HTN and HLD, will arrange an echo to assess LVEF and an exercise stress test to exclude ischemia.   Labs/ tests ordered today include:   Orders Placed This Encounter  Procedures   EXERCISE TOLERANCE TEST (ETT)   ECHOCARDIOGRAM COMPLETE   Disposition:   F/U with me as needed   Signed, Verne Carrow, MD, [facility] 08/04/2022 3:01 PM    [facility] [address] [place], [place], [place]  [address] Phone: [phone]; Fax: [phone]

## 2022-08-04 NOTE — Patient Instructions (Signed)
Medication Instructions:  No changes *If you need a refill on your cardiac medications before your next appointment, please call your pharmacy*   Lab Work: none   Testing/Procedures: Your physician has requested that you have an echocardiogram. Echocardiography is a painless test that uses sound waves to create images of your heart. It provides your doctor with information about the size and shape of your heart and how well your heart's chambers and valves are working. This procedure takes approximately one hour. There are no restrictions for this procedure. Please do NOT wear cologne, perfume, aftershave, or lotions (deodorant is allowed). Please arrive 15 minutes prior to your appointment time.  Your physician has requested that you have an exercise tolerance test. For further information please visit [email or web address] Please also follow instruction sheet, as given.   Follow-Up: As needed  Important Information About Sugar

## 2022-08-07 ENCOUNTER — Ambulatory Visit
Admission: RE | Admit: 2022-08-07 | Discharge: 2022-08-07 | Disposition: A | Discharge status: Home / Self Care | Payer: BC Managed Care – PPO | Source: Ambulatory Visit | Attending: Obstetrics & Gynecology | Admitting: Obstetrics & Gynecology

## 2022-08-07 ENCOUNTER — Other Ambulatory Visit: Payer: Self-pay | Admitting: Obstetrics & Gynecology

## 2022-08-07 ENCOUNTER — Ambulatory Visit: Admission: RE | Admit: 2022-08-07 | Payer: BC Managed Care – PPO | Source: Ambulatory Visit

## 2022-08-07 ENCOUNTER — Telehealth: Payer: BC Managed Care – PPO | Admitting: Physician Assistant

## 2022-08-07 DIAGNOSIS — N644 Mastodynia: Secondary | ICD-10-CM | POA: Diagnosis not present

## 2022-08-07 DIAGNOSIS — B372 Candidiasis of skin and nail: Secondary | ICD-10-CM | POA: Diagnosis not present

## 2022-08-07 MED ORDER — NYSTATIN 100000 UNIT/GM EX CREA: 1.0000 | TOPICAL_CREAM | Freq: Two times a day (BID) | CUTANEOUS | 0 refills | Status: AC

## 2022-08-07 MED ORDER — FLUCONAZOLE 150 MG PO TABS: ORAL_TABLET | ORAL | 0 refills | Status: AC

## 2022-08-07 NOTE — Progress Notes (Signed)
E Visit for Rash  We are sorry that you are not feeling well. Here is how we plan to help!   Based upon your presentation it appears you have a fungal/yeast infection.  I have prescribed: and Nystatin cream apply to the affected area twice daily. I will send in diflucan to help expedite resolution.    HOME CARE:  Take cool showers and avoid direct sunlight. Apply cool compress or wet dressings. Take a bath in an oatmeal bath.  Sprinkle content of one Aveeno packet under running faucet with comfortably warm water.  Bathe for 15-20 minutes, 1-2 times daily.  Pat dry with a towel. Do not rub the rash. Use hydrocortisone cream. Take an antihistamine like Benadryl for widespread rashes that itch.  The adult dose of Benadryl is 25-50 mg by mouth 4 times daily. Caution:  This type of medication may cause sleepiness.  Do not drink alcohol, drive, or operate dangerous machinery while taking antihistamines.  Do not take these medications if you have prostate enlargement.  Read package instructions thoroughly on all medications that you take.  GET HELP RIGHT AWAY IF:  Symptoms don't go away after treatment. Severe itching that persists. If you rash spreads or swells. If you rash begins to smell. If it blisters and opens or develops a yellow-brown crust. You develop a fever. You have a sore throat. You become short of breath.  MAKE SURE YOU:  Understand these instructions. Will watch your condition. Will get help right away if you are not doing well or get worse.  Thank you for choosing an e-visit.  Your e-visit answers were reviewed by a board certified advanced clinical practitioner to complete your personal care plan. Depending upon the condition, your plan could have included both over the counter or prescription medications.  Please review your pharmacy choice. Make sure the pharmacy is open so you can pick up prescription now. If there is a problem, you may contact your provider  through [employer] and have the prescription routed to another pharmacy.  Your safety is important to [place]. If you have drug allergies check your prescription carefully.   For the next 24 hours you can use MyChart to ask questions about today's visit, request a non-urgent call back, or ask for a work or school excuse. You will get an email in the next two days asking about your experience. I hope that your e-visit has been valuable and will speed your recovery.

## 2022-08-07 NOTE — Progress Notes (Signed)
I have spent 5 minutes in review of e-visit questionnaire, review and updating patient chart, medical decision making and response to patient.   Khadar Monger Cody Cheridan Kibler, PA-C    

## 2022-08-19 DIAGNOSIS — Z6827 Body mass index (BMI) 27.0-27.9, adult: Secondary | ICD-10-CM | POA: Diagnosis not present

## 2022-08-19 DIAGNOSIS — Z01419 Encounter for gynecological examination (general) (routine) without abnormal findings: Secondary | ICD-10-CM | POA: Diagnosis not present

## 2022-08-25 ENCOUNTER — Telehealth: Payer: BC Managed Care – PPO | Admitting: Physician Assistant

## 2022-08-25 DIAGNOSIS — R3989 Other symptoms and signs involving the genitourinary system: Secondary | ICD-10-CM | POA: Diagnosis not present

## 2022-08-25 MED ORDER — CEPHALEXIN 500 MG PO CAPS: 500.0000 mg | ORAL_CAPSULE | Freq: Two times a day (BID) | ORAL | 0 refills | Status: AC

## 2022-08-25 NOTE — Progress Notes (Signed)
I have spent 5 minutes in review of e-visit questionnaire, review and updating patient chart, medical decision making and response to patient.   Jory Welke Cody Netty Sullivant, PA-C    

## 2022-08-25 NOTE — Progress Notes (Signed)

## 2022-08-26 ENCOUNTER — Ambulatory Visit (HOSPITAL_COMMUNITY): Payer: BC Managed Care – PPO | Attending: Cardiovascular Disease

## 2022-08-26 ENCOUNTER — Ambulatory Visit (INDEPENDENT_AMBULATORY_CARE_PROVIDER_SITE_OTHER): Payer: BC Managed Care – PPO

## 2022-08-26 DIAGNOSIS — R079 Chest pain, unspecified: Secondary | ICD-10-CM

## 2022-08-26 LAB — ECHOCARDIOGRAM COMPLETE
Area-P 1/2: 3.12 cm2
S' Lateral: 2.6 cm

## 2022-08-27 ENCOUNTER — Ambulatory Visit (INDEPENDENT_AMBULATORY_CARE_PROVIDER_SITE_OTHER): Payer: BC Managed Care – PPO | Admitting: Plastic Surgery

## 2022-08-27 ENCOUNTER — Encounter: Payer: Self-pay | Admitting: Plastic Surgery

## 2022-08-27 VITALS — BP 134/87 | HR 84 | Temp 98.1°F | Resp 16 | Ht 66.0 in | Wt 168.0 lb

## 2022-08-27 DIAGNOSIS — R21 Rash and other nonspecific skin eruption: Secondary | ICD-10-CM | POA: Diagnosis not present

## 2022-08-27 DIAGNOSIS — M542 Cervicalgia: Secondary | ICD-10-CM | POA: Diagnosis not present

## 2022-08-27 DIAGNOSIS — M546 Pain in thoracic spine: Secondary | ICD-10-CM | POA: Diagnosis not present

## 2022-08-27 DIAGNOSIS — N62 Hypertrophy of breast: Secondary | ICD-10-CM | POA: Diagnosis not present

## 2022-08-27 DIAGNOSIS — Z6827 Body mass index (BMI) 27.0-27.9, adult: Secondary | ICD-10-CM

## 2022-08-27 NOTE — Progress Notes (Signed)
Referring Provider Lori Infante, MD [address] [place],  [place] [address]   CC:  Chief Complaint  Patient presents with   Consult      Lori Marshall is an 46 y.o. female.  HPI: [name] presents today for evaluation for breast reduction.  She states that the large size of her breast have caused her to have upper back and neck pain but more concerning to her or her persistent inframammary rashes.  She just completed a 2-day course of Diflucan and has used topical antifungals for prolonged periods of time without resolution of the infection.  She is interested in breast reduction.   Allergies  Allergen Reactions   Dust Mite Extract Swelling   Medrol [Methylprednisolone] Nausea And Vomiting   Sertraline Hcl Other (See Comments)   Baclofen Anxiety and Palpitations   Novocain [Procaine] Palpitations    Outpatient Encounter Medications as of 08/27/2022  Medication Sig Note   acyclovir (ZOVIRAX) 800 MG tablet     ALPRAZolam (XANAX) 0.5 MG tablet Take 0.5 mg by mouth daily as needed.    cephALEXin (KEFLEX) 500 MG capsule Take 1 capsule (500 mg total) by mouth 2 (two) times daily for 7 days.    Chlorphen-Pseudoeph-Methscop (ALLERGY DN PO)     cyclobenzaprine (FLEXERIL) 10 MG tablet 1 tablet as needed up to 3x a day for muscle spasms or muscle tension    diphenhydrAMINE HCl (BENADRYL ALLERGY PO)     famotidine (PEPCID) 20 MG tablet 1 tablet at bedtime as needed    fluconazole (DIFLUCAN) 150 MG tablet Take 1 tablet PO once. Repeat in 3 days if needed.    hydrochlorothiazide (HYDRODIURIL) 12.5 MG tablet Take 25 mg by mouth daily.    ibuprofen (ADVIL) 600 MG tablet Take 1 tablet (600 mg total) by mouth every 6 (six) hours as needed.    MELATONIN PO     nystatin cream (MYCOSTATIN) Apply 1 Application topically 2 (two) times daily.    Omega-3 Fatty Acids (FISH OIL PO) Take by mouth 2 (two) times daily. 4 tablets 2 morning,2 night    Oxymetazoline HCl (RHOFADE) 1 % CREA  Rhofade 1 % topical cream  EVENLY APPLY A PEA SIZE AMOUNT TO THE FACE EVERY MORNING. WASH HANDS AFTER USE    promethazine (PHENERGAN) 25 MG tablet Take 1 tablet (25 mg total) by mouth every 8 (eight) hours as needed for nausea or vomiting.    propranolol (INDERAL) 10 MG tablet Take 10 mg by mouth daily.    REPATHA SURECLICK [id number] MG/ML SOAJ [id number] Milligram(s) SUB-Q Every 2 Weeks    rizatriptan (MAXALT-MLT) 10 MG disintegrating tablet Take 1 tablet (10 mg total) by mouth as needed for migraine. May repeat in 2 hours if needed    Semaglutide, 1 MG/DOSE, (OZEMPIC, 1 MG/DOSE,) 2 MG/1.5ML SOPN     triamcinolone cream (KENALOG) 0.1 % Apply 1 application. topically 2 (two) times daily.    Ubrogepant (UBRELVY) 100 MG TABS Take 100 mg by mouth every 2 (two) hours as needed. Maximum 200mg  a day. 07/10/2022: Approved till 10/01/22   UNABLE TO FIND Essential oils    valACYclovir (VALTREX) 1000 MG tablet take 2 tablets at the first sign of fever blister. 12 hours later take 2 more , then stop    No facility-administered encounter medications on file as of 08/27/2022.     Past Medical History:  Diagnosis Date   Anxiety    panic attacks   C. difficile diarrhea 2014   Depression  past hx anxiety, not depression   Diarrhea    Esophageal reflux    Gestational diabetes    Hypothyroid    in her 20's x 1 yr synthroid only   Irritable bowel syndrome    Migraine, unspecified, without mention of intractable migraine without mention of status migrainosus    Panic disorder without agoraphobia    Vomiting alone     Past Surgical History:  Procedure Laterality Date   APPENDECTOMY     CHOLECYSTECTOMY  08/19/2011   Procedure: LAPAROSCOPIC CHOLECYSTECTOMY WITH INTRAOPERATIVE CHOLANGIOGRAM;  Surgeon: Velora Heckler, MD;  Location: [facility];  Service: General;  Laterality: N/A;   COLONOSCOPY  2011   DB   WISDOM TOOTH EXTRACTION      Family History  Problem Relation Age of Onset   Migraines Mother     Diabetes Father    Melanoma Father    Colon polyps Father    Colon cancer Neg Hx    Esophageal cancer Neg Hx    Rectal cancer Neg Hx    Stomach cancer Neg Hx     Social History   Social History Narrative   ** Merged History Encounter **       Patient contact person: Lori Marshall  [phone]     Review of Systems General: Denies fevers, chills, weight loss CV: Denies chest pain, shortness of breath, palpitations Breast: Pain due to the large size of her breasts, persistent rashes in the inframammary fold  Physical Exam    08/27/2022    2:24 PM 08/04/2022    2:29 PM 07/30/2022    6:30 PM  Vitals with BMI  Height 5\' 6"  5\' 6"    Weight 168 lbs 167 lbs 13 oz   BMI 27.13 27.1   Systolic 134 114  Diastolic 87 80 81  Pulse 84 82 81    General:  No acute distress,  Alert and oriented, Non-Toxic, Normal speech and affect Breast: No palpable masses no obvious nipple abnormalities or discharge.  Sternal notch to nipple distance on the right 32 cm on the left 33 cm fold to nipple 16 cm on the right and 16 cm on the left. Mammogram: Mammogram in December 2023 BI-RADS 1 Assessment/Plan Macromastia: The patient is a good candidate for bilateral breast reduction.  She has significant asymmetries of the breast which will persist after reduction.  I believe I can remove 500 g on the right and 600 g on the left. We discussed the procedure at length including the location of the incisions and the unpredictable nature of scarring.  We discussed the surgical risks of bleeding, infection, seroma formation.  We also discussed the risk of nipple loss due to ischemia.  She understands that there may be changes in nipple sensation and will certainly be a patches of loss of feeling on the breast skin.  Postoperative restrictions include no heavy lifting greater than 20 pounds, no vigorous exercise, and no submerging the incisions in water for 6 weeks.  Light activity may be resumed when she feels ready.   Photographs obtained today with her consent.  Will submit for bilateral breast reduction.  939 08/27/2022, 4:57 PM

## 2022-08-28 LAB — EXERCISE TOLERANCE TEST
Angina Index: 0
Duke Treadmill Score: 10
Estimated workload: 11.2
Exercise duration (min): 9 min
Exercise duration (sec): 41 s
MPHR: 175 {beats}/min
Peak HR: 155 {beats}/min
Percent HR: 88 %
RPE: 20
Rest HR: 77 {beats}/min
ST Depression (mm): 0 mm

## 2022-08-30 ENCOUNTER — Other Ambulatory Visit: Payer: Self-pay | Admitting: Family

## 2022-08-31 ENCOUNTER — Other Ambulatory Visit: Payer: Self-pay | Admitting: Family

## 2022-09-01 ENCOUNTER — Other Ambulatory Visit: Payer: Self-pay | Admitting: Family

## 2022-09-02 ENCOUNTER — Other Ambulatory Visit: Payer: Self-pay | Admitting: Family

## 2022-09-04 DIAGNOSIS — F410 Panic disorder [episodic paroxysmal anxiety] without agoraphobia: Secondary | ICD-10-CM | POA: Diagnosis not present

## 2022-10-06 ENCOUNTER — Telehealth: Payer: Self-pay | Admitting: *Deleted

## 2022-10-06 ENCOUNTER — Telehealth (INDEPENDENT_AMBULATORY_CARE_PROVIDER_SITE_OTHER): Payer: BC Managed Care – PPO | Admitting: Neurology

## 2022-10-06 ENCOUNTER — Encounter: Payer: Self-pay | Admitting: Neurology

## 2022-10-06 DIAGNOSIS — G43709 Chronic migraine without aura, not intractable, without status migrainosus: Secondary | ICD-10-CM

## 2022-10-06 NOTE — Telephone Encounter (Signed)
-----   Message from Melvenia Beam, MD sent at 10/06/2022  3:00 PM EST ----- Regarding: Botox for migraines Please start botox for migraine approval. G43.709. Point is to get her in asap whoever is available first to inject NP or Dr. Jaynee Eagles.

## 2022-10-06 NOTE — Patient Instructions (Signed)
Daily: Qulipta/atogepant As needed: Nurtec  Atogepant Tablets What is this medication? ATOGEPANT (a TOE je pant) prevents migraines. It works by blocking a substance in the body that causes migraines. This medicine may be used for other purposes; ask your health care provider or pharmacist if you have questions. COMMON BRAND NAME(S): QULIPTA What should I tell my care team before I take this medication? They need to know if you have any of these conditions: Kidney disease Liver disease An unusual or allergic reaction to atogepant, other medications, foods, dyes, or preservatives Pregnant or trying to get pregnant Breast-feeding How should I use this medication? Take this medication by mouth with water. Take it as directed on the prescription label at the same time every day. You can take it with or without food. If it upsets your stomach, take it with food. Keep taking it unless your care team tells you to stop. Talk to your care team about the use of this medication in children. Special care may be needed. Overdosage: If you think you have taken too much of this medicine contact a poison control center or emergency room at once. NOTE: This medicine is only for you. Do not share this medicine with others. What if I miss a dose? If you miss a dose, take it as soon as you can. If it is almost time for your next dose, take only that dose. Do not take double or extra doses. What may interact with this medication? Carbamazepine Certain medications for fungal infections, such as itraconazole, ketoconazole Clarithromycin Cyclosporine Efavirenz Etravirine Phenytoin Rifampin St. John's wort This list may not describe all possible interactions. Give your health care provider a list of all the medicines, herbs, non-prescription drugs, or dietary supplements you use. Also tell them if you smoke, drink alcohol, or use illegal drugs. Some items may interact with your medicine. What should I watch  for while using this medication? Visit your care team for regular checks on your progress. Tell your care team if your symptoms do not start to get better or if they get worse. What side effects may I notice from receiving this medication? Side effects that you should report to your care team as soon as possible: Allergic reactions--skin rash, itching, hives, swelling of the face, lips, tongue, or throat Side effects that usually do not require medical attention (report to your care team if they continue or are bothersome): Constipation Fatigue Loss of appetite with weight loss Nausea This list may not describe all possible side effects. Call your doctor for medical advice about side effects. You may report side effects to FDA at 1-800-FDA-1088. Where should I keep my medication? Keep out of the reach of children and pets. Store at room temperature between 20 and 25 degrees C (68 and 77 degrees F). Get rid of any unused medication after the expiration date. To get rid of medications that are no longer needed or have expired: Take the medication to a medication take-back program. Check with your pharmacy or law enforcement to find a location. If you cannot return the medication, check the label or package insert to see if the medication should be thrown out in the garbage or flushed down the toilet. If you are not sure, ask your care team. If it is safe to put it in the trash, take the medication out of the container. Mix the medication with cat litter, dirt, coffee grounds, or other unwanted substance. Seal the mixture in a bag or container. Put it  in the trash. NOTE: This sheet is a summary. It may not cover all possible information. If you have questions about this medicine, talk to your doctor, pharmacist, or health care provider.  2023 Elsevier/Gold Standard (2020-06-04 00:00:00) Rimegepant Disintegrating Tablets What is this medication? RIMEGEPANT (ri ME je pant) prevents and treats  migraines. It works by blocking a substance in the body that causes migraines. This medicine may be used for other purposes; ask your health care provider or pharmacist if you have questions. COMMON BRAND NAME(S): NURTEC ODT What should I tell my care team before I take this medication? They need to know if you have any of these conditions: Kidney disease Liver disease An unusual or allergic reaction to rimegepant, other medications, foods, dyes, or preservatives Pregnant or trying to get pregnant Breast-feeding How should I use this medication? Take this medication by mouth. Take it as directed on the prescription label. Leave the tablet in the sealed pack until you are ready to take it. With dry hands, open the pack and gently remove the tablet. If the tablet breaks or crumbles, throw it away. Use a new tablet. Place the tablet in the mouth and allow it to dissolve. Then, swallow it. Do not cut, crush, or chew this medication. You do not need water to take this medication. Talk to your care team about the use of this medication in children. Special care may be needed. Overdosage: If you think you have taken too much of this medicine contact a poison control center or emergency room at once. NOTE: This medicine is only for you. Do not share this medicine with others. What if I miss a dose? This does not apply. This medication is not for regular use. What may interact with this medication? Certain medications for fungal infections, such as fluconazole, itraconazole Rifampin This list may not describe all possible interactions. Give your health care provider a list of all the medicines, herbs, non-prescription drugs, or dietary supplements you use. Also tell them if you smoke, drink alcohol, or use illegal drugs. Some items may interact with your medicine. What should I watch for while using this medication? Visit your care team for regular checks on your progress. Tell your care team if your  symptoms do not start to get better or if they get worse. What side effects may I notice from receiving this medication? Side effects that you should report to your care team as soon as possible: Allergic reactions--skin rash, itching, hives, swelling of the face, lips, tongue, or throat Side effects that usually do not require medical attention (report to your care team if they continue or are bothersome): Nausea Stomach pain This list may not describe all possible side effects. Call your doctor for medical advice about side effects. You may report side effects to FDA at 1-800-FDA-1088. Where should I keep my medication? Keep out of the reach of children and pets. Store at room temperature between 20 and 25 degrees C (68 and 77 degrees F). Get rid of any unused medication after the expiration date. To get rid of medications that are no longer needed or have expired: Take the medication to a medication take-back program. Check with your pharmacy or law enforcement to find a location. If you cannot return the medication, check the label or package insert to see if the medication should be thrown out in the garbage or flushed down the toilet. If you are not sure, ask your care team. If it is safe to put  it in the trash, take the medication out of the container. Mix the medication with cat litter, dirt, coffee grounds, or other unwanted substance. Seal the mixture in a bag or container. Put it in the trash. NOTE: This sheet is a summary. It may not cover all possible information. If you have questions about this medicine, talk to your doctor, pharmacist, or health care provider.  2023 Elsevier/Gold Standard (2021-09-05 00:00:00)

## 2022-10-06 NOTE — Progress Notes (Signed)
[facility]    Provider:  Dr Lucia Gaskins Requesting Provider: Rodrigo Ran, MD Primary Care Provider:  Rodrigo Ran, MD  CC:  migraines  Virtual Visit via Video Note  I connected with Lori Marshall on 10/06/22 at  2:30 PM EST by a video enabled telemedicine application and verified that I am speaking with the correct person using two identifiers.  Location: Patient: home Provider: office   I discussed the limitations of evaluation and management by telemedicine and the availability of in person appointments. The patient expressed understanding and agreed to proceed.  Follow Up Instructions:    I discussed the assessment and treatment plan with the patient. The patient was provided an opportunity to ask questions and all were answered. The patient agreed with the plan and demonstrated an understanding of the instructions.   The patient was advised to call back or seek an in-person evaluation if the symptoms worsen or if the condition fails to improve as anticipated.  I provided 30 minutes of non-face-to-face time during this encounter.   Lori Fret, MD   2/5/2024[name] [name] and rizatriptan did not help. Worsening migraines. She is having moderate to severe 20/30 migraines a month 12-24 hours each.  and daily headaches for > 6 months. Failed multiple medications. No aura. No med overuse. Will get botox for migraines approved. Tried and failed multiple other meds, chronic migraines, no medication overuse, no aura.  Patient complains of symptoms per HPI as well as the following symptoms: migraines . Pertinent negatives and positives per HPI. All others negative   HPI:  Lori Marshall is a 46 y.o. female here as requested by Lori Ran, MD for migraines. PMHx HLD, HTN, overweight, anxiety, impaired glucose, insomnia, hpercalcemia, manic episode, mood disorder, constipation, abnormal LFTs, chronic pain, headache. Started in her 26s.  Pulsating/pounding/throbbing, can be unilateral , photo/phonophobia, nausea, started getting worse in her first trimester of prgnancy but her 2nd-3rd trimester was fine but since then about 3+ years her migraines have worsened and they can be severe and can vomit. She has a toddler and noise can really trigger, laying down helps but she has vomiting, severe. She had botox in the past which helped. She has some floaters but doesn't appear to be an aura. Can start in the forehead and behind the eyes. Flexeril helps. Radiate to the back of the head. Has shoulder tightness. She can't sleep during a migraine, she works herself up. They have a teenager and a toddler, they are not having anymore children but she can;t use bith control. Over the last year, usually start in the afternoon and can last overnight so > 12 hours, worse with period. 6 migraine days a month and <14 total headache days a month.   Reviewed notes, labs and imaging from outside physicians, which showed:  From a thorough review of records, medications tried that can be used in migraine management include: flexeril,phenergan,melatonin,benadryl, tylenol, asa, atenolol, baclofen,fioricet,stadol,carbatrol,celexa,decadron, ibuprofen,ketorolac,labetalol, mag,robaxin,mobic,meclizine,reglan,naproxen, zofran,compazine,zoloft,nortriptyline,sumatriptan(gave her palpitations and anxiety),rizatriptan,valproic acid, aimovig contraindicated due to HTN and constipation, topamax, ajovy  CT head 11/18/2019: CLINICAL DATA:  Right eye loss of visual field. Headache. Dizziness. Thirty-six weeks pregnant.   EXAM: CT HEAD WITHOUT CONTRAST   TECHNIQUE: Contiguous axial images were obtained from the base of the skull through the vertex without intravenous contrast.   COMPARISON:  April 28, 2013   FINDINGS: Brain: Pituitary gland measuring 12 x 10 x 8 mm, within normal limits during pregnancy and lactation. No acute intracranial hemorrhage, loss of  gray-white matter junction differentiation, midline shift, mass effect or abnormal extra-axial fluid collection. Normal intracranial ventricular system.   Vascular: No hyperdense vessel or unexpected calcification.   Skull: Normal.   Sinuses/Orbits: Minimal bilateral ethmoidal mucosal thickening. Normal bilateral orbital soft tissues.   Other: A pneumatized bilateral middle ears and mastoid air cells.   IMPRESSION: Normal unenhanced CT brain examination.  Review of Systems: Patient complains of symptoms per HPI as well as the following symptoms anxiety and panic attacks. Pertinent negatives and positives per HPI. All others negative.   Social History   Socioeconomic History   Marital status: Married    Spouse name: Lori Marshall   Number of children: 2   Years of education: Not on file   Highest education level: Not on file  Occupational History   Occupation: Animator: Scientific laboratory technician   Occupation: Stay at home mom  Tobacco Use   Smoking status: Former    Packs/day: 0.50    Years: 5.00    Total pack years: 2.50    Types: Cigarettes    Quit date: 2020    Years since quitting: 4.0   Smokeless tobacco: Never  Vaping Use   Vaping Use: Never used  Substance and Sexual Activity   Alcohol use: Yes    Comment: socially; last drank champagne feb 2021   Drug use: No   Sexual activity: Not Currently    Birth control/protection: None  Other Topics Concern   Not on file  Social History Narrative   ** Merged History Encounter **       Patient contact person: Lori Marshall  [phone]   Social Determinants of Health   Financial Resource Strain: Not on file  Food Insecurity: Not on file  Transportation Needs: Not on file  Physical Activity: Not on file  Stress: Not on file  Social Connections: Not on file  Intimate Partner Violence: Not on file    Family History  Problem Relation Age of Onset   Migraines Mother    Diabetes Father    Melanoma  Father    Colon polyps Father    Colon cancer Neg Hx    Esophageal cancer Neg Hx    Rectal cancer Neg Hx    Stomach cancer Neg Hx     Past Medical History:  Diagnosis Date   Anxiety    panic attacks   C. difficile diarrhea 2014   Depression    past hx anxiety, not depression   Diarrhea    Esophageal reflux    Gestational diabetes    Hypothyroid    in her 20's x 1 yr synthroid only   Irritable bowel syndrome    Migraine, unspecified, without mention of intractable migraine without mention of status migrainosus    Panic disorder without agoraphobia    Vomiting alone     Patient Active Problem List   Diagnosis Date Noted   Hypothyroid 08/04/2022   Chronic migraine without aura without status migrainosus, not intractable 06/05/2022   Pregnancy 11/23/2019   Visual disturbance of one eye 11/18/2019   Headache in pregnancy, third trimester 11/18/2019   Pregnant and not yet delivered in third trimester 11/18/2019   Abnormal glucose tolerance test (GTT) during pregnancy, antepartum 09/24/2019   Alcohol abuse 08/12/2013   Depression 08/11/2013   Clostridium difficile colitis 08/09/2013   Anxiety    GERD 05/17/2010   Irritable bowel syndrome 05/17/2010    Past Surgical History:  Procedure Laterality Date  APPENDECTOMY     CHOLECYSTECTOMY  08/19/2011   Procedure: LAPAROSCOPIC CHOLECYSTECTOMY WITH INTRAOPERATIVE CHOLANGIOGRAM;  Surgeon: Earnstine Regal, MD;  Location: [facility];  Service: General;  Laterality: N/A;   COLONOSCOPY  2011   DB   WISDOM TOOTH EXTRACTION      Current Outpatient Medications  Medication Sig Dispense Refill   acyclovir (ZOVIRAX) 800 MG tablet      ALPRAZolam (XANAX) 0.5 MG tablet Take 0.5 mg by mouth daily as needed.     Chlorphen-Pseudoeph-Methscop (ALLERGY DN PO)      cyclobenzaprine (FLEXERIL) 10 MG tablet 1 tablet as needed up to 3x a day for muscle spasms or muscle tension 60 tablet 6   diphenhydrAMINE HCl (BENADRYL ALLERGY PO)      famotidine  (PEPCID) 20 MG tablet 1 tablet at bedtime as needed     fluconazole (DIFLUCAN) 150 MG tablet Take 1 tablet PO once. Repeat in 3 days if needed. 2 tablet 0   hydrochlorothiazide (HYDRODIURIL) 12.5 MG tablet Take 25 mg by mouth daily.     ibuprofen (ADVIL) 600 MG tablet Take 1 tablet (600 mg total) by mouth every 6 (six) hours as needed. 30 tablet 0   MELATONIN PO      nystatin cream (MYCOSTATIN) Apply 1 Application topically 2 (two) times daily. 30 g 0   Omega-3 Fatty Acids (FISH OIL PO) Take by mouth 2 (two) times daily. 4 tablets 2 morning,2 night     Oxymetazoline HCl (RHOFADE) 1 % CREA Rhofade 1 % topical cream  EVENLY APPLY A PEA SIZE AMOUNT TO THE FACE EVERY MORNING. WASH HANDS AFTER USE     promethazine (PHENERGAN) 25 MG tablet Take 1 tablet (25 mg total) by mouth every 8 (eight) hours as needed for nausea or vomiting. 30 tablet 11   propranolol (INDERAL) 10 MG tablet Take 10 mg by mouth daily.     REPATHA SURECLICK 073 MG/ML SOAJ [id number]:854 Milligram(s) SUB-Q Every 2 Weeks     rizatriptan (MAXALT-MLT) 10 MG disintegrating tablet Take 1 tablet (10 mg total) by mouth as needed for migraine. May repeat in 2 hours if needed 9 tablet 11   Semaglutide, 1 MG/DOSE, (OZEMPIC, 1 MG/DOSE,) 2 MG/1.5ML SOPN      triamcinolone cream (KENALOG) 0.1 % Apply 1 application. topically 2 (two) times daily. 453.6 g 0   Ubrogepant (UBRELVY) 100 MG TABS Take 100 mg by mouth every 2 (two) hours as needed. Maximum 200mg  a day. 16 tablet 11   UNABLE TO FIND Essential oils     valACYclovir (VALTREX) 1000 MG tablet take 2 tablets at the first sign of fever blister. 12 hours later take 2 more , then stop     No current facility-administered medications for this visit.    Allergies as of 10/06/2022 - Review Complete 10/06/2022  Allergen Reaction Noted   Dust mite extract Swelling 12/30/2010   Medrol [methylprednisolone] Nausea And Vomiting 08/06/2013   Sertraline hcl Other (See Comments) 10/11/2021   Baclofen  Anxiety and Palpitations 09/22/2018   Novocain [procaine] Palpitations 08/06/2013    Vitals: There were no vitals taken for this visit. Last Weight:  Wt Readings from Last 1 Encounters:  08/27/22 168 lb (76.2 kg)   Last Height:   Ht Readings from Last 1 Encounters:  08/27/22 5\' 6"  (1.676 m)     Physical exam: Exam: Gen: NAD, conversant, well nourised, well groomed  CV: RRR, no MRG. No Carotid Bruits. No peripheral edema, warm, nontender Eyes: Conjunctivae clear without exudates or hemorrhage  Neuro: Detailed Neurologic Exam  Speech:    Speech is normal; fluent and spontaneous with normal comprehension.  Cognition:    The patient is oriented to person, place, and time;     recent and remote memory intact;     language fluent;     normal attention, concentration,     fund of knowledge Cranial Nerves:    The pupils are equal, round, and reactive to light. The fundi are normal and spontaneous venous pulsations are present. Visual fields are full to finger confrontation. Extraocular movements are intact. Trigeminal sensation is intact and the muscles of mastication are normal. The face is symmetric. The palate elevates in the midline. Hearing intact. Voice is normal. Shoulder shrug is normal. The tongue has normal motion without fasciculations.   Coordination:    Normal .   Gait:     normal.   Motor Observation:    No asymmetry, no atrophy, and no involuntary movements noted. Tone:    Normal muscle tone.    Posture:    Posture is normal. normal erect    Strength:    Strength is V/V in the upper and lower limbs.      Sensation: intact to LT     Reflex Exam:  DTR's:    Deep tendon reflexes in the upper and lower extremities are normal bilaterally.   Toes:    The toes are downgoing bilaterally.   Clonus:    Clonus is absent.    Assessment/Plan:  Patient with migraines, had a long discussion about options and migraine management; Ubrelvy and  rizatriptan did not help. Worsening migraines. She is having moderate to severe 20/30 migraines a month 12-24 hours each.  and daily headaches for > 6 months. Failed multiple medications. No aura. No med overuse. Will get botox for migraines approved. Tried and failed multiple other meds, chronic migraines, no medication overuse, no aura.  Newer medications: Jonni Sanger, [place], [place], [place], Ajovy, botox  Flexeril for neck spasms  Botox for migraines - start approval process  From a thorough review of records, medications tried that can be used in migraine management include: flexeril,phenergan,melatonin,benadryl, tylenol, asa, atenolol, baclofen,fioricet,stadol,carbatrol,celexa,decadron, ibuprofen,ketorolac,labetalol, mag,robaxin,mobic,meclizine,reglan,naproxen, zofran,compazine,zoloft,nortriptyline,sumatriptan(gave her palpitations and anxiety),rizatriptan,valproic acid, aimovig contraindicated due to HTN and constipation, topamax, ajovy, iubrelvy, rizatriptan  No orders of the defined types were placed in this encounter.   Cc: Crist Infante, MD,  Crist Infante, MD  Sarina Ill, MD  [facility] [address] [place], [place] [address]  Phone [phone] Fax [phone]

## 2022-10-06 NOTE — Telephone Encounter (Signed)
NEW BOTOX AUTH:  Chronic Migraine CPT 6461  Botox J0585 Units:155  G43.709 Chronic Migraine without aura, not intractable, without status migrainous  Once approved needs appt with NP or Dr. Jaynee Eagles whomever available soonest.

## 2022-10-06 NOTE — Telephone Encounter (Signed)
Appt time delayed by 15 min.

## 2022-10-07 ENCOUNTER — Telehealth: Payer: Self-pay | Admitting: *Deleted

## 2022-10-07 DIAGNOSIS — F411 Generalized anxiety disorder: Secondary | ICD-10-CM | POA: Diagnosis not present

## 2022-10-07 DIAGNOSIS — F410 Panic disorder [episodic paroxysmal anxiety] without agoraphobia: Secondary | ICD-10-CM | POA: Diagnosis not present

## 2022-10-07 NOTE — Telephone Encounter (Signed)
-----   Message from Melvenia Beam, MD sent at 10/06/2022  5:41 PM EST ----- Regarding: botox for migraines Start approval process for botpox for migraine she can be placed with NP. G43.709.

## 2022-10-07 NOTE — Telephone Encounter (Signed)
Chronic Migraine CPT 64615  Botox J0585 Units:200  G43.709 Chronic Migraine without aura, not intractable, without status migrainous   Please let us know if buy and bill or SP so we can get patient scheduled accordingly

## 2022-10-08 ENCOUNTER — Other Ambulatory Visit (HOSPITAL_COMMUNITY): Payer: Self-pay

## 2022-10-08 NOTE — Telephone Encounter (Signed)
Duplicate request. Closing this one- updates will be in the other one. Thanks!

## 2022-10-08 NOTE — Telephone Encounter (Signed)
  Benefit Verification [id number] Submitted!

## 2022-10-08 NOTE — Telephone Encounter (Signed)
Pharmacy Patient Advocate Encounter   Received notification from [facility] that prior authorization for Botox 200 Units is required/requested.    PA submitted on 10/08/2022 to (ins) [employer] [place] [employer] via [employer] Status is pending

## 2022-10-09 ENCOUNTER — Telehealth: Payer: BC Managed Care – PPO | Admitting: Neurology

## 2022-10-09 ENCOUNTER — Telehealth: Payer: Self-pay | Admitting: Plastic Surgery

## 2022-10-09 NOTE — Telephone Encounter (Signed)
Received call from [facility] requesting documentation if pt has tried speciality bras for relief.  Unable to locate documentation.

## 2022-10-14 ENCOUNTER — Other Ambulatory Visit (HOSPITAL_COMMUNITY): Payer: Self-pay

## 2022-10-14 MED ORDER — ONABOTULINUMTOXINA 200 UNITS IJ SOLR: INTRAMUSCULAR | 3 refills | Status: DC

## 2022-10-14 NOTE — Addendum Note (Signed)
Addended by: Brandon Melnick on: 10/14/2022 04:03 PM   Modules accepted: Orders

## 2022-10-14 NOTE — Telephone Encounter (Signed)
Pharmacy Patient Advocate Encounter  Prior Authorization for Botox 200UNIT solution has been approved.    PA# Not provided Effective dates: 10/08/2022 through 09/08/2023  This will need to be filled with [facility] [phone]

## 2022-10-14 NOTE — Telephone Encounter (Signed)
Phone rep called pt and left a brief message asking pt to call and schedule an appointment, office # left.

## 2022-10-23 DIAGNOSIS — E663 Overweight: Secondary | ICD-10-CM | POA: Diagnosis not present

## 2022-10-23 DIAGNOSIS — R030 Elevated blood-pressure reading, without diagnosis of hypertension: Secondary | ICD-10-CM | POA: Diagnosis not present

## 2022-10-23 DIAGNOSIS — R7301 Impaired fasting glucose: Secondary | ICD-10-CM | POA: Diagnosis not present

## 2022-10-23 DIAGNOSIS — E785 Hyperlipidemia, unspecified: Secondary | ICD-10-CM | POA: Diagnosis not present

## 2022-10-23 DIAGNOSIS — F419 Anxiety disorder, unspecified: Secondary | ICD-10-CM | POA: Diagnosis not present

## 2022-10-23 DIAGNOSIS — Z1212 Encounter for screening for malignant neoplasm of rectum: Secondary | ICD-10-CM | POA: Diagnosis not present

## 2022-10-23 DIAGNOSIS — R946 Abnormal results of thyroid function studies: Secondary | ICD-10-CM | POA: Diagnosis not present

## 2022-10-23 DIAGNOSIS — I10 Essential (primary) hypertension: Secondary | ICD-10-CM | POA: Diagnosis not present

## 2022-10-23 DIAGNOSIS — H6001 Abscess of right external ear: Secondary | ICD-10-CM | POA: Diagnosis not present

## 2022-10-23 NOTE — Telephone Encounter (Signed)
We received a fax from [facility] that Botox will be delivered 10/30/22. I called pt and got her scheduled for 11/11/22 at 10:15 am with Judson Roch.

## 2022-10-30 DIAGNOSIS — R82998 Other abnormal findings in urine: Secondary | ICD-10-CM | POA: Diagnosis not present

## 2022-10-30 DIAGNOSIS — F39 Unspecified mood [affective] disorder: Secondary | ICD-10-CM | POA: Diagnosis not present

## 2022-10-30 DIAGNOSIS — Z0000 Encounter for general adult medical examination without abnormal findings: Secondary | ICD-10-CM | POA: Diagnosis not present

## 2022-10-30 DIAGNOSIS — R7301 Impaired fasting glucose: Secondary | ICD-10-CM | POA: Diagnosis not present

## 2022-10-30 DIAGNOSIS — I10 Essential (primary) hypertension: Secondary | ICD-10-CM | POA: Diagnosis not present

## 2022-10-30 DIAGNOSIS — Z1331 Encounter for screening for depression: Secondary | ICD-10-CM | POA: Diagnosis not present

## 2022-10-30 DIAGNOSIS — E785 Hyperlipidemia, unspecified: Secondary | ICD-10-CM | POA: Diagnosis not present

## 2022-11-10 DIAGNOSIS — G43709 Chronic migraine without aura, not intractable, without status migrainosus: Secondary | ICD-10-CM | POA: Diagnosis not present

## 2022-11-10 NOTE — Telephone Encounter (Signed)
Called [facility] back, rep stated she was not sure why pt got this notification and that Botox was still on track TBD 11/11/22. Called pt back and informed her.

## 2022-11-10 NOTE — Telephone Encounter (Signed)
Pt called and stated she received a text from [facility] stating her botox will be delayed. Pt wants to know if she needs to keep appointment for tomorrow.

## 2022-11-11 ENCOUNTER — Ambulatory Visit (INDEPENDENT_AMBULATORY_CARE_PROVIDER_SITE_OTHER): Payer: BC Managed Care – PPO | Admitting: Neurology

## 2022-11-11 ENCOUNTER — Encounter: Payer: Self-pay | Admitting: Neurology

## 2022-11-11 VITALS — BP 119/85 | HR 76 | Ht 66.0 in | Wt 168.0 lb

## 2022-11-11 DIAGNOSIS — G43709 Chronic migraine without aura, not intractable, without status migrainosus: Secondary | ICD-10-CM

## 2022-11-11 MED ORDER — ONABOTULINUMTOXINA 200 UNITS IJ SOLR
155.0000 [IU] | INTRAMUSCULAR | Status: AC
  Administered 2022-11-11: 155 [IU] via INTRAMUSCULAR

## 2022-11-11 NOTE — Progress Notes (Signed)
   BOTOX PROCEDURE NOTE FOR MIGRAINE HEADACHE  HISTORY: Lori Marshall is here for her first Botox injection. Has been on Botox years ago, had great benefit, tried for a year. Her migraines returned after her last baby who is 2. Takes Flexeril and Phenergan for acute migraine relief. Having 15 migraines a month. Melburn Hake and Maxalt, they didn't work.   Description of procedure:  The patient was placed in a sitting position. The standard protocol was used for Botox as follows, with 5 units of Botox injected at each site:  -Procerus muscle, midline injection  -Corrugator muscle, bilateral injection  -Frontalis muscle, bilateral injection, with 2 sites each side, medial injection was performed in the upper one third of the frontalis muscle, in the region vertical from the medial inferior edge of the superior orbital rim. The lateral injection was again in the upper one third of the forehead vertically above the lateral limbus of the cornea, 1.5 cm lateral to the medial injection site.  -Temporalis muscle injection, 4 sites, bilaterally. The first injection was 3 cm above the tragus of the ear, second injection site was 1.5 cm to 3 cm up from the first injection site in line with the tragus of the ear. The third injection site was 1.5-3 cm forward between the first 2 injection sites. The fourth injection site was 1.5 cm posterior to the second injection site.  -Occipitalis muscle injection, 3 sites, bilaterally. The first injection was done one half way between the occipital protuberance and the tip of the mastoid process behind the ear. The second injection site was done lateral and superior to the first, 1 fingerbreadth from the first injection. The third injection site was 1 fingerbreadth superiorly and medially from the first injection site.  -Cervical paraspinal muscle injection, 2 sites, bilateral, the first injection site was 1 cm from the midline of the cervical spine, 3 cm inferior to the  lower border of the occipital protuberance. The second injection site was 1.5 cm superiorly and laterally to the first injection site.  -Trapezius muscle injection was performed at 3 sites, bilaterally. The first injection site was in the upper trapezius muscle halfway between the inflection point of the neck, and the acromion. The second injection site was one half way between the acromion and the first injection site. The third injection was done between the first injection site and the inflection point of the neck.  A 200 unit bottle of Botox was used, 155 units were injected, the rest of the Botox was wasted. The patient tolerated the procedure well, there were no complications of the above procedure.  Botox NDC [phone] Lot number [id number] Expiration date 10/2024 SP  I gave her  # 1 Nurtec box [id number] 3/26 to try as sample if she likes it I will send in.

## 2022-11-11 NOTE — Progress Notes (Signed)
Botox 200 units x 1 vial  LOT #: [id number] EXP: 2026/03 NDC: [id number]  SP  Witnessed by Hal Hope

## 2022-11-17 DIAGNOSIS — Z1211 Encounter for screening for malignant neoplasm of colon: Secondary | ICD-10-CM | POA: Diagnosis not present

## 2022-12-31 ENCOUNTER — Telehealth: Payer: Self-pay | Admitting: Plastic Surgery

## 2022-12-31 NOTE — Telephone Encounter (Signed)
Prior Auth extension for Ref# [id number] good from 6.1.24 to 8.30.24.

## 2023-01-22 DIAGNOSIS — D2262 Melanocytic nevi of left upper limb, including shoulder: Secondary | ICD-10-CM | POA: Diagnosis not present

## 2023-01-22 DIAGNOSIS — D2261 Melanocytic nevi of right upper limb, including shoulder: Secondary | ICD-10-CM | POA: Diagnosis not present

## 2023-01-22 DIAGNOSIS — L718 Other rosacea: Secondary | ICD-10-CM | POA: Diagnosis not present

## 2023-01-22 DIAGNOSIS — D2272 Melanocytic nevi of left lower limb, including hip: Secondary | ICD-10-CM | POA: Diagnosis not present

## 2023-01-28 ENCOUNTER — Telehealth: Payer: Self-pay | Admitting: Neurology

## 2023-01-28 NOTE — Telephone Encounter (Signed)
Initiated auth for continuation of Botox to ensure coverage, prior notes did not contain approval #s. Faxed form to [employer] at [phone].

## 2023-02-03 ENCOUNTER — Ambulatory Visit: Payer: BC Managed Care – PPO | Admitting: Neurology

## 2023-02-09 NOTE — Telephone Encounter (Signed)
Approval ref from [employer]: [id number] (10/08/22-03/24/23).

## 2023-02-16 DIAGNOSIS — G43709 Chronic migraine without aura, not intractable, without status migrainosus: Secondary | ICD-10-CM | POA: Diagnosis not present

## 2023-02-19 ENCOUNTER — Telehealth: Payer: Self-pay | Admitting: *Deleted

## 2023-02-19 NOTE — Telephone Encounter (Signed)
LVM to schedule surgery

## 2023-02-25 ENCOUNTER — Telehealth: Payer: Self-pay | Admitting: Plastic Surgery

## 2023-02-25 ENCOUNTER — Ambulatory Visit: Payer: BC Managed Care – PPO | Admitting: Neurology

## 2023-02-25 NOTE — Telephone Encounter (Signed)
Called patiuent - she has started a new medication - doctor has recommended she wait six months before a surgery.  She will call back and follow up with surgeon

## 2023-03-19 ENCOUNTER — Ambulatory Visit (INDEPENDENT_AMBULATORY_CARE_PROVIDER_SITE_OTHER): Payer: BC Managed Care – PPO | Admitting: Neurology

## 2023-03-19 VITALS — BP 129/90

## 2023-03-19 DIAGNOSIS — G43009 Migraine without aura, not intractable, without status migrainosus: Secondary | ICD-10-CM

## 2023-03-19 DIAGNOSIS — G43709 Chronic migraine without aura, not intractable, without status migrainosus: Secondary | ICD-10-CM | POA: Diagnosis not present

## 2023-03-19 MED ORDER — ONABOTULINUMTOXINA 200 UNITS IJ SOLR
155.0000 [IU] | Freq: Once | INTRAMUSCULAR | Status: AC
  Administered 2023-03-19: 155 [IU] via INTRAMUSCULAR

## 2023-03-19 NOTE — Progress Notes (Signed)
   BOTOX PROCEDURE NOTE FOR MIGRAINE HEADACHE  HISTORY: Lori Marshall is here for her second Botox injection.  Last was 11/11/22 by me. Botox did great for her. Used Flexeril and Phenergan as needed worked quickly, only 3-4 migraines since last seen, started when overdue 1 month for Botox. Took Xanax before coming. Does not recall trying the Nurtec. Alternates between [place] and Maxalt.   Description of procedure:  The patient was placed in a sitting position. The standard protocol was used for Botox as follows, with 5 units of Botox injected at each site:  -Procerus muscle, midline injection  -Corrugator muscle, bilateral injection  -Frontalis muscle, bilateral injection, with 2 sites each side, medial injection was performed in the upper one third of the frontalis muscle, in the region vertical from the medial inferior edge of the superior orbital rim. The lateral injection was again in the upper one third of the forehead vertically above the lateral limbus of the cornea, 1.5 cm lateral to the medial injection site.  -Temporalis muscle injection, 4 sites, bilaterally. The first injection was 3 cm above the tragus of the ear, second injection site was 1.5 cm to 3 cm up from the first injection site in line with the tragus of the ear. The third injection site was 1.5-3 cm forward between the first 2 injection sites. The fourth injection site was 1.5 cm posterior to the second injection site.  -Occipitalis muscle injection, 3 sites, bilaterally. The first injection was done one half way between the occipital protuberance and the tip of the mastoid process behind the ear. The second injection site was done lateral and superior to the first, 1 fingerbreadth from the first injection. The third injection site was 1 fingerbreadth superiorly and medially from the first injection site.  -Cervical paraspinal muscle injection, 2 sites, bilateral, the first injection site was 1 cm from the midline of the cervical  spine, 3 cm inferior to the lower border of the occipital protuberance. The second injection site was 1.5 cm superiorly and laterally to the first injection site.  -Trapezius muscle injection was performed at 3 sites, bilaterally. The first injection site was in the upper trapezius muscle halfway between the inflection point of the neck, and the acromion. The second injection site was one half way between the acromion and the first injection site. The third injection was done between the first injection site and the inflection point of the neck.   A 200 unit bottle of Botox was used, 155 units were injected, the rest of the Botox was wasted. The patient tolerated the procedure well, there were no complications of the above procedure.  Botox NDC 7829-5621-30 Lot number [id number] Expiration date 08/2025 SP

## 2023-03-19 NOTE — Progress Notes (Signed)
Botox- 200 units x 1 vial Lot: [id number] Expiration: 12.2026 NDC: 0023-3921-02  Bacteriostatic 0.9% Sodium Chloride- 4 mL  Lot: [id number] Expiration: 11.01.2025 NDC: [id number]  Dx: O13.086 S/P  Witnessed by Cinda Quest, CMA

## 2023-04-14 NOTE — Telephone Encounter (Signed)
Completed renewal auth form and placed in nurse pod for NP signature.

## 2023-04-15 NOTE — Telephone Encounter (Signed)
Faxed renewal form to [facility] @ [phone].

## 2023-04-16 NOTE — Telephone Encounter (Signed)
Received approval from [facility] PA# [id number] (04/15/23-03/16/24).

## 2023-05-25 DIAGNOSIS — J209 Acute bronchitis, unspecified: Secondary | ICD-10-CM | POA: Diagnosis not present

## 2023-05-25 DIAGNOSIS — R058 Other specified cough: Secondary | ICD-10-CM | POA: Diagnosis not present

## 2023-05-25 DIAGNOSIS — R0981 Nasal congestion: Secondary | ICD-10-CM | POA: Diagnosis not present

## 2023-06-15 DIAGNOSIS — G43709 Chronic migraine without aura, not intractable, without status migrainosus: Secondary | ICD-10-CM | POA: Diagnosis not present

## 2023-06-16 ENCOUNTER — Ambulatory Visit (INDEPENDENT_AMBULATORY_CARE_PROVIDER_SITE_OTHER): Payer: BC Managed Care – PPO | Admitting: Neurology

## 2023-06-16 VITALS — BP 125/88

## 2023-06-16 DIAGNOSIS — G43709 Chronic migraine without aura, not intractable, without status migrainosus: Secondary | ICD-10-CM | POA: Diagnosis not present

## 2023-06-16 DIAGNOSIS — G43009 Migraine without aura, not intractable, without status migrainosus: Secondary | ICD-10-CM

## 2023-06-16 MED ORDER — CYCLOBENZAPRINE HCL 10 MG PO TABS: ORAL_TABLET | ORAL | 6 refills | Status: AC

## 2023-06-16 MED ORDER — PROMETHAZINE HCL 25 MG PO TABS: 25.0000 mg | ORAL_TABLET | Freq: Three times a day (TID) | ORAL | 5 refills | Status: AC | PRN

## 2023-06-16 MED ORDER — NURTEC 75 MG PO TBDP: 75.0000 mg | ORAL_TABLET | ORAL | 11 refills | Status: DC | PRN

## 2023-06-16 MED ORDER — ONABOTULINUMTOXINA 200 UNITS IJ SOLR
155.0000 [IU] | Freq: Once | INTRAMUSCULAR | Status: AC
  Administered 2023-06-16: 155 [IU] via INTRAMUSCULAR

## 2023-06-16 MED ORDER — NURTEC 75 MG PO TBDP: 1.0000 | ORAL_TABLET | Freq: Every day | ORAL | Status: DC | PRN

## 2023-06-16 MED ORDER — KETOROLAC TROMETHAMINE 60 MG/2ML IM SOLN
60.0000 mg | Freq: Once | INTRAMUSCULAR | Status: AC
  Administered 2023-06-16: 60 mg via INTRAMUSCULAR

## 2023-06-16 NOTE — Progress Notes (Signed)
Botox- 200 units x 1 vial Lot: [id number] Expiration: 10/2025 NDC: 4782-9562-13  Bacteriostatic 0.9% Sodium Chloride- 4 mL  Lot: [id number] Expiration: 04.01.2025 NDC: [id number]  Dx: E95.284 S/P Witnessed by April, RN

## 2023-06-16 NOTE — Progress Notes (Signed)
BOTOX PROCEDURE NOTE FOR MIGRAINE HEADACHE  HISTORY: Here for Botox, this is her 3rd Botox. Last was 03/19/23 by me. Did well with Botox, but has worn off the last few weeks.  She has a bad migraine today.  Has been going on for 4 days, has not slept, has a lot of anxiety.  [name], rizatriptan have not helped.  She utilizes Flexeril, Phenergan, Xanax as her rescue medication.  With Botox, may have 1 episode of migraine coming on, then take her rescue medicaitons.  Has had excellent benefit with Botox.  Description of procedure:  The patient was placed in a sitting position. The standard protocol was used for Botox as follows, with 5 units of Botox injected at each site:   -Procerus muscle, midline injection  -Corrugator muscle, bilateral injection  -Frontalis muscle, bilateral injection, with 2 sites each side, medial injection was performed in the upper one third of the frontalis muscle, in the region vertical from the medial inferior edge of the superior orbital rim. The lateral injection was again in the upper one third of the forehead vertically above the lateral limbus of the cornea, 1.5 cm lateral to the medial injection site.  -Temporalis muscle injection, 4 sites, bilaterally. The first injection was 3 cm above the tragus of the ear, second injection site was 1.5 cm to 3 cm up from the first injection site in line with the tragus of the ear. The third injection site was 1.5-3 cm forward between the first 2 injection sites. The fourth injection site was 1.5 cm posterior to the second injection site.  -Occipitalis muscle injection, 3 sites, bilaterally. The first injection was done one half way between the occipital protuberance and the tip of the mastoid process behind the ear. The second injection site was done lateral and superior to the first, 1 fingerbreadth from the first injection. The third injection site was 1 fingerbreadth superiorly and medially from the first injection  site.  -Cervical paraspinal muscle injection, 2 sites, bilateral, the first injection site was 1 cm from the midline of the cervical spine, 3 cm inferior to the lower border of the occipital protuberance. The second injection site was 1.5 cm superiorly and laterally to the first injection site.  -Trapezius muscle injection was performed at 3 sites, bilaterally. The first injection site was in the upper trapezius muscle halfway between the inflection point of the neck, and the acromion. The second injection site was one half way between the acromion and the first injection site. The third injection was done between the first injection site and the inflection point of the neck.   A 200 unit bottle of Botox was used, 155 units were injected, the rest of the Botox was wasted. The patient tolerated the procedure well, there were no complications of the above procedure.  Botox NDC 1610-9604-54 Lot number [id number] Expiration date 10/2025 SP   I ordered Nurtec to try as rescue.  I have cautioned against using Xanax as a rescue cocktail.  For acute migraine, can take Nurtec, Aleve, cyclobenzaprine, even Phenergan if severe headache with vomiting.  We gave her a 60 mg IM Toradol injection for 8/10 migraine headache.  She does very well with Botox.  If we need to add in a CGRP plus Botox we may consider Qulipta or Ajovy.  She will reach out via MyChart if adjustment is needed.  Meds ordered this encounter  Medications   botulinum toxin Type A (BOTOX) injection 155 Units    Botox-  200 units x 1 vial Lot: [id number] Expiration: 10/2025 NDC: 7829-5621-30   promethazine (PHENERGAN) 25 MG tablet    Sig: Take 1 tablet (25 mg total) by mouth every 8 (eight) hours as needed for nausea or vomiting.    Dispense:  30 tablet    Refill:  5   cyclobenzaprine (FLEXERIL) 10 MG tablet    Sig: 1 tablet as needed up to 3x a day for muscle spasms or muscle tension    Dispense:  60 tablet    Refill:  6   Rimegepant  Sulfate (NURTEC) 75 MG TBDP    Sig: Take 1 tablet (75 mg total) by mouth as needed. Take 1 tablet at onset of headache, max is 1 tablet in 24 hours.    Dispense:  8 tablet    Refill:  11   ketorolac (TORADOL) injection 60 mg   Rimegepant Sulfate (NURTEC) 75 MG TBDP    Sig: Take 1 tablet (75 mg total) by mouth daily as needed.    [id number] 5/26

## 2023-07-07 ENCOUNTER — Telehealth: Payer: Self-pay | Admitting: Pharmacy Technician

## 2023-07-07 ENCOUNTER — Other Ambulatory Visit (HOSPITAL_COMMUNITY): Payer: Self-pay

## 2023-07-07 NOTE — Telephone Encounter (Signed)
Pharmacy Patient Advocate Encounter   Received notification from CoverMyMeds that prior authorization for Nurtec 75MG  dispersible tablets is required/requested.   Insurance verification completed.   The patient is insured through [facility] .   Per test claim: PA required; PA started via CoverMyMeds. KEY [id number] . Waiting for clinical questions to populate.

## 2023-07-08 NOTE — Telephone Encounter (Signed)
Clinical questions have been submitted-awaiting determination. 

## 2023-07-09 DIAGNOSIS — E785 Hyperlipidemia, unspecified: Secondary | ICD-10-CM | POA: Diagnosis not present

## 2023-07-13 NOTE — Telephone Encounter (Signed)
Rm requesting add info-faxed completed form to [phone]

## 2023-07-14 DIAGNOSIS — I10 Essential (primary) hypertension: Secondary | ICD-10-CM | POA: Diagnosis not present

## 2023-07-14 DIAGNOSIS — J209 Acute bronchitis, unspecified: Secondary | ICD-10-CM | POA: Diagnosis not present

## 2023-07-14 DIAGNOSIS — R7301 Impaired fasting glucose: Secondary | ICD-10-CM | POA: Diagnosis not present

## 2023-07-17 ENCOUNTER — Other Ambulatory Visit (HOSPITAL_COMMUNITY): Payer: Self-pay

## 2023-07-17 NOTE — Telephone Encounter (Signed)
Pharmacy Patient Advocate Encounter  Received notification from [facility] that Prior Authorization for Nurtec 75MG  dispersible tablets has been APPROVED from 07/08/2023 to 09/30/2023. Ran test claim, Copay is $0.00. This test claim was processed through [facility]- copay amounts may vary at other pharmacies due to pharmacy/plan contracts, or as the patient moves through the different stages of their insurance plan.   PA #/Case ID/Reference #: [id number]

## 2023-07-26 ENCOUNTER — Encounter: Payer: BC Managed Care – PPO | Admitting: Nurse Practitioner

## 2023-07-26 ENCOUNTER — Telehealth: Payer: BC Managed Care – PPO | Admitting: Family Medicine

## 2023-07-26 DIAGNOSIS — J069 Acute upper respiratory infection, unspecified: Secondary | ICD-10-CM | POA: Diagnosis not present

## 2023-07-26 DIAGNOSIS — J020 Streptococcal pharyngitis: Secondary | ICD-10-CM

## 2023-07-26 MED ORDER — PROMETHAZINE-DM 6.25-15 MG/5ML PO SYRP: 5.0000 mL | ORAL_SOLUTION | Freq: Four times a day (QID) | ORAL | 0 refills | Status: AC | PRN

## 2023-07-26 MED ORDER — ALBUTEROL SULFATE HFA 108 (90 BASE) MCG/ACT IN AERS: 2.0000 | INHALATION_SPRAY | Freq: Four times a day (QID) | RESPIRATORY_TRACT | 0 refills | Status: AC | PRN

## 2023-07-26 MED ORDER — AMOXICILLIN 500 MG PO CAPS: 500.0000 mg | ORAL_CAPSULE | Freq: Two times a day (BID) | ORAL | 0 refills | Status: AC

## 2023-07-26 MED ORDER — ALBUTEROL SULFATE HFA 108 (90 BASE) MCG/ACT IN AERS: 2.0000 | INHALATION_SPRAY | Freq: Four times a day (QID) | RESPIRATORY_TRACT | 0 refills | Status: DC | PRN

## 2023-07-26 MED ORDER — IBUPROFEN 100 MG/5ML PO SUSP: 600.0000 mg | ORAL | 0 refills | Status: DC | PRN

## 2023-07-26 NOTE — Progress Notes (Signed)
Virtual Visit Consent   Lori Marshall, you are scheduled for a virtual visit with a [facility] provider today. Just as with appointments in the office, your consent must be obtained to participate. Your consent will be active for this visit and any virtual visit you may have with one of our providers in the next 365 days. If you have a MyChart account, a copy of this consent can be sent to you electronically.  As this is a virtual visit, video technology does not allow for your provider to perform a traditional examination. This may limit your provider's ability to fully assess your condition. If your provider identifies any concerns that need to be evaluated in person or the need to arrange testing (such as labs, EKG, etc.), we will make arrangements to do so. Although advances in technology are sophisticated, we cannot ensure that it will always work on either your end or our end. If the connection with a video visit is poor, the visit may have to be switched to a telephone visit. With either a video or telephone visit, we are not always able to ensure that we have a secure connection.  By engaging in this virtual visit, you consent to the provision of healthcare and authorize for your insurance to be billed (if applicable) for the services provided during this visit. Depending on your insurance coverage, you may receive a charge related to this service.  I need to obtain your verbal consent now. Are you willing to proceed with your visit today? Lori Marshall has provided verbal consent on 07/26/2023 for a virtual visit (video or telephone). Georgana Curio, FNP  Date: 07/26/2023 10:08 AM  Virtual Visit via Video Note   I, Georgana Curio, connected with  Lori Marshall  ([id number], [birth year]) on 07/26/23 at 10:15 AM EST by a video-enabled telemedicine application and verified that I am speaking with the correct person using two identifiers.  Location: Patient: Virtual  Visit Location Patient: Home Provider: Virtual Visit Location Provider: Home Office   I discussed the limitations of evaluation and management by telemedicine and the availability of in person appointments. The patient expressed understanding and agreed to proceed.    History of Present Illness: Lori Marshall is a 47 y.o. who identifies as a female who was assigned female at birth, and is being seen today for cough, dry with scratchy throat and raspy voice, fever yesterday. Sx started yesterday. She reports wheezing but is not able to take prednisone due to anxiety. [name]  HPI: HPI  Problems:  Patient Active Problem List   Diagnosis Date Noted   Hypothyroid 08/04/2022   Chronic migraine without aura without status migrainosus, not intractable 06/05/2022   Pregnancy 11/23/2019   Visual disturbance of one eye 11/18/2019   Headache in pregnancy, third trimester 11/18/2019   Pregnant and not yet delivered in third trimester 11/18/2019   Abnormal glucose tolerance test (GTT) during pregnancy, antepartum 09/24/2019   Alcohol abuse 08/12/2013   Depression 08/11/2013   Clostridium difficile colitis 08/09/2013   Anxiety    GERD 05/17/2010   Irritable bowel syndrome 05/17/2010    Allergies:  Allergies  Allergen Reactions   Dust Mite Extract Swelling   Medrol [Methylprednisolone] Nausea And Vomiting   Sertraline Hcl Other (See Comments)   Baclofen Anxiety and Palpitations   Novocain [Procaine] Palpitations   Medications:  Current Outpatient Medications:    promethazine-dextromethorphan (PROMETHAZINE-DM) 6.25-15 MG/5ML syrup, Take 5 mLs by mouth 4 (four) times daily as needed  for up to 10 days for cough., Disp: 118 mL, Rfl: 0   acyclovir (ZOVIRAX) 800 MG tablet, , Disp: , Rfl:    albuterol (VENTOLIN HFA) 108 (90 Base) MCG/ACT inhaler, Inhale 2 puffs into the lungs every 6 (six) hours as needed for wheezing or shortness of breath., Disp: 8 g, Rfl: 0   ALPRAZolam (XANAX) 0.5 MG  tablet, Take 0.5 mg by mouth daily as needed., Disp: , Rfl:    botulinum toxin Type A (BOTOX) 200 units injection, Inject 155 units into head and neck muscles.  Discard remainder., Disp: 1 each, Rfl: 3   Chlorphen-Pseudoeph-Methscop (ALLERGY DN PO), , Disp: , Rfl:    cyclobenzaprine (FLEXERIL) 10 MG tablet, 1 tablet as needed up to 3x a day for muscle spasms or muscle tension, Disp: 60 tablet, Rfl: 6   diphenhydrAMINE HCl (BENADRYL ALLERGY PO), , Disp: , Rfl:    famotidine (PEPCID) 20 MG tablet, 1 tablet at bedtime as needed, Disp: , Rfl:    fluconazole (DIFLUCAN) 150 MG tablet, Take 1 tablet PO once. Repeat in 3 days if needed., Disp: 2 tablet, Rfl: 0   hydrochlorothiazide (HYDRODIURIL) 12.5 MG tablet, Take 25 mg by mouth daily., Disp: , Rfl:    ibuprofen (ADVIL) 600 MG tablet, Take 1 tablet (600 mg total) by mouth every 6 (six) hours as needed., Disp: 30 tablet, Rfl: 0   MELATONIN PO, , Disp: , Rfl:    nystatin cream (MYCOSTATIN), Apply 1 Application topically 2 (two) times daily., Disp: 30 g, Rfl: 0   Omega-3 Fatty Acids (FISH OIL PO), Take by mouth 2 (two) times daily. 4 tablets 2 morning,2 night, Disp: , Rfl:    Oxymetazoline HCl (RHOFADE) 1 % CREA, Rhofade 1 % topical cream  EVENLY APPLY A PEA SIZE AMOUNT TO THE FACE EVERY MORNING. WASH HANDS AFTER USE, Disp: , Rfl:    promethazine (PHENERGAN) 25 MG tablet, Take 1 tablet (25 mg total) by mouth every 8 (eight) hours as needed for nausea or vomiting., Disp: 30 tablet, Rfl: 5   propranolol (INDERAL) 10 MG tablet, Take 10 mg by mouth daily., Disp: , Rfl:    REPATHA SURECLICK 140 MG/ML SOAJ, SMARTSIG:140 Milligram(s) SUB-Q Every 2 Weeks, Disp: , Rfl:    Rimegepant Sulfate (NURTEC) 75 MG TBDP, Take 1 tablet (75 mg total) by mouth as needed. Take 1 tablet at onset of headache, max is 1 tablet in 24 hours., Disp: 8 tablet, Rfl: 11   Rimegepant Sulfate (NURTEC) 75 MG TBDP, Take 1 tablet (75 mg total) by mouth daily as needed., Disp: , Rfl:     rizatriptan (MAXALT-MLT) 10 MG disintegrating tablet, Take 1 tablet (10 mg total) by mouth as needed for migraine. May repeat in 2 hours if needed, Disp: 9 tablet, Rfl: 11   Semaglutide, 1 MG/DOSE, (OZEMPIC, 1 MG/DOSE,) 2 MG/1.5ML SOPN, , Disp: , Rfl:    triamcinolone cream (KENALOG) 0.1 %, Apply 1 application. topically 2 (two) times daily., Disp: 453.6 g, Rfl: 0   UNABLE TO FIND, Essential oils, Disp: , Rfl:    valACYclovir (VALTREX) 1000 MG tablet, take 2 tablets at the first sign of fever blister. 12 hours later take 2 more , then stop, Disp: , Rfl:   Current Facility-Administered Medications:    botulinum toxin Type A (BOTOX) injection 155 Units, 155 Units, Intramuscular, Q90 days, Glean Salvo, NP, 155 Units at 11/11/22 1114  Observations/Objective: Patient is well-developed, well-nourished in no acute distress.  Resting comfortably  at home.  Head is normocephalic, atraumatic.  No labored breathing.  Speech is clear and coherent with logical content.  Patient is alert and oriented at baseline.    Assessment and Plan: 1. Viral URI with cough  Increase fluids, humidifier at night, tylenol, delsym during the day and prometh dm at night.   Follow Up Instructions: I discussed the assessment and treatment plan with the patient. The patient was provided an opportunity to ask questions and all were answered. The patient agreed with the plan and demonstrated an understanding of the instructions.  A copy of instructions were sent to the patient via MyChart unless otherwise noted below.     The patient was advised to call back or seek an in-person evaluation if the symptoms worsen or if the condition fails to improve as anticipated.    Georgana Curio, FNP

## 2023-07-26 NOTE — Patient Instructions (Signed)

## 2023-07-26 NOTE — Progress Notes (Signed)
Virtual Visit Consent   Lori Marshall, you are scheduled for a virtual visit with a [facility] provider today. Just as with appointments in the office, your consent must be obtained to participate. Your consent will be active for this visit and any virtual visit you may have with one of our providers in the next 365 days. If you have a MyChart account, a copy of this consent can be sent to you electronically.  As this is a virtual visit, video technology does not allow for your provider to perform a traditional examination. This may limit your provider's ability to fully assess your condition. If your provider identifies any concerns that need to be evaluated in person or the need to arrange testing (such as labs, EKG, etc.), we will make arrangements to do so. Although advances in technology are sophisticated, we cannot ensure that it will always work on either your end or our end. If the connection with a video visit is poor, the visit may have to be switched to a telephone visit. With either a video or telephone visit, we are not always able to ensure that we have a secure connection.  By engaging in this virtual visit, you consent to the provision of healthcare and authorize for your insurance to be billed (if applicable) for the services provided during this visit. Depending on your insurance coverage, you may receive a charge related to this service.  I need to obtain your verbal consent now. Are you willing to proceed with your visit today? Lori Marshall has provided verbal consent on 07/26/2023 for a virtual visit (video or telephone). Claiborne Rigg, NP  Date: 07/26/2023 4:26 PM  Virtual Visit via Video Note   I, Claiborne Rigg, connected with  Lori Marshall  ([id number], [birth year]) on 07/26/23 at  5:00 PM EST by a video-enabled telemedicine application and verified that I am speaking with the correct person using two identifiers.  Location: Patient:  Virtual Visit Location Patient: Home Provider: Virtual Visit Location Provider: Home Office   I discussed the limitations of evaluation and management by telemedicine and the availability of in person appointments. The patient expressed understanding and agreed to proceed.    History of Present Illness: Lori Marshall is a 47 y.o. who identifies as a female who was assigned female at birth, and is being seen today for strep pharyngitis.  Lori Marshall had an earlier virtual visit with Lori Curio NP at that time she was prescribed cough syrup and SABA. She now states her throat pain is worsening, fever with tmax 102.8.  (her face is flushed on exam today). She notes lymphadenopathy and congestion of both ears.  COVID negative. Denies any sick contacts.    Problems:  Patient Active Problem List   Diagnosis Date Noted   Hypothyroid 08/04/2022   Chronic migraine without aura without status migrainosus, not intractable 06/05/2022   Pregnancy 11/23/2019   Visual disturbance of one eye 11/18/2019   Headache in pregnancy, third trimester 11/18/2019   Pregnant and not yet delivered in third trimester 11/18/2019   Abnormal glucose tolerance test (GTT) during pregnancy, antepartum 09/24/2019   Alcohol abuse 08/12/2013   Depression 08/11/2013   Clostridium difficile colitis 08/09/2013   Anxiety    GERD 05/17/2010   Irritable bowel syndrome 05/17/2010    Allergies:  Allergies  Allergen Reactions   Dust Mite Extract Swelling   Medrol [Methylprednisolone] Nausea And Vomiting   Sertraline Hcl Other (See Comments)   Baclofen  Anxiety and Palpitations   Novocain [Procaine] Palpitations   Medications:  Current Outpatient Medications:    amoxicillin (AMOXIL) 500 MG capsule, Take 1 capsule (500 mg total) by mouth 2 (two) times daily for 10 days., Disp: 20 capsule, Rfl: 0   acyclovir (ZOVIRAX) 800 MG tablet, , Disp: , Rfl:    albuterol (VENTOLIN HFA) 108 (90 Base) MCG/ACT inhaler, Inhale 2  puffs into the lungs every 6 (six) hours as needed for wheezing or shortness of breath., Disp: 8 g, Rfl: 0   ALPRAZolam (XANAX) 0.5 MG tablet, Take 0.5 mg by mouth daily as needed., Disp: , Rfl:    botulinum toxin Type A (BOTOX) 200 units injection, Inject 155 units into head and neck muscles.  Discard remainder., Disp: 1 each, Rfl: 3   Chlorphen-Pseudoeph-Methscop (ALLERGY DN PO), , Disp: , Rfl:    cyclobenzaprine (FLEXERIL) 10 MG tablet, 1 tablet as needed up to 3x a day for muscle spasms or muscle tension, Disp: 60 tablet, Rfl: 6   diphenhydrAMINE HCl (BENADRYL ALLERGY PO), , Disp: , Rfl:    famotidine (PEPCID) 20 MG tablet, 1 tablet at bedtime as needed, Disp: , Rfl:    fluconazole (DIFLUCAN) 150 MG tablet, Take 1 tablet PO once. Repeat in 3 days if needed., Disp: 2 tablet, Rfl: 0   hydrochlorothiazide (HYDRODIURIL) 12.5 MG tablet, Take 25 mg by mouth daily., Disp: , Rfl:    ibuprofen (ADVIL) 600 MG tablet, Take 1 tablet (600 mg total) by mouth every 6 (six) hours as needed., Disp: 30 tablet, Rfl: 0   MELATONIN PO, , Disp: , Rfl:    nystatin cream (MYCOSTATIN), Apply 1 Application topically 2 (two) times daily., Disp: 30 g, Rfl: 0   Omega-3 Fatty Acids (FISH OIL PO), Take by mouth 2 (two) times daily. 4 tablets 2 morning,2 night, Disp: , Rfl:    Oxymetazoline HCl (RHOFADE) 1 % CREA, Rhofade 1 % topical cream  EVENLY APPLY A PEA SIZE AMOUNT TO THE FACE EVERY MORNING. WASH HANDS AFTER USE, Disp: , Rfl:    promethazine (PHENERGAN) 25 MG tablet, Take 1 tablet (25 mg total) by mouth every 8 (eight) hours as needed for nausea or vomiting., Disp: 30 tablet, Rfl: 5   promethazine-dextromethorphan (PROMETHAZINE-DM) 6.25-15 MG/5ML syrup, Take 5 mLs by mouth 4 (four) times daily as needed for up to 10 days for cough., Disp: 118 mL, Rfl: 0   propranolol (INDERAL) 10 MG tablet, Take 10 mg by mouth daily., Disp: , Rfl:    REPATHA SURECLICK 140 MG/ML SOAJ, SMARTSIG:140 Milligram(s) SUB-Q Every 2 Weeks, Disp: ,  Rfl:    Rimegepant Sulfate (NURTEC) 75 MG TBDP, Take 1 tablet (75 mg total) by mouth as needed. Take 1 tablet at onset of headache, max is 1 tablet in 24 hours., Disp: 8 tablet, Rfl: 11   Rimegepant Sulfate (NURTEC) 75 MG TBDP, Take 1 tablet (75 mg total) by mouth daily as needed., Disp: , Rfl:    rizatriptan (MAXALT-MLT) 10 MG disintegrating tablet, Take 1 tablet (10 mg total) by mouth as needed for migraine. May repeat in 2 hours if needed, Disp: 9 tablet, Rfl: 11   Semaglutide, 1 MG/DOSE, (OZEMPIC, 1 MG/DOSE,) 2 MG/1.5ML SOPN, , Disp: , Rfl:    triamcinolone cream (KENALOG) 0.1 %, Apply 1 application. topically 2 (two) times daily., Disp: 453.6 g, Rfl: 0   UNABLE TO FIND, Essential oils, Disp: , Rfl:    valACYclovir (VALTREX) 1000 MG tablet, take 2 tablets at the first sign of  fever blister. 12 hours later take 2 more , then stop, Disp: , Rfl:   Current Facility-Administered Medications:    botulinum toxin Type A (BOTOX) injection 155 Units, 155 Units, Intramuscular, Q90 days, Glean Salvo, NP, 155 Units at 11/11/22 1114  Observations/Objective: Patient is well-developed, well-nourished in no acute distress.  Resting comfortably at home.  Head is normocephalic, atraumatic.  No labored breathing.  Speech is clear and coherent with logical content.  Patient is alert and oriented at baseline.    Assessment and Plan: 1. Strep pharyngitis - amoxicillin (AMOXIL) 500 MG capsule; Take 1 capsule (500 mg total) by mouth 2 (two) times daily for 10 days.  Dispense: 20 capsule; Refill: 0   Follow Up Instructions: I discussed the assessment and treatment plan with the patient. The patient was provided an opportunity to ask questions and all were answered. The patient agreed with the plan and demonstrated an understanding of the instructions.  A copy of instructions were sent to the patient via MyChart unless otherwise noted below.    The patient was advised to call back or seek an in-person  evaluation if the symptoms worsen or if the condition fails to improve as anticipated.    Claiborne Rigg, NP

## 2023-07-26 NOTE — Patient Instructions (Signed)
Lori Marshall, thank you for joining Claiborne Rigg, NP for today's virtual visit.  While this provider is not your primary care provider (PCP), if your PCP is located in our provider database this encounter information will be shared with them immediately following your visit.   A [facility] MyChart account gives you access to today's visit and all your visits, tests, and labs performed at [facility] " click here if you don't have a [facility] MyChart account or go to [email or web address]  Consent: (Patient) Lori Marshall provided verbal consent for this virtual visit at the beginning of the encounter.  Current Medications:  Current Outpatient Medications:    amoxicillin (AMOXIL) 500 MG capsule, Take 1 capsule (500 mg total) by mouth 2 (two) times daily for 10 days., Disp: 20 capsule, Rfl: 0   acyclovir (ZOVIRAX) 800 MG tablet, , Disp: , Rfl:    albuterol (VENTOLIN HFA) 108 (90 Base) MCG/ACT inhaler, Inhale 2 puffs into the lungs every 6 (six) hours as needed for wheezing or shortness of breath., Disp: 8 g, Rfl: 0   ALPRAZolam (XANAX) 0.5 MG tablet, Take 0.5 mg by mouth daily as needed., Disp: , Rfl:    botulinum toxin Type A (BOTOX) 200 units injection, Inject 155 units into head and neck muscles.  Discard remainder., Disp: 1 each, Rfl: 3   Chlorphen-Pseudoeph-Methscop (ALLERGY DN PO), , Disp: , Rfl:    cyclobenzaprine (FLEXERIL) 10 MG tablet, 1 tablet as needed up to 3x a day for muscle spasms or muscle tension, Disp: 60 tablet, Rfl: 6   diphenhydrAMINE HCl (BENADRYL ALLERGY PO), , Disp: , Rfl:    famotidine (PEPCID) 20 MG tablet, 1 tablet at bedtime as needed, Disp: , Rfl:    fluconazole (DIFLUCAN) 150 MG tablet, Take 1 tablet PO once. Repeat in 3 days if needed., Disp: 2 tablet, Rfl: 0   hydrochlorothiazide (HYDRODIURIL) 12.5 MG tablet, Take 25 mg by mouth daily., Disp: , Rfl:    ibuprofen (ADVIL) 600 MG tablet, Take 1 tablet (600 mg total) by  mouth every 6 (six) hours as needed., Disp: 30 tablet, Rfl: 0   MELATONIN PO, , Disp: , Rfl:    nystatin cream (MYCOSTATIN), Apply 1 Application topically 2 (two) times daily., Disp: 30 g, Rfl: 0   Omega-3 Fatty Acids (FISH OIL PO), Take by mouth 2 (two) times daily. 4 tablets 2 morning,2 night, Disp: , Rfl:    Oxymetazoline HCl (RHOFADE) 1 % CREA, Rhofade 1 % topical cream  EVENLY APPLY A PEA SIZE AMOUNT TO THE FACE EVERY MORNING. WASH HANDS AFTER USE, Disp: , Rfl:    promethazine (PHENERGAN) 25 MG tablet, Take 1 tablet (25 mg total) by mouth every 8 (eight) hours as needed for nausea or vomiting., Disp: 30 tablet, Rfl: 5   promethazine-dextromethorphan (PROMETHAZINE-DM) 6.25-15 MG/5ML syrup, Take 5 mLs by mouth 4 (four) times daily as needed for up to 10 days for cough., Disp: 118 mL, Rfl: 0   propranolol (INDERAL) 10 MG tablet, Take 10 mg by mouth daily., Disp: , Rfl:    REPATHA SURECLICK 140 MG/ML SOAJ, SMARTSIG:140 Milligram(s) SUB-Q Every 2 Weeks, Disp: , Rfl:    Rimegepant Sulfate (NURTEC) 75 MG TBDP, Take 1 tablet (75 mg total) by mouth as needed. Take 1 tablet at onset of headache, max is 1 tablet in 24 hours., Disp: 8 tablet, Rfl: 11   Rimegepant Sulfate (NURTEC) 75 MG TBDP, Take 1 tablet (75 mg total) by mouth daily as needed., Disp: ,  Rfl:    rizatriptan (MAXALT-MLT) 10 MG disintegrating tablet, Take 1 tablet (10 mg total) by mouth as needed for migraine. May repeat in 2 hours if needed, Disp: 9 tablet, Rfl: 11   Semaglutide, 1 MG/DOSE, (OZEMPIC, 1 MG/DOSE,) 2 MG/1.5ML SOPN, , Disp: , Rfl:    triamcinolone cream (KENALOG) 0.1 %, Apply 1 application. topically 2 (two) times daily., Disp: 453.6 g, Rfl: 0   UNABLE TO FIND, Essential oils, Disp: , Rfl:    valACYclovir (VALTREX) 1000 MG tablet, take 2 tablets at the first sign of fever blister. 12 hours later take 2 more , then stop, Disp: , Rfl:   Current Facility-Administered Medications:    botulinum toxin Type A (BOTOX) injection 155  Units, 155 Units, Intramuscular, Q90 days, Glean Salvo, NP, 155 Units at 11/11/22 1114   Medications ordered in this encounter:  Meds ordered this encounter  Medications   amoxicillin (AMOXIL) 500 MG capsule    Sig: Take 1 capsule (500 mg total) by mouth 2 (two) times daily for 10 days.    Dispense:  20 capsule    Refill:  0    Order Specific Question:   Supervising Provider    Answer:   Merrilee Jansky [id number]     *If you need refills on other medications prior to your next appointment, please contact your pharmacy*  Follow-Up: Call back or seek an in-person evaluation if the symptoms worsen or if the condition fails to improve as anticipated.  [facility] [phone]  Other Instructions Warm salt water gargles for throat pain Tea with honey    If you have been instructed to have an in-person evaluation today at a local Urgent Care facility, please use the link below. It will take you to a list of all of our available [facility], including address, phone number and hours of operation. Please do not delay care.  [facility]  If you or a family member do not have a primary care provider, use the link below to schedule a visit and establish care. When you choose a [facility] primary care physician or advanced practice provider, you gain a long-term partner in health. Find a Primary Care Provider  Learn more about [facility]'s in-office and virtual care options: [facility] Get Care Now

## 2023-08-21 ENCOUNTER — Other Ambulatory Visit: Payer: Self-pay | Admitting: Obstetrics & Gynecology

## 2023-08-21 DIAGNOSIS — Z1151 Encounter for screening for human papillomavirus (HPV): Secondary | ICD-10-CM | POA: Diagnosis not present

## 2023-08-21 DIAGNOSIS — Z6828 Body mass index (BMI) 28.0-28.9, adult: Secondary | ICD-10-CM | POA: Diagnosis not present

## 2023-08-21 DIAGNOSIS — N644 Mastodynia: Secondary | ICD-10-CM

## 2023-08-21 DIAGNOSIS — Z124 Encounter for screening for malignant neoplasm of cervix: Secondary | ICD-10-CM | POA: Diagnosis not present

## 2023-08-21 DIAGNOSIS — Z01419 Encounter for gynecological examination (general) (routine) without abnormal findings: Secondary | ICD-10-CM | POA: Diagnosis not present

## 2023-08-27 DIAGNOSIS — N764 Abscess of vulva: Secondary | ICD-10-CM | POA: Diagnosis not present

## 2023-08-28 DIAGNOSIS — Z201 Contact with and (suspected) exposure to tuberculosis: Secondary | ICD-10-CM | POA: Diagnosis not present

## 2023-09-01 DIAGNOSIS — Z201 Contact with and (suspected) exposure to tuberculosis: Secondary | ICD-10-CM | POA: Diagnosis not present

## 2023-09-14 ENCOUNTER — Other Ambulatory Visit: Payer: BC Managed Care – PPO

## 2023-09-14 DIAGNOSIS — G43709 Chronic migraine without aura, not intractable, without status migrainosus: Secondary | ICD-10-CM | POA: Diagnosis not present

## 2023-09-16 ENCOUNTER — Telehealth: Payer: Self-pay | Admitting: Neurology

## 2023-09-16 ENCOUNTER — Ambulatory Visit: Payer: BC Managed Care – PPO | Admitting: Neurology

## 2023-09-16 NOTE — Telephone Encounter (Signed)
 Pt cancelled appt due to having a migraine and daughter is out of school. Transfer to Billing.

## 2023-09-17 MED ORDER — PREDNISONE 5 MG PO TABS: ORAL_TABLET | ORAL | 0 refills | Status: AC

## 2023-09-17 NOTE — Telephone Encounter (Signed)
Rescheduled Botox to 1/21. She states she is really struggling and Maralyn Sago had mentioned something about her having a migraine cocktail done. I told her I would send a message to the pod and have someone call to discuss if this was possible.

## 2023-09-17 NOTE — Telephone Encounter (Signed)
Called and spoke to pt and msg was relayed and agreeable to try prednisone taper as Margie Ege Np.   Send prednisone taper to [facility]. Routing back to sarah slack np to order

## 2023-09-17 NOTE — Telephone Encounter (Addendum)
Call to patient who reports migraine. Her botox was rescheduled to 1/21 and she asking about coming in for a migraine cocktail.   she has only taken her flexeril today and Nurtec yesterday. She has maxalt in her medication list which she has not tried yet and I advised to try that. She stated it has been a while since she used it and it didn't work great for her. She is in agreement to try maxalt again. Advised her I would send to sarah for review but she is not in the office. Encouraged to call back if no relief from the maxalt.   From Dr. Lucia Gaskins visit in 10/2022  From a thorough review of records, medications tried that can be used in migraine management include: flexeril,phenergan,melatonin,benadryl, tylenol, asa, atenolol, baclofen,fioricet,stadol,carbatrol,celexa,decadron, ibuprofen,ketorolac,labetalol, mag,robaxin,mobic,meclizine,reglan,naproxen, zofran,compazine,zoloft,nortriptyline,sumatriptan(gave her palpitations and anxiety),rizatriptan,valproic acid, aimovig contraindicated due to HTN and constipation, topamax, ajovy   She also reports failed [name]

## 2023-09-17 NOTE — Telephone Encounter (Signed)
 Lmtrc 1st attempt

## 2023-09-17 NOTE — Telephone Encounter (Signed)
Recommend taking Maxalt, has Nurtec as needed. If she does well with steroid, we can offer prednisone taper 5 mg, start taking 6 tablets taper by 1 tablet daily until off (total 6 days).

## 2023-09-17 NOTE — Telephone Encounter (Signed)
Prednisone taper sent in. Please let her know Medrol is on her allergy list for n/v. Prednisone is also a steroid so may cause similar symptoms. Monitor. Thanks  Meds ordered this encounter  Medications   predniSONE (DELTASONE) 5 MG tablet    Sig: Start taking 6 tablets, taper by 1 tablet daily until off    Dispense:  21 tablet    Refill:  0

## 2023-09-17 NOTE — Addendum Note (Signed)
Addended by: Glean Salvo on: 09/17/2023 04:32 PM   Modules accepted: Orders

## 2023-09-22 ENCOUNTER — Ambulatory Visit (INDEPENDENT_AMBULATORY_CARE_PROVIDER_SITE_OTHER): Payer: BC Managed Care – PPO | Admitting: Neurology

## 2023-09-22 ENCOUNTER — Telehealth: Payer: BC Managed Care – PPO | Admitting: Physician Assistant

## 2023-09-22 DIAGNOSIS — G43709 Chronic migraine without aura, not intractable, without status migrainosus: Secondary | ICD-10-CM

## 2023-09-22 DIAGNOSIS — K644 Residual hemorrhoidal skin tags: Secondary | ICD-10-CM

## 2023-09-22 MED ORDER — ONABOTULINUMTOXINA 200 UNITS IJ SOLR
155.0000 [IU] | Freq: Once | INTRAMUSCULAR | Status: AC
  Administered 2023-09-22: 155 [IU] via INTRAMUSCULAR

## 2023-09-22 MED ORDER — NURTEC 75 MG PO TBDP: 75.0000 mg | ORAL_TABLET | ORAL | 11 refills | Status: DC

## 2023-09-22 NOTE — Progress Notes (Signed)
   BOTOX PROCEDURE NOTE FOR MIGRAINE HEADACHE  HISTORY: Here for Botox.  This will be her fourth injection.  Last was 06/16/2023 with me. Had to miss her appointment last week, I sent prednisone taper last week, it makes her anxious, nauseated. Has 2 days left. Botox does great up until 2-3 weeks before Botox. Does great with Botox, only 1-2 in the good 2 months. Nurtec is the most effective. For rescue uses Nurtec, Flexeril, phenergan. Stopped Xanax with her rescue cocktail. She would like an MRI of the brain, concerned about having a brain tumor, lately has cognitive fog, slowing, very worrisome to her.    Description of procedure:  The patient was placed in a sitting position. The standard protocol was used for Botox as follows, with 5 units of Botox injected at each site:  -Procerus muscle, midline injection  -Corrugator muscle, bilateral injection  -Frontalis muscle, bilateral injection, with 2 sites each side, medial injection was performed in the upper one third of the frontalis muscle, in the region vertical from the medial inferior edge of the superior orbital rim. The lateral injection was again in the upper one third of the forehead vertically above the lateral limbus of the cornea, 1.5 cm lateral to the medial injection site.  -Temporalis muscle injection, 4 sites, bilaterally. The first injection was 3 cm above the tragus of the ear, second injection site was 1.5 cm to 3 cm up from the first injection site in line with the tragus of the ear. The third injection site was 1.5-3 cm forward between the first 2 injection sites. The fourth injection site was 1.5 cm posterior to the second injection site.  -Occipitalis muscle injection, 3 sites, bilaterally. The first injection was done one half way between the occipital protuberance and the tip of the mastoid process behind the ear. The second injection site was done lateral and superior to the first, 1 fingerbreadth from the first injection.  The third injection site was 1 fingerbreadth superiorly and medially from the first injection site.  -Cervical paraspinal muscle injection, 2 sites, bilateral, the first injection site was 1 cm from the midline of the cervical spine, 3 cm inferior to the lower border of the occipital protuberance. The second injection site was 1.5 cm superiorly and laterally to the first injection site.  -Trapezius muscle injection was performed at 3 sites, bilaterally. The first injection site was in the upper trapezius muscle halfway between the inflection point of the neck, and the acromion. The second injection site was one half way between the acromion and the first injection site. The third injection was done between the first injection site and the inflection point of the neck.   A 200 unit bottle of Botox was used, 155 units were injected, the rest of the Botox was wasted. The patient tolerated the procedure well, there were no complications of the above procedure.  Botox NDC [phone] Lot number [id number] Expiration date 11/2025 SP  Will order MRI of the brain with and without contrast due to patient request.  Patient highly concerned about continued migraines despite aggressive migraine medication management.  Lately has noted some issues with memory, cognitive impairment.  Reports recently had labs with PCP that were normal. Will switch Nurtec to 75 mg every other day for migraine preventative.  Has significant wearing off of Botox.

## 2023-09-22 NOTE — Progress Notes (Signed)
Botox- 200 units x 1 vial Lot: [id number] Expiration: 11/2025 NDC: 2841-3244-01  Bacteriostatic 0.9% Sodium Chloride- * mL  Lot: [id number] Expiration: 07/02/2024 NDC: 6644-0347-42  Dx: V95.638 S/P Witnessed by April J RN

## 2023-09-23 MED ORDER — HYDROCORTISONE ACETATE 25 MG RE SUPP: 25.0000 mg | Freq: Two times a day (BID) | RECTAL | 0 refills | Status: AC

## 2023-09-23 NOTE — Progress Notes (Signed)
I have spent 5 minutes in review of e-visit questionnaire, review and updating patient chart, medical decision making and response to patient.   Mia Milan Cody Jacklynn Dehaas, PA-C    

## 2023-09-23 NOTE — Progress Notes (Signed)

## 2023-09-28 ENCOUNTER — Other Ambulatory Visit: Payer: Self-pay | Admitting: Neurology

## 2023-09-30 ENCOUNTER — Ambulatory Visit: Payer: BC Managed Care – PPO

## 2023-09-30 DIAGNOSIS — G43709 Chronic migraine without aura, not intractable, without status migrainosus: Secondary | ICD-10-CM

## 2023-09-30 MED ORDER — GADOBENATE DIMEGLUMINE 529 MG/ML IV SOLN
15.0000 mL | Freq: Once | INTRAVENOUS | Status: AC | PRN
  Administered 2023-09-30: 15 mL via INTRAVENOUS

## 2023-10-01 ENCOUNTER — Encounter: Payer: Self-pay | Admitting: Neurology

## 2023-10-09 ENCOUNTER — Telehealth: Payer: BC Managed Care – PPO | Admitting: Physician Assistant

## 2023-10-09 DIAGNOSIS — R112 Nausea with vomiting, unspecified: Secondary | ICD-10-CM | POA: Diagnosis not present

## 2023-10-09 MED ORDER — ONDANSETRON 4 MG PO TBDP: 4.0000 mg | ORAL_TABLET | Freq: Three times a day (TID) | ORAL | 0 refills | Status: DC | PRN

## 2023-10-09 MED ORDER — PROMETHAZINE HCL 25 MG RE SUPP: 25.0000 mg | Freq: Four times a day (QID) | RECTAL | 0 refills | Status: AC | PRN

## 2023-10-09 NOTE — Addendum Note (Signed)
 Addended by: Malcom Scriver on: 10/09/2023 06:48 AM   Modules accepted: Orders

## 2023-10-09 NOTE — Progress Notes (Signed)

## 2023-10-14 ENCOUNTER — Encounter (HOSPITAL_BASED_OUTPATIENT_CLINIC_OR_DEPARTMENT_OTHER): Payer: Self-pay | Admitting: Emergency Medicine

## 2023-10-14 ENCOUNTER — Other Ambulatory Visit (HOSPITAL_BASED_OUTPATIENT_CLINIC_OR_DEPARTMENT_OTHER): Payer: Self-pay

## 2023-10-14 ENCOUNTER — Other Ambulatory Visit: Payer: Self-pay

## 2023-10-14 ENCOUNTER — Emergency Department (HOSPITAL_BASED_OUTPATIENT_CLINIC_OR_DEPARTMENT_OTHER): Payer: BC Managed Care – PPO

## 2023-10-14 ENCOUNTER — Emergency Department (HOSPITAL_BASED_OUTPATIENT_CLINIC_OR_DEPARTMENT_OTHER)
Admission: EM | Admit: 2023-10-14 | Discharge: 2023-10-14 | Disposition: A | Discharge status: Home / Self Care | Payer: BC Managed Care – PPO | Attending: Emergency Medicine | Admitting: Emergency Medicine

## 2023-10-14 DIAGNOSIS — E039 Hypothyroidism, unspecified: Secondary | ICD-10-CM | POA: Diagnosis not present

## 2023-10-14 DIAGNOSIS — Z87891 Personal history of nicotine dependence: Secondary | ICD-10-CM | POA: Diagnosis not present

## 2023-10-14 DIAGNOSIS — R1030 Lower abdominal pain, unspecified: Secondary | ICD-10-CM | POA: Diagnosis not present

## 2023-10-14 DIAGNOSIS — N939 Abnormal uterine and vaginal bleeding, unspecified: Secondary | ICD-10-CM | POA: Diagnosis not present

## 2023-10-14 DIAGNOSIS — R102 Pelvic and perineal pain: Secondary | ICD-10-CM | POA: Diagnosis not present

## 2023-10-14 LAB — CBC
HCT: 36.4 % (ref 36.0–46.0)
Hemoglobin: 12.3 g/dL (ref 12.0–15.0)
MCH: 32.2 pg (ref 26.0–34.0)
MCHC: 33.8 g/dL (ref 30.0–36.0)
MCV: 95.3 fL (ref 80.0–100.0)
Platelets: 239 K/uL (ref 150–400)
RBC: 3.82 MIL/uL — ABNORMAL LOW (ref 3.87–5.11)
RDW: 12.4 % (ref 11.5–15.5)
WBC: 6.3 K/uL (ref 4.0–10.5)
nRBC: 0 % (ref 0.0–0.2)

## 2023-10-14 LAB — URINALYSIS, ROUTINE W REFLEX MICROSCOPIC
Bacteria, UA: NONE SEEN
Bilirubin Urine: NEGATIVE
Glucose, UA: NEGATIVE mg/dL
Ketones, ur: NEGATIVE mg/dL
Leukocytes,Ua: NEGATIVE
Nitrite: NEGATIVE
Protein, ur: NEGATIVE mg/dL
Specific Gravity, Urine: 1.014 (ref 1.005–1.030)
pH: 6 (ref 5.0–8.0)

## 2023-10-14 LAB — COMPREHENSIVE METABOLIC PANEL WITH GFR
ALT: 19 U/L (ref 0–44)
AST: 15 U/L (ref 15–41)
Albumin: 3.7 g/dL (ref 3.5–5.0)
Alkaline Phosphatase: 56 U/L (ref 38–126)
Anion gap: 7 (ref 5–15)
BUN: 8 mg/dL (ref 6–20)
CO2: 28 mmol/L (ref 22–32)
Calcium: 8.6 mg/dL — ABNORMAL LOW (ref 8.9–10.3)
Chloride: 105 mmol/L (ref 98–111)
Creatinine, Ser: 0.4 mg/dL — ABNORMAL LOW (ref 0.44–1.00)
GFR, Estimated: >60 mL/min
Glucose, Bld: 96 mg/dL (ref 70–99)
Potassium: 4 mmol/L (ref 3.5–5.1)
Sodium: 140 mmol/L (ref 135–145)
Total Bilirubin: 0.3 mg/dL (ref 0.0–1.2)
Total Protein: 6.2 g/dL — ABNORMAL LOW (ref 6.5–8.1)

## 2023-10-14 LAB — HCG, QUANTITATIVE, PREGNANCY: hCG, Beta Chain, Quant, S: <1 m[IU]/mL

## 2023-10-14 MED ORDER — MEDROXYPROGESTERONE ACETATE 5 MG PO TABS
5.0000 mg | ORAL_TABLET | Freq: Every day | ORAL | 0 refills | Status: DC
  Filled 2023-10-14: qty 10, 10d supply, fill #0

## 2023-10-14 MED ORDER — ONDANSETRON HCL 4 MG/2ML IJ SOLN
4.0000 mg | Freq: Once | INTRAMUSCULAR | Status: AC
  Administered 2023-10-14: 4 mg via INTRAVENOUS
  Filled 2023-10-14: qty 2

## 2023-10-14 MED ORDER — ONDANSETRON 4 MG PO TBDP
4.0000 mg | ORAL_TABLET | Freq: Three times a day (TID) | ORAL | 0 refills | Status: AC | PRN
  Filled 2023-10-14: qty 20, 7d supply, fill #0

## 2023-10-14 MED ORDER — KETOROLAC TROMETHAMINE 15 MG/ML IJ SOLN
15.0000 mg | Freq: Once | INTRAMUSCULAR | Status: AC
  Administered 2023-10-14: 15 mg via INTRAVENOUS
  Filled 2023-10-14: qty 1

## 2023-10-14 MED ORDER — IBUPROFEN 600 MG PO TABS
600.0000 mg | ORAL_TABLET | Freq: Four times a day (QID) | ORAL | 0 refills | Status: AC | PRN
  Filled 2023-10-14: qty 30, 8d supply, fill #0

## 2023-10-14 MED ORDER — HYDROCODONE-ACETAMINOPHEN 5-325 MG PO TABS
1.0000 | ORAL_TABLET | Freq: Once | ORAL | Status: AC
  Administered 2023-10-14: 1 via ORAL
  Filled 2023-10-14: qty 1

## 2023-10-14 NOTE — ED Provider Notes (Signed)
 [place] EMERGENCY DEPARTMENT AT [facility] Provider Note   CSN: [id number] Arrival date & time: 10/14/23  1138     History  Chief Complaint  Patient presents with   Vaginal Bleeding    Lori Marshall is a 48 y.o. female.   Vaginal Bleeding   48 year old female presents emergency department with complaints of vaginal bleeding as well as lower abdominal cramping.  Reports symptoms beginning this morning.  States that she originally was second through 7-8 pads an hour but bleeding is since slowed.  States that she has had heavy menstrual cycles ever since her last child was born but this was appreciably more than normal.  Denies any blood thinner use, trauma to area.  Describes lower abdominal pain is crampy in nature without radiation.  States that the pain is located across her entire lower abdomen/pelvic region.  Denies any history of ovarian mass, fibroids or other abnormalities.  States that the end of her last menstrual cycle was around a month ago although she does not know of exact date.  Past medical history significant for anxiety, depression, C. difficile, hypothyroidism, IBS, migraine  Home Medications Prior to Admission medications   Medication Sig Start Date End Date Taking? Authorizing Provider  ibuprofen (ADVIL) 600 MG tablet Take 1 tablet (600 mg total) by mouth every 6 (six) hours as needed. 10/14/23  Yes Sherian Maroon A, PA  ondansetron (ZOFRAN-ODT) 4 MG disintegrating tablet Take 1 tablet (4 mg total) by mouth every 8 (eight) hours as needed. 10/14/23  Yes Sherian Maroon A, PA  acyclovir (ZOVIRAX) 800 MG tablet     [provider]  albuterol (VENTOLIN HFA) 108 (90 Base) MCG/ACT inhaler Inhale 2 puffs into the lungs every 6 (six) hours as needed for wheezing or shortness of breath. 07/26/23   Delorse Lek, FNP  ALPRAZolam Prudy Feeler) 0.5 MG tablet Take 0.5 mg by mouth daily as needed. 11/08/21   [provider]  botulinum toxin  Type A (BOTOX) 200 units injection INJECT 155 UNITS INTO THE HEAD AND NECK MUSCLES. DISCARD UNUSED PORTION (MUST RECONSTITUTE). 09/28/23   Anson Fret, MD  Chlorphen-Pseudoeph-Methscop (ALLERGY DN PO)  08/04/18   [provider]  cyclobenzaprine (FLEXERIL) 10 MG tablet 1 tablet as needed up to 3x a day for muscle spasms or muscle tension 06/16/23   Glean Salvo, NP  diphenhydrAMINE HCl (BENADRYL ALLERGY PO)     [provider]  famotidine (PEPCID) 20 MG tablet 1 tablet at bedtime as needed    [provider]  fluconazole (DIFLUCAN) 150 MG tablet Take 1 tablet PO once. Repeat in 3 days if needed. 08/07/22   Waldon Merl, PA-C  hydrochlorothiazide (HYDRODIURIL) 12.5 MG tablet Take 25 mg by mouth daily. 05/27/22   [provider]  hydrocortisone (ANUSOL-HC) 25 MG suppository Place 1 suppository (25 mg total) rectally 2 (two) times daily. 09/23/23   Waldon Merl, PA-C  MELATONIN PO     [provider]  nystatin cream (MYCOSTATIN) Apply 1 Application topically 2 (two) times daily. 08/07/22   Waldon Merl, PA-C  Omega-3 Fatty Acids (FISH OIL PO) Take by mouth 2 (two) times daily. 4 tablets 2 morning,2 night    [provider]  Oxymetazoline HCl (RHOFADE) 1 % CREA Rhofade 1 % topical cream  EVENLY APPLY A PEA SIZE AMOUNT TO THE FACE EVERY MORNING. [facility] HANDS AFTER USE    [provider]  predniSONE (DELTASONE) 5 MG tablet Start taking 6  tablets, taper by 1 tablet daily until off 09/17/23   Glean Salvo, NP  promethazine (PHENERGAN) 25 MG suppository Place 1 suppository (25 mg total) rectally every 6 (six) hours as needed for nausea or vomiting. 10/09/23   Mayers, Cari S, PA-C  promethazine (PHENERGAN) 25 MG tablet Take 1 tablet (25 mg total) by mouth every 8 (eight) hours as needed for nausea or vomiting. 06/16/23   Glean Salvo, NP  propranolol (INDERAL) 10 MG tablet Take 10 mg by mouth daily.    [provider]   REPATHA SURECLICK 140 MG/ML SOAJ SMARTSIG:140 Milligram(s) SUB-Q Every 2 Weeks 05/26/22   [provider]  Rimegepant Sulfate (NURTEC) 75 MG TBDP Take 1 tablet (75 mg total) by mouth every other day. 09/22/23   Glean Salvo, NP  rizatriptan (MAXALT-MLT) 10 MG disintegrating tablet Take 1 tablet (10 mg total) by mouth as needed for migraine. May repeat in 2 hours if needed 06/05/22   Anson Fret, MD  Semaglutide, 1 MG/DOSE, (OZEMPIC, 1 MG/DOSE,) 2 MG/1.5ML [facility]  03/12/22   [provider]  triamcinolone cream (KENALOG) 0.1 % Apply 1 application. topically 2 (two) times daily. 12/04/21   Bennie Pierini, FNP  UNABLE TO FIND Essential oils 08/04/18   [provider]  valACYclovir (VALTREX) 1000 MG tablet take 2 tablets at the first sign of fever blister. 12 hours later take 2 more , then stop 10/11/21   [provider]      Allergies    Dust mite extract, Medrol [methylprednisolone], Sertraline hcl, Baclofen, and Novocain [procaine]    Review of Systems   Review of Systems  Genitourinary:  Positive for vaginal bleeding.  All other systems reviewed and are negative.   Physical Exam Updated Vital Signs BP 110/80 (BP Location: Right Arm)   Pulse 75   Temp 97.7 F (36.5 C) (Oral)   Resp 18   Ht 5\' 6"  (1.676 m)   Wt 77.1 kg   SpO2 98%   BMI 27.44 kg/m  Physical Exam Vitals and nursing note reviewed. Exam conducted with a chaperone present.  Constitutional:      General: She is not in acute distress.    Appearance: She is well-developed.  HENT:     Head: Normocephalic and atraumatic.  Eyes:     Conjunctiva/sclera: Conjunctivae normal.  Cardiovascular:     Rate and Rhythm: Normal rate and regular rhythm.     Heart sounds: No murmur heard. Pulmonary:     Effort: Pulmonary effort is normal. No respiratory distress.     Breath sounds: Normal breath sounds.  Abdominal:     Palpations: Abdomen is soft.     Tenderness: There is abdominal  tenderness.     Comments: Suprapubic/midline pelvic tenderness as well as adnexal tenderness bilaterally.  Genitourinary:    Comments: Dark red blood appreciated in vaginal vault with some clots.  Cervical os visualized with initial trickling of darker red blood.  As area was dabbed with a large Q-tip, no active bleeding from cervical os unless pressure was applied via Q-tip.  No obvious lesions in the vaginal vault. Musculoskeletal:        General: No swelling.     Cervical back: Neck supple.  Skin:    General: Skin is warm and dry.     Capillary Refill: Capillary refill takes less than 2 seconds.  Neurological:     Mental Status: She is alert.  Psychiatric:        Mood  and Affect: Mood normal.     ED Results / Procedures / Treatments   Labs (all labs ordered are listed, but only abnormal results are displayed) Labs Reviewed  URINALYSIS, ROUTINE W REFLEX MICROSCOPIC - Abnormal; Notable for the following components:      Result Value   Hgb urine dipstick LARGE (*)    All other components within normal limits  COMPREHENSIVE METABOLIC PANEL - Abnormal; Notable for the following components:   Creatinine, Ser 0.40 (*)    Calcium 8.6 (*)    Total Protein 6.2 (*)    All other components within normal limits  CBC - Abnormal; Notable for the following components:   RBC 3.82 (*)    All other components within normal limits  HCG, QUANTITATIVE, PREGNANCY    EKG None  Radiology US PELVIC COMPLETE W TRANSVAGINAL AND TORSION R/O Result Date: 10/14/2023 CLINICAL DATA:  Pelvic pain and heavy vaginal bleeding. EXAM: TRANSABDOMINAL AND TRANSVAGINAL ULTRASOUND OF PELVIS DOPPLER ULTRASOUND OF OVARIES TECHNIQUE: Both transabdominal and transvaginal ultrasound examinations of the pelvis were performed. Transabdominal technique was performed for global imaging of the pelvis including uterus, ovaries, adnexal regions, and pelvic cul-de-sac. It was necessary to proceed with endovaginal exam following  the transabdominal exam to visualize the uterus, endometrium, ovaries, and adnexa. Color and duplex Doppler ultrasound was utilized to evaluate blood flow to the ovaries. COMPARISON:  Pelvic ultrasound dated March 09, 2015. FINDINGS: Uterus Measurements: 7.2 x 3.7 x 4.7 cm = volume: 65 mL. No fibroids or other mass visualized. Endometrium Thickness: 11 mm.  No focal abnormality visualized. Right ovary Measurements: 2.6 x 1.4 x 2.4 cm = volume: 4.5 mL. Normal appearance/no adnexal mass. Left ovary Measurements: 3.3 x 1.6 x 1.3 cm = volume: 3.5 mL. Normal appearance of the ovary. 1.6 cm paraovarian cyst. No follow-up imaging is recommended. Pulsed Doppler evaluation of both ovaries demonstrates normal low-resistance arterial and venous waveforms. Other findings No abnormal free fluid. IMPRESSION: 1. Normal pelvic ultrasound. Electronically Signed   By: Obie Dredge M.D.   On: 10/14/2023 15:46    Procedures Procedures    Medications Ordered in ED Medications  HYDROcodone-acetaminophen (NORCO/VICODIN) 5-325 MG per tablet 1 tablet (1 tablet Oral Given 10/14/23 1408)  ondansetron (ZOFRAN) injection 4 mg (4 mg Intravenous Given 10/14/23 1414)  ketorolac (TORADOL) 15 MG/ML injection 15 mg (15 mg Intravenous Given 10/14/23 1636)    ED Course/ Medical Decision Making/ A&P                                 Medical Decision Making Amount and/or Complexity of Data Reviewed Labs: ordered. Radiology: ordered.  Risk Prescription drug management.   This patient presents to the ED for concern of vaginal bleeding, this involves an extensive number of treatment options, and is a complaint that carries with it a high risk of complications and morbidity.  The differential diagnosis includes endometriosis, malignancy/hyperplasia, coagulopathy, fibroid,, ovarian dysfunction, coagulopathy, other   Co morbidities that complicate the patient evaluation  See HPI   Additional history obtained:  Additional  history obtained from EMR External records from outside source obtained and reviewed including hospital records   Lab Tests:  I Ordered, and personally interpreted labs.  The pertinent results include: Mild hypocalcemia of 8.6 but otherwise, chest with normal limits.  No transaminitis.  No renal dysfunction.  No leukocytosis.  No evidence of anemia.  Placed within range.  UA with hemoglobin, RBCs  present but most likely contaminated from vaginal bleeding otherwise, unremarkable UA.  hCG negative.   Imaging Studies ordered:  I ordered imaging studies including pelvic ReSound. I independently visualized and interpreted imaging which showed 1.6 cm left-sided ovarian cyst.  Otherwise normal-appearing ultrasound of pelvis. I agree with the radiologist interpretation   Cardiac Monitoring: / EKG:  The patient was maintained on a cardiac monitor.  I personally viewed and interpreted the cardiac monitored which showed an underlying rhythm of: Sinus rhythm   Consultations Obtained:  N/a   Problem List / ED Course / Critical interventions / Medication management  Vaginal bleeding I ordered medication including Norco, Zofran, Toradol  Reevaluation of the patient after these medicines showed that the patient improved I have reviewed the patients home medicines and have made adjustments as needed   Social Determinants of Health:  Former cigarette use.  Denies illicit drug use.   Test / Admission - Considered:  Vaginal bleeding Vitals signs within normal range and stable throughout visit. Laboratory/imaging studies significant for: See above 48 year old female presents emergency department with onset of vaginal bleeding this morning as well as pelvic pain.  On exam, tenderness lower middle abdomen with some adnexal tenderness noted bilaterally.  Laboratory studies reassuring with normal hemoglobin.  Pelvic ultrasound as above with dark red-colored bleeding in vaginal vault with very  minimal bleeding from cervical os with pressure being applied but not appreciated otherwise.  Pelvic ultrasound also reassuring with small left-sided paraovarian cyst 1.6 cm but otherwise unremarkable ultrasound of patient's pelvis.  Patient symptoms could be secondary to recently ruptured ovarian cyst, ovarian dysfunction, other.  Patient not actively exsanguinating.  Given the patient has upcoming mammography for concerning left-sided breast mass, will refrain from chemotherapy for treatment of patient's abnormal uterine bleeding.  Bleeding has been improving since symptom onset this morning.  Will trial NSAIDs and recommend follow-up with OB/GYN in the outpatient setting.  Treatment plan discussed at length with patient and she acknowledged understanding was agreeable to said plan.  Patient overall well-appearing, afebrile in no acute distress. Worrisome signs and symptoms were discussed with the patient, and the patient acknowledged understanding to return to the ED if noticed. Patient was stable upon discharge.          Final Clinical Impression(s) / ED Diagnoses Final diagnoses:  Abnormal uterine bleeding    Rx / DC Orders      Peter Garter, PA 10/14/23 1703    Sloan Leiter, DO 10/15/23 [phone]

## 2023-10-14 NOTE — Discharge Instructions (Addendum)
As discussed, workup today overall reassuring.  Your hemoglobin was normal.  Pelvic ultrasound did show a left-sided ovarian cyst but otherwise was normal.  Most common cause in this situation is ovarian dysfunction given these to have menstrual cycles.  Will try high-dose anti-inflammatory for treatment of your symptoms.  Please take ibuprofen every 6 hours for the next 5 to 7 days; this medication should help with pain as well as bleeding..  Will additionally prescribed medroxyprogesterone to trial if you notice no improvement over the next 24 to 36 hours of vaginal bleeding.  If you begin to take this, please take for at least 5 days but will prescribe a 10-day course if needed.  Recommend calling her OB/GYN for scheduling appointment for reevaluation/further assessment of vaginal bleeding.  If your bleeding does not continue to improve or worsens despite treatment, please return to the emergency department for repeat assessment/evaluation.

## 2023-10-14 NOTE — ED Triage Notes (Signed)
Reports heavy vaginal bleeding starting this morning. Reports soaking 7-8 pads and hour. C/o lower back pain and dizziness.

## 2023-10-15 ENCOUNTER — Other Ambulatory Visit: Payer: BC Managed Care – PPO

## 2023-10-15 ENCOUNTER — Telehealth: Payer: Self-pay | Admitting: Pharmacist

## 2023-10-15 NOTE — Telephone Encounter (Signed)
Pharmacy Patient Advocate Encounter   Received notification from CoverMyMeds that prior authorization for Nurtec 75MG  dispersible tablets is required/requested.   Insurance verification completed.   The patient is insured through [facility] .   Per test claim: PA required; PA submitted to above mentioned insurance via CoverMyMeds Key/confirmation #/EOC [id number] Status is pending

## 2023-10-21 ENCOUNTER — Telehealth: Payer: Self-pay

## 2023-10-21 NOTE — Telephone Encounter (Signed)
 Pharmacy Patient Advocate Encounter   Received notification from Fax that prior authorization for Nurtec is required/requested.   Insurance verification completed.   The patient is insured through [facility] .   Per test claim: PA required; PA submitted to above mentioned insurance via Fax Key/confirmation #/EOC [id number] Status is pending

## 2023-10-21 NOTE — Telephone Encounter (Signed)
 PA request has been  received for additional information . New Encounter created for follow up. For additional info see Pharmacy Prior Auth telephone encounter from 02/19.

## 2023-10-27 ENCOUNTER — Telehealth: Payer: Self-pay | Admitting: Pharmacist

## 2023-10-27 ENCOUNTER — Encounter: Payer: Self-pay | Admitting: Neurology

## 2023-10-27 MED ORDER — NURTEC 75 MG PO TBDP: 75.0000 mg | ORAL_TABLET | ORAL | 11 refills | Status: DC | PRN

## 2023-10-27 NOTE — Addendum Note (Signed)
 Addended by: Glean Salvo on: 10/27/2023 04:45 PM   Modules accepted: Orders

## 2023-10-27 NOTE — Telephone Encounter (Signed)
 Insurance will not pay for Nurtec as preventative along with Botox.  Will order as abortive therapy.  Meds ordered this encounter  Medications   Rimegepant Sulfate (NURTEC) 75 MG TBDP    Sig: Take 1 tablet (75 mg total) by mouth as needed.    Dispense:  8 tablet    Refill:  11

## 2023-10-27 NOTE — Telephone Encounter (Signed)
 Pharmacy Patient Advocate Encounter  Received notification from [facility] that Prior Authorization for  Nurtec 75MG  dispersible tablets  has been DENIED.  Full denial letter will be uploaded to the media tab. See denial reason below.   PA #/Case ID/Reference #: [id number]     Based on January chart notes, patient was being switched to Nurtec as preventative therapy but received Botox on 09/22/2023 and is scheduled to receive another dose again on 12/30/2023. Insurance will not approve Nurtec preventative therapy if they have received Botox within the past 3 months.

## 2023-10-28 ENCOUNTER — Ambulatory Visit
Admission: RE | Admit: 2023-10-28 | Discharge: 2023-10-28 | Disposition: A | Discharge status: Home / Self Care | Payer: BC Managed Care – PPO | Source: Ambulatory Visit | Attending: Obstetrics & Gynecology | Admitting: Obstetrics & Gynecology

## 2023-10-28 DIAGNOSIS — N644 Mastodynia: Secondary | ICD-10-CM

## 2023-10-28 MED ORDER — BOTOX 200 UNITS IJ SOLR: INTRAMUSCULAR | 3 refills | Status: AC

## 2023-10-30 ENCOUNTER — Other Ambulatory Visit (HOSPITAL_COMMUNITY): Payer: Self-pay

## 2023-10-30 NOTE — Telephone Encounter (Signed)
 See denial reason in encounter 02/25

## 2023-11-17 ENCOUNTER — Telehealth: Admitting: Physician Assistant

## 2023-11-17 DIAGNOSIS — B001 Herpesviral vesicular dermatitis: Secondary | ICD-10-CM

## 2023-11-17 MED ORDER — ACYCLOVIR 800 MG PO TABS: 800.0000 mg | ORAL_TABLET | Freq: Two times a day (BID) | ORAL | 0 refills | Status: AC

## 2023-11-17 NOTE — Progress Notes (Signed)
 E-Visit for [employer]  We are sorry that you are not feeling well.  Here is how we plan to help!  Based on what you have shared with me it does look like you have a viral infection.    Most cold sores or fever blisters are small fluid filled blisters around the mouth caused by herpes simplex virus.  The most common strain of the virus causing cold sores is herpes simplex virus 1.  It can be spread by skin contact, sharing eating utensils, or even sharing towels.  Cold sores are contagious to other people until dry. (Approximately 5-7 days).  Wash your hands. You can spread the virus to your eyes through handling your contact lenses after touching the lesions.  Most people experience pain at the sight or tingling sensations in their lips that may begin before the ulcers erupt.  Herpes simplex is treatable but not curable.  It may lie dormant for a long time and then reappear due to stress or prolonged sun exposure.  Many patients have success in treating their cold sores with an over the counter topical called Abreva.  You may apply the cream up to 5 times daily (maximum 10 days) until healing occurs.  If you would like to use an oral antiviral medication to speed the healing of your cold sore, I have sent a prescription to your local pharmacy Acyclovir 800 mg take one by mouth twice a day for 7 days    HOME CARE:  Wash your hands frequently. Do not pick at or rub the sore. Don't open the blisters. Avoid kissing other people during this time. Avoid sharing drinking glasses, eating utensils, or razors. Do not handle contact lenses unless you have thoroughly washed your hands with soap and warm water! Avoid oral sex during this time.  Herpes from sores on your mouth can spread to your partner's genital area. Avoid contact with anyone who has eczema or a weakened immune system. Cold sores are often triggered by exposure to intense sunlight, use a lip balm containing a sunscreen (SPF 30 or  higher).  GET HELP RIGHT AWAY IF:  Blisters look infected. Blisters occur near or in the eye. Symptoms last longer than 10 days. Your symptoms become worse.  MAKE SURE YOU:  Understand these instructions. Will watch your condition. Will get help right away if you are not doing well or get worse.    Your e-visit answers were reviewed by a board certified advanced clinical practitioner to complete your personal care plan.  Depending upon the condition, your plan could have  Included both over the counter or prescription medications.    Please review your pharmacy choice.  Be sure that the pharmacy you have chosen is open so that you can pick up your prescription now.  If there is a problem you can message your provider in MyChart to have the prescription routed to another pharmacy.    Your safety is important to [place].  If you have drug allergies check our prescription carefully.  For the next 24 hours you can use MyChart to ask questions about today's visit, request a non-urgent call back, or ask for a work or school excuse from your e-visit provider.  You will get an email in the next two days asking about your experience.  I hope that your e-visit has been valuable and will speed your recovery.    I have spent 5 minutes in review of e-visit questionnaire, review and updating patient chart, medical  decision making and response to patient.   Margaretann Loveless, PA-C

## 2023-12-11 ENCOUNTER — Telehealth: Payer: Self-pay

## 2023-12-11 ENCOUNTER — Other Ambulatory Visit (HOSPITAL_COMMUNITY): Payer: Self-pay

## 2023-12-11 NOTE — Telephone Encounter (Signed)
 Pharmacy Patient Advocate Encounter   Received notification from Fax that prior authorization for Nurtec 75MG  dispersible tablets is required/requested.   Insurance verification completed.   The patient is insured through [employer] .   Per test claim: PA required; PA submitted to above mentioned insurance via CoverMyMeds Key/confirmation #/EOC [id number] Status is pending

## 2023-12-11 NOTE — Telephone Encounter (Signed)
 Pharmacy Patient Advocate Encounter  Received notification from [facility] that Prior Authorization for Nurtec 75MG  dispersible tablets has been APPROVED from 12/11/2023 to 12/10/2024   PA #/Case ID/Reference #: PA Case ID #: [id number]

## 2023-12-21 DIAGNOSIS — G43709 Chronic migraine without aura, not intractable, without status migrainosus: Secondary | ICD-10-CM | POA: Diagnosis not present

## 2023-12-22 ENCOUNTER — Ambulatory Visit: Payer: BC Managed Care – PPO | Admitting: Neurology

## 2023-12-24 ENCOUNTER — Ambulatory Visit: Payer: BC Managed Care – PPO | Admitting: Neurology

## 2023-12-30 ENCOUNTER — Ambulatory Visit (INDEPENDENT_AMBULATORY_CARE_PROVIDER_SITE_OTHER): Payer: BC Managed Care – PPO | Admitting: Neurology

## 2023-12-30 VITALS — BP 124/87

## 2023-12-30 DIAGNOSIS — G43709 Chronic migraine without aura, not intractable, without status migrainosus: Secondary | ICD-10-CM

## 2023-12-30 DIAGNOSIS — G43009 Migraine without aura, not intractable, without status migrainosus: Secondary | ICD-10-CM

## 2023-12-30 MED ORDER — ONABOTULINUMTOXINA 200 UNITS IJ SOLR
155.0000 [IU] | Freq: Once | INTRAMUSCULAR | Status: AC
  Administered 2023-12-30: 155 [IU] via INTRAMUSCULAR

## 2023-12-30 MED ORDER — NURTEC 75 MG PO TBDP: 75.0000 mg | ORAL_TABLET | ORAL | 11 refills | Status: AC | PRN

## 2023-12-30 NOTE — Progress Notes (Signed)
   BOTOX  PROCEDURE NOTE FOR MIGRAINE HEADACHE  HISTORY: Lori Marshall is here for Botox .  Last was 09/22/2023 with me.  MRI of the brain with and without contrast was normal. Insurance wouldn't pay for Nurtec as prevention, but approved # 8 tablets for rescue.  Botox  does great 1-2 migraines a month, it does wear off about 2 weeks before.   Description of procedure:  The patient was placed in a sitting position. The standard protocol was used for Botox  as follows, with 5 units of Botox  injected at each site:  -Procerus muscle, midline injection  -Corrugator muscle, bilateral injection  -Frontalis muscle, bilateral injection, with 2 sites each side, medial injection was performed in the upper one third of the frontalis muscle, in the region vertical from the medial inferior edge of the superior orbital rim. The lateral injection was again in the upper one third of the forehead vertically above the lateral limbus of the cornea, 1.5 cm lateral to the medial injection site.  -Temporalis muscle injection, 4 sites, bilaterally. The first injection was 3 cm above the tragus of the ear, second injection site was 1.5 cm to 3 cm up from the first injection site in line with the tragus of the ear. The third injection site was 1.5-3 cm forward between the first 2 injection sites. The fourth injection site was 1.5 cm posterior to the second injection site.  -Occipitalis muscle injection, 3 sites, bilaterally. The first injection was done one half way between the occipital protuberance and the tip of the mastoid process behind the ear. The second injection site was done lateral and superior to the first, 1 fingerbreadth from the first injection. The third injection site was 1 fingerbreadth superiorly and medially from the first injection site.  -Cervical paraspinal muscle injection, 2 sites, bilateral, the first injection site was 1 cm from the midline of the cervical spine, 3 cm inferior to the lower  border of the occipital protuberance. The second injection site was 1.5 cm superiorly and laterally to the first injection site.  -Trapezius muscle injection was performed at 3 sites, bilaterally. The first injection site was in the upper trapezius muscle halfway between the inflection point of the neck, and the acromion. The second injection site was one half way between the acromion and the first injection site. The third injection was done between the first injection site and the inflection point of the neck.   A 200 unit bottle of Botox  was used, 155 units were injected, the rest of the Botox  was wasted. The patient tolerated the procedure well, there were no complications of the above procedure.  Botox  NDC 8841-6606-30 Lot number [id number] Expiration date 05/2026 SP

## 2023-12-30 NOTE — Progress Notes (Signed)
 Botox - 200 units x 1 vial Lot: [id number] Expiration: 05/2026 NDC: 2956-2130-86  Bacteriostatic 0.9% Sodium Chloride - 4 mL  Lot: [id number] Expiration: 07/02/2024 NDC: 6295-2841-32  Dx: G40.102 S/P Witnessed by Ronita Cohens RMA

## 2024-01-08 DIAGNOSIS — R7301 Impaired fasting glucose: Secondary | ICD-10-CM | POA: Diagnosis not present

## 2024-01-08 DIAGNOSIS — R946 Abnormal results of thyroid function studies: Secondary | ICD-10-CM | POA: Diagnosis not present

## 2024-01-08 DIAGNOSIS — E785 Hyperlipidemia, unspecified: Secondary | ICD-10-CM | POA: Diagnosis not present

## 2024-01-13 DIAGNOSIS — R82998 Other abnormal findings in urine: Secondary | ICD-10-CM | POA: Diagnosis not present

## 2024-01-13 DIAGNOSIS — I10 Essential (primary) hypertension: Secondary | ICD-10-CM | POA: Diagnosis not present

## 2024-01-13 DIAGNOSIS — Z0000 Encounter for general adult medical examination without abnormal findings: Secondary | ICD-10-CM | POA: Diagnosis not present

## 2024-02-24 ENCOUNTER — Telehealth: Payer: Self-pay | Admitting: Neurology

## 2024-02-24 NOTE — Telephone Encounter (Signed)
 Completed [employer] PA renewal and faxed with notes to [phone].

## 2024-02-25 NOTE — Telephone Encounter (Signed)
 Received fax of approval, pt will continue to fill through [facility].   Auth#: [id number] (03/17/24-02/16/25)

## 2024-03-01 DIAGNOSIS — L819 Disorder of pigmentation, unspecified: Secondary | ICD-10-CM | POA: Diagnosis not present

## 2024-03-01 DIAGNOSIS — D2261 Melanocytic nevi of right upper limb, including shoulder: Secondary | ICD-10-CM | POA: Diagnosis not present

## 2024-03-01 DIAGNOSIS — D2272 Melanocytic nevi of left lower limb, including hip: Secondary | ICD-10-CM | POA: Diagnosis not present

## 2024-03-01 DIAGNOSIS — L814 Other melanin hyperpigmentation: Secondary | ICD-10-CM | POA: Diagnosis not present

## 2024-03-14 DIAGNOSIS — G43709 Chronic migraine without aura, not intractable, without status migrainosus: Secondary | ICD-10-CM | POA: Diagnosis not present

## 2024-03-23 ENCOUNTER — Ambulatory Visit (INDEPENDENT_AMBULATORY_CARE_PROVIDER_SITE_OTHER): Admitting: Neurology

## 2024-03-23 ENCOUNTER — Encounter: Payer: Self-pay | Admitting: Neurology

## 2024-03-23 VITALS — BP 128/91 | HR 82

## 2024-03-23 DIAGNOSIS — G43009 Migraine without aura, not intractable, without status migrainosus: Secondary | ICD-10-CM

## 2024-03-23 MED ORDER — ONABOTULINUMTOXINA 200 UNITS IJ SOLR
155.0000 [IU] | Freq: Once | INTRAMUSCULAR | Status: AC
  Administered 2024-03-23: 155 [IU] via INTRAMUSCULAR

## 2024-03-23 NOTE — Progress Notes (Signed)
   BOTOX  PROCEDURE NOTE FOR MIGRAINE HEADACHE   HISTORY: Lori Marshall is here for Botox . Last was 12/30/23 with me. Had a great 3 months! Has found that Nurtec, phenergan , Flexeril  is a good cocktail for migraine.  Migraine 1-2 times a month, does wear off Botox  about 2 weeks before. The last 3 months has been the best yet!  Description of procedure:  The patient was placed in a sitting position. The standard protocol was used for Botox  as follows, with 5 units of Botox  injected at each site:  -Procerus muscle, midline injection  -Corrugator muscle, bilateral injection  -Frontalis muscle, bilateral injection, with 2 sites each side, medial injection was performed in the upper one third of the frontalis muscle, in the region vertical from the medial inferior edge of the superior orbital rim. The lateral injection was again in the upper one third of the forehead vertically above the lateral limbus of the cornea, 1.5 cm lateral to the medial injection site.  -Temporalis muscle injection, 4 sites, bilaterally. The first injection was 3 cm above the tragus of the ear, second injection site was 1.5 cm to 3 cm up from the first injection site in line with the tragus of the ear. The third injection site was 1.5-3 cm forward between the first 2 injection sites. The fourth injection site was 1.5 cm posterior to the second injection site.  -Occipitalis muscle injection, 3 sites, bilaterally. The first injection was done one half way between the occipital protuberance and the tip of the mastoid process behind the ear. The second injection site was done lateral and superior to the first, 1 fingerbreadth from the first injection. The third injection site was 1 fingerbreadth superiorly and medially from the first injection site.  -Cervical paraspinal muscle injection, 2 sites, bilateral, the first injection site was 1 cm from the midline of the cervical spine, 3 cm inferior to the lower border of the  occipital protuberance. The second injection site was 1.5 cm superiorly and laterally to the first injection site.  -Trapezius muscle injection was performed at 3 sites, bilaterally. The first injection site was in the upper trapezius muscle halfway between the inflection point of the neck, and the acromion. The second injection site was one half way between the acromion and the first injection site. The third injection was done between the first injection site and the inflection point of the neck.   A 200 unit bottle of Botox  was used, 155 units were injected, the rest of the Botox  was wasted. The patient tolerated the procedure well, there were no complications of the above procedure.  Botox  NDC 9976-6078-97 Lot number [id number] Expiration date 08/2026 SP

## 2024-03-23 NOTE — Progress Notes (Signed)
 Botox - 200 units x 1 vial Lot: [id number] Expiration: 08/2026 NDC: 9976-6078-97  Bacteriostatic 0.9% Sodium Chloride - 30 mL  Lot: [id number] Expiration: OCT-31-2026 NDC: 9590-8033-97  Dx:G43.009 S/P Witnessed ab:Zffj Joshua, RN

## 2024-03-24 ENCOUNTER — Ambulatory Visit: Admitting: Neurology

## 2024-04-22 ENCOUNTER — Telehealth: Admitting: Nurse Practitioner

## 2024-04-22 DIAGNOSIS — B001 Herpesviral vesicular dermatitis: Secondary | ICD-10-CM

## 2024-04-23 MED ORDER — ACYCLOVIR 5 % EX OINT: 1.0000 | TOPICAL_OINTMENT | Freq: Four times a day (QID) | CUTANEOUS | 0 refills | Status: AC

## 2024-04-23 NOTE — Progress Notes (Signed)
 I have spent 5 minutes in review of e-visit questionnaire, review and updating patient chart, medical decision making and response to patient.   Claiborne Rigg, NP

## 2024-04-23 NOTE — Progress Notes (Signed)
 E-Visit for [employer]  We are sorry that you are not feeling well.  Here is how we plan to help!  Based on what you have shared with me it does look like you have a viral infection.    Most cold sores or fever blisters are small fluid filled blisters around the mouth caused by herpes simplex virus.  The most common strain of the virus causing cold sores is herpes simplex virus 1.  It can be spread by skin contact, sharing eating utensils, or even sharing towels.  Cold sores are contagious to other people until dry. (Approximately 5-7 days).  Wash your hands. You can spread the virus to your eyes through handling your contact lenses after touching the lesions.  Most people experience pain at the sight or tingling sensations in their lips that may begin before the ulcers erupt.  Herpes simplex is treatable but not curable.  It may lie dormant for a long time and then reappear due to stress or prolonged sun exposure.  Many patients have success in treating their cold sores with an over the counter topical called Abreva.  You may apply the cream up to 5 times daily (maximum 10 days) until healing occurs.  If you would like to use an oral antiviral medication to speed the healing of your cold sore, I have sent a prescription to your local pharmacy of zovirax  ointment. The cream is not covered by your insurance.     HOME CARE:  Wash your hands frequently. Do not pick at or rub the sore. Don't open the blisters. Avoid kissing other people during this time. Avoid sharing drinking glasses, eating utensils, or razors. Do not handle contact lenses unless you have thoroughly washed your hands with soap and warm water! Avoid oral sex during this time.  Herpes from sores on your mouth can spread to your partner's genital area. Avoid contact with anyone who has eczema or a weakened immune system. Cold sores are often triggered by exposure to intense sunlight, use a lip balm containing a sunscreen (SPF 30  or higher).  GET HELP RIGHT AWAY IF:  Blisters look infected. Blisters occur near or in the eye. Symptoms last longer than 10 days. Your symptoms become worse.  MAKE SURE YOU:  Understand these instructions. Will watch your condition. Will get help right away if you are not doing well or get worse.    Your e-visit answers were reviewed by a board certified advanced clinical practitioner to complete your personal care plan.  Depending upon the condition, your plan could have  Included both over the counter or prescription medications.    Please review your pharmacy choice.  Be sure that the pharmacy you have chosen is open so that you can pick up your prescription now.  If there is a problem you can message your provider in MyChart to have the prescription routed to another pharmacy.    Your safety is important to us .  If you have drug allergies check our prescription carefully.  For the next 24 hours you can use MyChart to ask questions about today's visit, request a non-urgent call back, or ask for a work or school excuse from your e-visit provider.  You will get an email in the next two days asking about your experience.  I hope that your e-visit has been valuable and will speed your recovery.

## 2024-06-07 DIAGNOSIS — G43709 Chronic migraine without aura, not intractable, without status migrainosus: Secondary | ICD-10-CM | POA: Diagnosis not present

## 2024-06-22 ENCOUNTER — Ambulatory Visit: Payer: Self-pay | Admitting: Neurology

## 2024-06-22 ENCOUNTER — Encounter: Payer: Self-pay | Admitting: Neurology

## 2024-06-22 ENCOUNTER — Ambulatory Visit: Admitting: Neurology

## 2024-06-22 VITALS — BP 129/86 | HR 80

## 2024-06-22 DIAGNOSIS — G43009 Migraine without aura, not intractable, without status migrainosus: Secondary | ICD-10-CM

## 2024-06-22 MED ORDER — ONABOTULINUMTOXINA 200 UNITS IJ SOLR
155.0000 [IU] | Freq: Once | INTRAMUSCULAR | Status: AC
  Administered 2024-06-22: 155 [IU] via INTRAMUSCULAR

## 2024-06-22 MED ORDER — ONABOTULINUMTOXINA 200 UNITS IJ SOLR: 155.0000 [IU] | Freq: Once | INTRAMUSCULAR | Status: DC

## 2024-06-22 NOTE — Progress Notes (Signed)
   BOTOX  PROCEDURE NOTE FOR MIGRAINE HEADACHE   HISTORY: Lori Marshall is here for Botox . Last was 03/23/24 with me. Under more stress, not sleeping. Botox  wears off, for the last month 3 migraines a week. Nurtec works great. Her new insurance starts 11/1.   Description of procedure:  The patient was placed in a sitting position. The standard protocol was used for Botox  as follows, with 5 units of Botox  injected at each site:   -Procerus muscle, midline injection  -Corrugator muscle, bilateral injection  -Frontalis muscle, bilateral injection, with 2 sites each side, medial injection was performed in the upper one third of the frontalis muscle, in the region vertical from the medial inferior edge of the superior orbital rim. The lateral injection was again in the upper one third of the forehead vertically above the lateral limbus of the cornea, 1.5 cm lateral to the medial injection site.  -Temporalis muscle injection, 4 sites, bilaterally. The first injection was 3 cm above the tragus of the ear, second injection site was 1.5 cm to 3 cm up from the first injection site in line with the tragus of the ear. The third injection site was 1.5-3 cm forward between the first 2 injection sites. The fourth injection site was 1.5 cm posterior to the second injection site.  -Occipitalis muscle injection, 3 sites, bilaterally. The first injection was done one half way between the occipital protuberance and the tip of the mastoid process behind the ear. The second injection site was done lateral and superior to the first, 1 fingerbreadth from the first injection. The third injection site was 1 fingerbreadth superiorly and medially from the first injection site.  -Cervical paraspinal muscle injection, 2 sites, bilateral, the first injection site was 1 cm from the midline of the cervical spine, 3 cm inferior to the lower border of the occipital protuberance. The second injection site was 1.5 cm  superiorly and laterally to the first injection site.  -Trapezius muscle injection was performed at 3 sites, bilaterally. The first injection site was in the upper trapezius muscle halfway between the inflection point of the neck, and the acromion. The second injection site was one half way between the acromion and the first injection site. The third injection was done between the first injection site and the inflection point of the neck.   A 200 unit bottle of Botox  was used, 155 units were injected, the rest of the Botox  was wasted. The patient tolerated the procedure well, there were no complications of the above procedure.  Botox  NDC [phone] Lot number [id number] Expiration date 01/2026 SP  If continues to have breakthrough migraines after a few weeks consider adding on Ajovy or Emgality or Qulipta.   Copied Dr. Sharion note 10/07/23: From a thorough review of records, medications tried that can be used in migraine management include: flexeril ,phenergan ,melatonin,benadryl , tylenol , asa, atenolol , baclofen ,fioricet ,stadol ,carbatrol ,celexa,decadron , ibuprofen ,ketorolac ,labetalol , mag,robaxin ,mobic,meclizine ,reglan ,naproxen , zofran ,compazine ,zoloft ,nortriptyline,sumatriptan (gave her palpitations and anxiety),rizatriptan ,valproic  acid, aimovig contraindicated due to HTN and constipation, topamax, ajovy

## 2024-06-22 NOTE — Progress Notes (Signed)
 Botox - 200 units x 1 vial Lot: [id number] Expiration: 6/27 NDC: 9976-6078-97  Bacteriostatic 0.9% Sodium Chloride - 4 mL  Lot: [id number] Expiration: 07/01/25 NDC: 9590-8033-97  Dx: H56.990 S/P Witnessed by Maurilio, RN

## 2024-07-05 DIAGNOSIS — R7301 Impaired fasting glucose: Secondary | ICD-10-CM | POA: Diagnosis not present

## 2024-07-05 DIAGNOSIS — I10 Essential (primary) hypertension: Secondary | ICD-10-CM | POA: Diagnosis not present

## 2024-07-05 DIAGNOSIS — E785 Hyperlipidemia, unspecified: Secondary | ICD-10-CM | POA: Diagnosis not present

## 2024-07-05 DIAGNOSIS — R946 Abnormal results of thyroid function studies: Secondary | ICD-10-CM | POA: Diagnosis not present

## 2024-07-12 DIAGNOSIS — F419 Anxiety disorder, unspecified: Secondary | ICD-10-CM | POA: Diagnosis not present

## 2024-07-12 DIAGNOSIS — R7301 Impaired fasting glucose: Secondary | ICD-10-CM | POA: Diagnosis not present

## 2024-07-12 DIAGNOSIS — I10 Essential (primary) hypertension: Secondary | ICD-10-CM | POA: Diagnosis not present

## 2024-07-12 DIAGNOSIS — G8929 Other chronic pain: Secondary | ICD-10-CM | POA: Diagnosis not present

## 2024-07-12 DIAGNOSIS — Z6830 Body mass index (BMI) 30.0-30.9, adult: Secondary | ICD-10-CM | POA: Diagnosis not present

## 2024-07-12 DIAGNOSIS — E669 Obesity, unspecified: Secondary | ICD-10-CM | POA: Diagnosis not present

## 2024-07-12 DIAGNOSIS — F39 Unspecified mood [affective] disorder: Secondary | ICD-10-CM | POA: Diagnosis not present

## 2024-07-12 DIAGNOSIS — F410 Panic disorder [episodic paroxysmal anxiety] without agoraphobia: Secondary | ICD-10-CM | POA: Diagnosis not present

## 2024-07-12 DIAGNOSIS — F309 Manic episode, unspecified: Secondary | ICD-10-CM | POA: Diagnosis not present

## 2024-07-12 DIAGNOSIS — T148XXA Other injury of unspecified body region, initial encounter: Secondary | ICD-10-CM | POA: Diagnosis not present

## 2024-07-13 DIAGNOSIS — Z419 Encounter for procedure for purposes other than remedying health state, unspecified: Secondary | ICD-10-CM | POA: Diagnosis not present

## 2024-07-25 DIAGNOSIS — I10 Essential (primary) hypertension: Secondary | ICD-10-CM | POA: Diagnosis not present

## 2024-07-25 DIAGNOSIS — F419 Anxiety disorder, unspecified: Secondary | ICD-10-CM | POA: Diagnosis not present

## 2024-07-25 DIAGNOSIS — F410 Panic disorder [episodic paroxysmal anxiety] without agoraphobia: Secondary | ICD-10-CM | POA: Diagnosis not present

## 2024-07-25 DIAGNOSIS — F309 Manic episode, unspecified: Secondary | ICD-10-CM | POA: Diagnosis not present

## 2024-07-25 DIAGNOSIS — F39 Unspecified mood [affective] disorder: Secondary | ICD-10-CM | POA: Diagnosis not present

## 2024-08-19 DIAGNOSIS — Z0000 Encounter for general adult medical examination without abnormal findings: Secondary | ICD-10-CM | POA: Diagnosis not present
